# Patient Record
Sex: Female | Born: 1960 | Race: Black or African American | Hispanic: No | Marital: Single | Smoking: Former smoker
Health system: Southern US, Community
[De-identification: ages and names within clinical notes are randomized; demographics above are authoritative.]

## PROBLEM LIST (undated history)

## (undated) DIAGNOSIS — M549 Dorsalgia, unspecified: Secondary | ICD-10-CM

## (undated) DIAGNOSIS — I10 Essential (primary) hypertension: Secondary | ICD-10-CM

## (undated) DIAGNOSIS — E7800 Pure hypercholesterolemia, unspecified: Secondary | ICD-10-CM

## (undated) DIAGNOSIS — E119 Type 2 diabetes mellitus without complications: Secondary | ICD-10-CM

## (undated) HISTORY — PX: ABDOMINAL HYSTERECTOMY: SHX81

---

## 2011-05-11 DIAGNOSIS — M719 Bursopathy, unspecified: Secondary | ICD-10-CM

## 2011-05-11 HISTORY — DX: Bursopathy, unspecified: M71.9

## 2012-06-10 DIAGNOSIS — M47817 Spondylosis without myelopathy or radiculopathy, lumbosacral region: Secondary | ICD-10-CM

## 2012-06-10 DIAGNOSIS — F329 Major depressive disorder, single episode, unspecified: Secondary | ICD-10-CM

## 2012-06-10 DIAGNOSIS — G43909 Migraine, unspecified, not intractable, without status migrainosus: Secondary | ICD-10-CM

## 2012-06-10 DIAGNOSIS — M5137 Other intervertebral disc degeneration, lumbosacral region: Secondary | ICD-10-CM

## 2012-06-10 DIAGNOSIS — M159 Polyosteoarthritis, unspecified: Secondary | ICD-10-CM

## 2012-06-10 DIAGNOSIS — H905 Unspecified sensorineural hearing loss: Secondary | ICD-10-CM

## 2012-06-10 DIAGNOSIS — G5600 Carpal tunnel syndrome, unspecified upper limb: Secondary | ICD-10-CM

## 2012-06-10 DIAGNOSIS — F32A Depression, unspecified: Secondary | ICD-10-CM | POA: Insufficient documentation

## 2012-06-10 DIAGNOSIS — F341 Dysthymic disorder: Secondary | ICD-10-CM

## 2012-06-10 DIAGNOSIS — G4700 Insomnia, unspecified: Secondary | ICD-10-CM

## 2012-06-10 DIAGNOSIS — F411 Generalized anxiety disorder: Secondary | ICD-10-CM | POA: Insufficient documentation

## 2012-06-10 DIAGNOSIS — H8190 Unspecified disorder of vestibular function, unspecified ear: Secondary | ICD-10-CM

## 2012-06-10 DIAGNOSIS — M51379 Other intervertebral disc degeneration, lumbosacral region without mention of lumbar back pain or lower extremity pain: Secondary | ICD-10-CM

## 2012-06-10 DIAGNOSIS — M7650 Patellar tendinitis, unspecified knee: Secondary | ICD-10-CM | POA: Insufficient documentation

## 2012-06-10 DIAGNOSIS — M6530 Trigger finger, unspecified finger: Secondary | ICD-10-CM

## 2012-06-10 DIAGNOSIS — D126 Benign neoplasm of colon, unspecified: Secondary | ICD-10-CM

## 2012-06-10 DIAGNOSIS — M722 Plantar fascial fibromatosis: Secondary | ICD-10-CM

## 2012-06-10 DIAGNOSIS — Z79899 Other long term (current) drug therapy: Secondary | ICD-10-CM

## 2012-06-10 DIAGNOSIS — M79609 Pain in unspecified limb: Secondary | ICD-10-CM

## 2012-06-10 HISTORY — DX: Generalized anxiety disorder: F41.1

## 2012-06-10 HISTORY — DX: Major depressive disorder, single episode, unspecified: F32.9

## 2012-06-10 HISTORY — DX: Patellar tendinitis, unspecified knee: M76.50

## 2012-06-10 HISTORY — DX: Insomnia, unspecified: G47.00

## 2012-06-10 HISTORY — DX: Polyosteoarthritis, unspecified: M15.9

## 2012-06-10 HISTORY — DX: Benign neoplasm of colon, unspecified: D12.6

## 2012-06-10 HISTORY — DX: Spondylosis without myelopathy or radiculopathy, lumbosacral region: M47.817

## 2012-06-10 HISTORY — DX: Depression, unspecified: F32.A

## 2012-06-10 HISTORY — DX: Trigger finger, unspecified finger: M65.30

## 2012-06-10 HISTORY — DX: Dysthymic disorder: F34.1

## 2012-06-10 HISTORY — DX: Carpal tunnel syndrome, unspecified upper limb: G56.00

## 2012-06-10 HISTORY — DX: Plantar fascial fibromatosis: M72.2

## 2012-06-10 HISTORY — DX: Other long term (current) drug therapy: Z79.899

## 2012-06-10 HISTORY — DX: Migraine, unspecified, not intractable, without status migrainosus: G43.909

## 2012-06-10 HISTORY — DX: Unspecified disorder of vestibular function, unspecified ear: H81.90

## 2012-06-10 HISTORY — DX: Other intervertebral disc degeneration, lumbosacral region: M51.37

## 2012-06-10 HISTORY — DX: Other intervertebral disc degeneration, lumbosacral region without mention of lumbar back pain or lower extremity pain: M51.379

## 2012-06-10 HISTORY — DX: Unspecified sensorineural hearing loss: H90.5

## 2012-06-10 HISTORY — DX: Pain in unspecified limb: M79.609

## 2012-11-25 ENCOUNTER — Emergency Department (HOSPITAL_BASED_OUTPATIENT_CLINIC_OR_DEPARTMENT_OTHER): Payer: Medicare Other

## 2012-11-25 ENCOUNTER — Encounter (HOSPITAL_BASED_OUTPATIENT_CLINIC_OR_DEPARTMENT_OTHER): Payer: Self-pay | Admitting: Emergency Medicine

## 2012-11-25 ENCOUNTER — Emergency Department (HOSPITAL_BASED_OUTPATIENT_CLINIC_OR_DEPARTMENT_OTHER)
Admission: EM | Admit: 2012-11-25 | Discharge: 2012-11-25 | Disposition: A | Discharge status: Home / Self Care | Payer: Medicare Other | Attending: Emergency Medicine | Admitting: Emergency Medicine

## 2012-11-25 DIAGNOSIS — R0789 Other chest pain: Secondary | ICD-10-CM | POA: Insufficient documentation

## 2012-11-25 DIAGNOSIS — Z862 Personal history of diseases of the blood and blood-forming organs and certain disorders involving the immune mechanism: Secondary | ICD-10-CM | POA: Insufficient documentation

## 2012-11-25 DIAGNOSIS — F17200 Nicotine dependence, unspecified, uncomplicated: Secondary | ICD-10-CM | POA: Insufficient documentation

## 2012-11-25 DIAGNOSIS — Z8639 Personal history of other endocrine, nutritional and metabolic disease: Secondary | ICD-10-CM | POA: Insufficient documentation

## 2012-11-25 DIAGNOSIS — E119 Type 2 diabetes mellitus without complications: Secondary | ICD-10-CM | POA: Insufficient documentation

## 2012-11-25 DIAGNOSIS — R42 Dizziness and giddiness: Secondary | ICD-10-CM | POA: Insufficient documentation

## 2012-11-25 DIAGNOSIS — R110 Nausea: Secondary | ICD-10-CM | POA: Insufficient documentation

## 2012-11-25 HISTORY — DX: Type 2 diabetes mellitus without complications: E11.9

## 2012-11-25 HISTORY — DX: Pure hypercholesterolemia, unspecified: E78.00

## 2012-11-25 HISTORY — DX: Dorsalgia, unspecified: M54.9

## 2012-11-25 LAB — CBC WITH DIFFERENTIAL/PLATELET
Basophils Absolute: 0 10*3/uL (ref 0.0–0.1)
Eosinophils Absolute: 0.1 10*3/uL (ref 0.0–0.7)
Eosinophils Relative: 1 % (ref 0–5)
Lymphocytes Relative: 37 % (ref 12–46)
MCH: 30.5 pg (ref 26.0–34.0)
MCHC: 33.9 g/dL (ref 30.0–36.0)
MCV: 90 fL (ref 78.0–100.0)
Neutrophils Relative %: 55 % (ref 43–77)
Platelets: 201 10*3/uL (ref 150–400)
RBC: 4.3 MIL/uL (ref 3.87–5.11)
RDW: 14.6 % (ref 11.5–15.5)

## 2012-11-25 LAB — COMPREHENSIVE METABOLIC PANEL
ALT: 23 U/L (ref 0–35)
Albumin: 4.1 g/dL (ref 3.5–5.2)
BUN: 8 mg/dL (ref 6–23)
Creatinine, Ser: 0.9 mg/dL (ref 0.50–1.10)
GFR calc Af Amer: 84 mL/min — ABNORMAL LOW (ref >90)
Glucose, Bld: 89 mg/dL (ref 70–99)
Total Bilirubin: 0.3 mg/dL (ref 0.3–1.2)
Total Protein: 7.3 g/dL (ref 6.0–8.3)

## 2012-11-25 LAB — D-DIMER, QUANTITATIVE (NOT AT ARMC): D-Dimer, Quant: <0.27 ug/mL-FEU (ref 0.00–0.48)

## 2012-11-25 LAB — TROPONIN I: Troponin I: <0.3 ng/mL (ref <0.30)

## 2012-11-25 MED ORDER — NAPROXEN 500 MG PO TABS: 500.0000 mg | ORAL_TABLET | Freq: Two times a day (BID) | ORAL | Status: DC

## 2012-11-25 MED ORDER — SODIUM CHLORIDE 0.9 % IV BOLUS (SEPSIS)
1000.0000 mL | Freq: Once | INTRAVENOUS | Status: AC
  Administered 2012-11-25: 1000 mL via INTRAVENOUS

## 2012-11-25 NOTE — ED Provider Notes (Signed)
CSN: [id number]     Arrival date & time 11/25/12  1829 History  This chart was scribed for Sabrina B. Bernette Mayers, MD by Clydene Laming, ED Scribe. This patient was seen in room MH01/MH01 and the patient's care was started at [facility].   Chief Complaint  Patient presents with  . Chest Pain   HPI HPI Comments: Sabrina Swanson is a 52 y.o. female who presents to the Emergency Department complaining of intermittent right sided chest pain with associated nausea and light-headedness onset yesterday. Pt reports symptoms beginning while running errands. Pt went home and symptoms would go away but then return. Pt is not sure of any triggers. She states she has a hx of diabetes mellitus and has lost a few lbs recently. Pt ate just an hour before arrival and it did not improve or worsen the pain. She rates the pain right now as 5/10 and states it feels like someone is "sitting" on her chest. Pt travelled to [place] last weekend to attend a funeral. Pt does not have a hx of heart problems. She denies hypertension and sob. She has not had any gallbladder issues.   Past Medical History  Diagnosis Date  . High cholesterol   . Diabetes mellitus without complication    History reviewed. No pertinent past surgical history. No family history on file. History  Substance Use Topics  . Smoking status: Current Every Day Smoker -- 1.00 packs/day    Types: Cigarettes  . Smokeless tobacco: Not on file  . Alcohol Use: Yes     Comment: occasional   OB History   Grav Para Term Preterm Abortions TAB SAB Ect Mult Living                 Review of Systems  Cardiovascular: Positive for chest pain.  Gastrointestinal: Positive for nausea.  Neurological: Positive for light-headedness.  All other systems reviewed and are negative.    Allergies  Review of patient's allergies indicates no known allergies.  Home Medications   Current Outpatient Rx  Name  Route  Sig  Dispense  Refill  . UNKNOWN TO  PATIENT                There were no vitals taken for this visit. Physical Exam  Nursing note and vitals reviewed. Constitutional: She is oriented to person, place, and time. She appears well-developed and well-nourished.  HENT:  Head: Normocephalic and atraumatic.  Eyes: EOM are normal. Pupils are equal, round, and reactive to light.  Neck: Normal range of motion. Neck supple.  Cardiovascular: Normal rate, normal heart sounds and intact distal pulses.   Pulmonary/Chest: Effort normal and breath sounds normal.  Tenderness to right chest wall  Abdominal: Bowel sounds are normal. She exhibits no distension. There is no tenderness.  Musculoskeletal: Normal range of motion. She exhibits no edema and no tenderness.  Neurological: She is alert and oriented to person, place, and time. She has normal strength. No cranial nerve deficit or sensory deficit.  Skin: Skin is warm and dry. No rash noted.  Psychiatric: She has a normal mood and affect.    ED Course  Procedures (including critical care time) DIAGNOSTIC STUDIES: No vitals taken.  COORDINATION OF CARE: 6:48 PM- Discussed treatment plan with pt at bedside. Pt verbalized understanding and agreement with plan.   Labs Review Labs Reviewed  COMPREHENSIVE METABOLIC PANEL - Abnormal; Notable for the following:    Potassium 3.3 (*)    GFR calc non Af [name]  72 (*)    GFR calc Af Amer 84 (*)    All other components within normal limits  CBC WITH DIFFERENTIAL  TROPONIN I  D-DIMER, QUANTITATIVE   Imaging Review Dg Chest 2 View  11/25/2012   CLINICAL DATA:  Chest pain, shortness of breath.  EXAM: CHEST  2 VIEW  COMPARISON:  09/01/2012  FINDINGS: The heart size and mediastinal contours are within normal limits. Both lungs are clear. The visualized skeletal structures are unremarkable.  IMPRESSION: No active cardiopulmonary disease.   Electronically Signed   By: Charlett Nose M.D.   On: 11/25/2012 19:00    EKG Interpretation     Date/Time:  Saturday November 25 2012 18:39:15 EST Ventricular Rate:  68 PR Interval:  182 QRS Duration: 92 QT Interval:  406 QTC Calculation: 431 R Axis:   77 Text Interpretation:  Normal sinus rhythm Normal ECG No old tracing to compare Confirmed by [facility]  MD, Sabrina [phone]) on 11/25/2012 6:50:25 PM            MDM   1. Atypical chest pain     Pt with atypical, reproducible right sided chest pain, normal labs and EKG here. No concern for ACS or PE. Advised NSAIDs, rest and PCP followup.   I personally performed the services described in this documentation, which was scribed in my presence. The recorded information has been reviewed and is accurate.      Sabrina B. Bernette Mayers, MD 11/25/12 2201

## 2012-11-25 NOTE — ED Notes (Signed)
Patient has been experiencing intermittent episodes of right sided chest pain with nausea. Reports feeling light headed with same, non-radiating, no shortness of breath. No change with inspiration

## 2012-11-25 NOTE — ED Notes (Signed)
MD at bedside. 

## 2013-03-06 DIAGNOSIS — F17200 Nicotine dependence, unspecified, uncomplicated: Secondary | ICD-10-CM

## 2013-03-06 DIAGNOSIS — E7800 Pure hypercholesterolemia, unspecified: Secondary | ICD-10-CM

## 2013-03-06 HISTORY — DX: Nicotine dependence, unspecified, uncomplicated: F17.200

## 2013-03-06 HISTORY — DX: Pure hypercholesterolemia, unspecified: E78.00

## 2013-03-21 DIAGNOSIS — H9319 Tinnitus, unspecified ear: Secondary | ICD-10-CM | POA: Insufficient documentation

## 2013-03-21 HISTORY — DX: Tinnitus, unspecified ear: H93.19

## 2013-03-28 ENCOUNTER — Encounter (HOSPITAL_BASED_OUTPATIENT_CLINIC_OR_DEPARTMENT_OTHER): Payer: Self-pay | Admitting: Emergency Medicine

## 2013-03-28 ENCOUNTER — Emergency Department (HOSPITAL_BASED_OUTPATIENT_CLINIC_OR_DEPARTMENT_OTHER)
Admission: EM | Admit: 2013-03-28 | Discharge: 2013-03-28 | Disposition: A | Discharge status: Home / Self Care | Payer: Medicare Other | Attending: Emergency Medicine | Admitting: Emergency Medicine

## 2013-03-28 DIAGNOSIS — B9689 Other specified bacterial agents as the cause of diseases classified elsewhere: Secondary | ICD-10-CM | POA: Insufficient documentation

## 2013-03-28 DIAGNOSIS — Z8739 Personal history of other diseases of the musculoskeletal system and connective tissue: Secondary | ICD-10-CM | POA: Insufficient documentation

## 2013-03-28 DIAGNOSIS — Z79899 Other long term (current) drug therapy: Secondary | ICD-10-CM | POA: Insufficient documentation

## 2013-03-28 DIAGNOSIS — N760 Acute vaginitis: Secondary | ICD-10-CM | POA: Insufficient documentation

## 2013-03-28 DIAGNOSIS — Z792 Long term (current) use of antibiotics: Secondary | ICD-10-CM | POA: Insufficient documentation

## 2013-03-28 DIAGNOSIS — N390 Urinary tract infection, site not specified: Secondary | ICD-10-CM

## 2013-03-28 DIAGNOSIS — E7800 Pure hypercholesterolemia, unspecified: Secondary | ICD-10-CM | POA: Insufficient documentation

## 2013-03-28 DIAGNOSIS — Z791 Long term (current) use of non-steroidal anti-inflammatories (NSAID): Secondary | ICD-10-CM | POA: Insufficient documentation

## 2013-03-28 DIAGNOSIS — F17200 Nicotine dependence, unspecified, uncomplicated: Secondary | ICD-10-CM | POA: Insufficient documentation

## 2013-03-28 DIAGNOSIS — A499 Bacterial infection, unspecified: Secondary | ICD-10-CM | POA: Insufficient documentation

## 2013-03-28 DIAGNOSIS — E119 Type 2 diabetes mellitus without complications: Secondary | ICD-10-CM | POA: Insufficient documentation

## 2013-03-28 LAB — WET PREP, GENITAL
TRICH WET PREP: NONE SEEN
Yeast Wet Prep HPF POC: NONE SEEN

## 2013-03-28 LAB — URINE MICROSCOPIC-ADD ON

## 2013-03-28 LAB — URINALYSIS, ROUTINE W REFLEX MICROSCOPIC
Bilirubin Urine: NEGATIVE
Glucose, UA: NEGATIVE mg/dL
HGB URINE DIPSTICK: NEGATIVE
KETONES UR: NEGATIVE mg/dL
Nitrite: NEGATIVE
Protein, ur: NEGATIVE mg/dL
SPECIFIC GRAVITY, URINE: 1.019 (ref 1.005–1.030)
Urobilinogen, UA: 0.2 mg/dL (ref 0.0–1.0)
pH: 5 (ref 5.0–8.0)

## 2013-03-28 MED ORDER — AZITHROMYCIN 250 MG PO TABS
1000.0000 mg | ORAL_TABLET | Freq: Once | ORAL | Status: AC
  Administered 2013-03-28: 1000 mg via ORAL
  Filled 2013-03-28: qty 4

## 2013-03-28 MED ORDER — CEPHALEXIN 500 MG PO CAPS: 500.0000 mg | ORAL_CAPSULE | Freq: Four times a day (QID) | ORAL | Status: DC

## 2013-03-28 MED ORDER — METRONIDAZOLE 500 MG PO TABS: 500.0000 mg | ORAL_TABLET | Freq: Two times a day (BID) | ORAL | Status: DC

## 2013-03-28 MED ORDER — CEFTRIAXONE SODIUM 250 MG IJ SOLR
250.0000 mg | Freq: Once | INTRAMUSCULAR | Status: AC
  Administered 2013-03-28: 250 mg via INTRAMUSCULAR
  Filled 2013-03-28: qty 250

## 2013-03-28 NOTE — Discharge Instructions (Signed)
Bacterial Vaginosis °Bacterial vaginosis is a vaginal infection that occurs when the normal balance of bacteria in the vagina is disrupted. It results from an overgrowth of certain bacteria. This is the most common vaginal infection in women of childbearing age. Treatment is important to prevent complications, especially in pregnant women, as it can cause a premature delivery. °CAUSES  °Bacterial vaginosis is caused by an increase in harmful bacteria that are normally present in smaller amounts in the vagina. Several different kinds of bacteria can cause bacterial vaginosis. However, the reason that the condition develops is not fully understood. °RISK FACTORS °Certain activities or behaviors can put you at an increased risk of developing bacterial vaginosis, including: °· Having a new sex partner or multiple sex partners. °· Douching. °· Using an intrauterine device (IUD) for contraception. °Women do not get bacterial vaginosis from toilet seats, bedding, swimming pools, or contact with objects around them. °SIGNS AND SYMPTOMS  °Some women with bacterial vaginosis have no signs or symptoms. Common symptoms include: °· Grey vaginal discharge. °· A fishlike odor with discharge, especially after sexual intercourse. °· Itching or burning of the vagina and vulva. °· Burning or pain with urination. °DIAGNOSIS  °Your health care provider will take a medical history and examine the vagina for signs of bacterial vaginosis. A sample of vaginal fluid may be taken. Your health care provider will look at this sample under a microscope to check for bacteria and abnormal cells. A vaginal pH test may also be done.  °TREATMENT  °Bacterial vaginosis may be treated with antibiotic medicines. These may be given in the form of a pill or a vaginal cream. A second round of antibiotics may be prescribed if the condition comes back after treatment.  °HOME CARE INSTRUCTIONS  °· Only take over-the-counter or prescription medicines as  directed by your health care provider. °· If antibiotic medicine was prescribed, take it as directed. Make sure you finish it even if you start to feel better. °· Do not have sex until treatment is completed. °· Tell all sexual partners that you have a vaginal infection. They should see their health care provider and be treated if they have problems, such as a mild rash or itching. °· Practice safe sex by using condoms and only having one sex partner. °SEEK MEDICAL CARE IF:  °· Your symptoms are not improving after 3 days of treatment. °· You have increased discharge or pain. °· You have a fever. °MAKE SURE YOU:  °· Understand these instructions. °· Will watch your condition. °· Will get help right away if you are not doing well or get worse. °FOR MORE INFORMATION  °Centers for Disease Control and Prevention, Division of STD Prevention: www.cdc.gov/std °American Sexual Health Association (ASHA): www.ashastd.org  °Document Released: 12/21/2004 Document Revised: 10/11/2012 Document Reviewed: 08/02/2012 °ExitCare® Patient Information ©2014 [employer]. °Urinary Tract Infection °Urinary tract infections (UTIs) can develop anywhere along your urinary tract. Your urinary tract is your body's drainage system for removing wastes and extra water. Your urinary tract includes two kidneys, two ureters, a bladder, and a urethra. Your kidneys are a pair of bean-shaped organs. Each kidney is about the size of your fist. They are located below your ribs, one on each side of your spine. °CAUSES °Infections are caused by microbes, which are microscopic organisms, including fungi, viruses, and bacteria. These organisms are so small that they can only be seen through a microscope. Bacteria are the microbes that most commonly cause UTIs. °SYMPTOMS  °Symptoms of UTIs   may vary by age and gender of the patient and by the location of the infection. Symptoms in young women typically include a frequent and intense urge to urinate and a  painful, burning feeling in the bladder or urethra during urination. Older women and men are more likely to be tired, shaky, and weak and have muscle aches and abdominal pain. A fever may mean the infection is in your kidneys. Other symptoms of a kidney infection include pain in your back or sides below the ribs, nausea, and vomiting. °DIAGNOSIS °To diagnose a UTI, your caregiver will ask you about your symptoms. Your caregiver also will ask to provide a urine sample. The urine sample will be tested for bacteria and white blood cells. White blood cells are made by your body to help fight infection. °TREATMENT  °Typically, UTIs can be treated with medication. Because most UTIs are caused by a bacterial infection, they usually can be treated with the use of antibiotics. The choice of antibiotic and length of treatment depend on your symptoms and the type of bacteria causing your infection. °HOME CARE INSTRUCTIONS °· If you were prescribed antibiotics, take them exactly as your caregiver instructs you. Finish the medication even if you feel better after you have only taken some of the medication. °· Drink enough water and fluids to keep your urine clear or pale yellow. °· Avoid caffeine, tea, and carbonated beverages. They tend to irritate your bladder. °· Empty your bladder often. Avoid holding urine for long periods of time. °· Empty your bladder before and after sexual intercourse. °· After a bowel movement, women should cleanse from front to back. Use each tissue only once. °SEEK MEDICAL CARE IF:  °· You have back pain. °· You develop a fever. °· Your symptoms do not begin to resolve within 3 days. °SEEK IMMEDIATE MEDICAL CARE IF:  °· You have severe back pain or lower abdominal pain. °· You develop chills. °· You have nausea or vomiting. °· You have continued burning or discomfort with urination. °MAKE SURE YOU:  °· Understand these instructions. °· Will watch your condition. °· Will get help right away if you are  not doing well or get worse. °Document Released: 09/30/2004 Document Revised: 06/22/2011 Document Reviewed: 01/29/2011 °ExitCare® Patient Information ©2014 [employer]. ° °

## 2013-03-28 NOTE — ED Notes (Signed)
Provided pt with Drink and Snack

## 2013-03-28 NOTE — ED Notes (Signed)
Pt c/o suprapubic pressure and vaginal discharge x 3 days.

## 2013-03-28 NOTE — ED Provider Notes (Addendum)
CSN: [id number]     Arrival date & time 03/28/13  4098 History   First MD Initiated Contact with Patient 03/28/13 1014     Chief Complaint  Patient presents with  . Vaginal Discharge     (Consider location/radiation/quality/duration/timing/severity/associated sxs/prior Treatment) HPI Comments: The patient has been experiencing yellow-brown vaginal discharge for 3 days. She also reports that she has been experiencing urinary frequency with mild dysuria. She denies back pain, nausea, vomiting, fever.  Patient is a 53 y.o. female presenting with vaginal discharge.  Vaginal Discharge Associated symptoms: dysuria     Past Medical History  Diagnosis Date  . High cholesterol   . Diabetes mellitus without complication   . Back pain    History reviewed. No pertinent past surgical history. No family history on file. History  Substance Use Topics  . Smoking status: Current Every Day Smoker -- 1.00 packs/day    Types: Cigarettes  . Smokeless tobacco: Not on file  . Alcohol Use: Yes     Comment: occasional   OB History   Grav Para Term Preterm Abortions TAB SAB Ect Mult Living                 Review of Systems  Genitourinary: Positive for dysuria, frequency and vaginal discharge.  All other systems reviewed and are negative.      Allergies  Review of patient's allergies indicates no known allergies.  Home Medications   Current Outpatient Rx  Name  Route  Sig  Dispense  Refill  . OxyCODONE (OXYCONTIN) 10 mg T12A 12 hr tablet   Oral   Take 10 mg by mouth every 12 (twelve) hours.         . pravastatin (PRAVACHOL) 80 MG tablet   Oral   Take 80 mg by mouth daily.         . cephALEXin (KEFLEX) 500 MG capsule   Oral   Take 1 capsule (500 mg total) by mouth 4 (four) times daily.   28 capsule   0   . metroNIDAZOLE (FLAGYL) 500 MG tablet   Oral   Take 1 tablet (500 mg total) by mouth 2 (two) times daily. One po bid x 7 days   14 tablet   0   . naproxen (NAPROSYN)  500 MG tablet   Oral   Take 1 tablet (500 mg total) by mouth 2 (two) times daily.   30 tablet   0   . oxyCODONE-acetaminophen (PERCOCET/ROXICET) 5-325 MG per tablet   Oral   Take 1 tablet by mouth every 4 (four) hours as needed for severe pain.         Marland Kitchen UNKNOWN TO PATIENT                BP 139/91  Pulse 77  Temp(Src) 98.1 F (36.7 C) (Oral)  Resp 18  SpO2 100% Physical Exam  Constitutional: She is oriented to person, place, and time. She appears well-developed and well-nourished. No distress.  HENT:  Head: Normocephalic and atraumatic.  Right Ear: Hearing normal.  Left Ear: Hearing normal.  Nose: Nose normal.  Mouth/Throat: Oropharynx is clear and moist and mucous membranes are normal.  Eyes: Conjunctivae and EOM are normal. Pupils are equal, round, and reactive to light.  Neck: Normal range of motion. Neck supple.  Cardiovascular: Regular rhythm, S1 normal and S2 normal.  Exam reveals no gallop and no friction rub.   No murmur heard. Pulmonary/Chest: Effort normal and breath sounds normal. No respiratory distress.  She exhibits no tenderness.  Abdominal: Soft. Normal appearance and bowel sounds are normal. There is no hepatosplenomegaly. There is no tenderness. There is no rebound, no guarding, no tenderness at McBurney's point and negative Murphy's sign. No hernia.  Genitourinary: Cervix exhibits discharge. Left adnexum displays mass and tenderness.  Musculoskeletal: Normal range of motion.  Neurological: She is alert and oriented to person, place, and time. She has normal strength. No cranial nerve deficit or sensory deficit. Coordination normal. GCS eye subscore is 4. GCS verbal subscore is 5. GCS motor subscore is 6.  Skin: Skin is warm, dry and intact. No rash noted. No cyanosis.  Psychiatric: She has a normal mood and affect. Her speech is normal and behavior is normal. Thought content normal.    ED Course  Procedures (including critical care time) Labs  Review Labs Reviewed  WET PREP, GENITAL - Abnormal; Notable for the following:    Clue Cells Wet Prep HPF POC FEW (*)    WBC, Wet Prep HPF POC MANY (*)    All other components within normal limits  URINALYSIS, ROUTINE W REFLEX MICROSCOPIC - Abnormal; Notable for the following:    Leukocytes, UA MODERATE (*)    All other components within normal limits  URINE MICROSCOPIC-ADD ON - Abnormal; Notable for the following:    Bacteria, UA MANY (*)    All other components within normal limits  GC/CHLAMYDIA PROBE AMP   Imaging Review No results found.   EKG Interpretation None      MDM   Final diagnoses:  UTI (lower urinary tract infection)  Bacterial vaginosis    Patient presents with dysuria as well as pelvic discharge. Dialysis is consistent with infection. GC and Chlamydia are pending. Patient treated empirically with Rocephin and Zithromax. We'll also treat with Flagyl for clue cells and Keflex for the UTI.    Orpah Greek, MD 03/28/13 [place], MD 03/28/13 [phone]

## 2013-03-29 LAB — GC/CHLAMYDIA PROBE AMP
CT Probe RNA: NEGATIVE
GC Probe RNA: NEGATIVE

## 2013-05-10 ENCOUNTER — Encounter (HOSPITAL_BASED_OUTPATIENT_CLINIC_OR_DEPARTMENT_OTHER): Payer: Self-pay | Admitting: Emergency Medicine

## 2013-05-10 ENCOUNTER — Emergency Department (HOSPITAL_BASED_OUTPATIENT_CLINIC_OR_DEPARTMENT_OTHER)
Admission: EM | Admit: 2013-05-10 | Discharge: 2013-05-11 | Disposition: A | Discharge status: Home / Self Care | Payer: Medicare Other | Attending: Emergency Medicine | Admitting: Emergency Medicine

## 2013-05-10 ENCOUNTER — Emergency Department (HOSPITAL_COMMUNITY): Payer: Medicare Other

## 2013-05-10 DIAGNOSIS — H5500 Unspecified nystagmus: Secondary | ICD-10-CM | POA: Insufficient documentation

## 2013-05-10 DIAGNOSIS — F17200 Nicotine dependence, unspecified, uncomplicated: Secondary | ICD-10-CM | POA: Diagnosis not present

## 2013-05-10 DIAGNOSIS — R112 Nausea with vomiting, unspecified: Secondary | ICD-10-CM | POA: Diagnosis not present

## 2013-05-10 DIAGNOSIS — E119 Type 2 diabetes mellitus without complications: Secondary | ICD-10-CM | POA: Insufficient documentation

## 2013-05-10 DIAGNOSIS — M549 Dorsalgia, unspecified: Secondary | ICD-10-CM | POA: Diagnosis not present

## 2013-05-10 DIAGNOSIS — H9319 Tinnitus, unspecified ear: Secondary | ICD-10-CM | POA: Insufficient documentation

## 2013-05-10 DIAGNOSIS — R0789 Other chest pain: Secondary | ICD-10-CM | POA: Insufficient documentation

## 2013-05-10 DIAGNOSIS — Z79899 Other long term (current) drug therapy: Secondary | ICD-10-CM | POA: Insufficient documentation

## 2013-05-10 DIAGNOSIS — I6789 Other cerebrovascular disease: Secondary | ICD-10-CM | POA: Diagnosis not present

## 2013-05-10 DIAGNOSIS — E7800 Pure hypercholesterolemia, unspecified: Secondary | ICD-10-CM | POA: Diagnosis not present

## 2013-05-10 DIAGNOSIS — R42 Dizziness and giddiness: Secondary | ICD-10-CM | POA: Diagnosis not present

## 2013-05-10 LAB — CBC WITH DIFFERENTIAL/PLATELET
BASOS PCT: 0 % (ref 0–1)
Basophils Absolute: 0 10*3/uL (ref 0.0–0.1)
EOS ABS: 0.1 10*3/uL (ref 0.0–0.7)
Eosinophils Relative: 1 % (ref 0–5)
HEMATOCRIT: 39.4 % (ref 36.0–46.0)
HEMOGLOBIN: 13.4 g/dL (ref 12.0–15.0)
Lymphocytes Relative: 55 % — ABNORMAL HIGH (ref 12–46)
Lymphs Abs: 3.2 10*3/uL (ref 0.7–4.0)
MCH: 31.2 pg (ref 26.0–34.0)
MCHC: 34 g/dL (ref 30.0–36.0)
MCV: 91.8 fL (ref 78.0–100.0)
MONO ABS: 0.4 10*3/uL (ref 0.1–1.0)
MONOS PCT: 6 % (ref 3–12)
Neutro Abs: 2.2 10*3/uL (ref 1.7–7.7)
Neutrophils Relative %: 37 % — ABNORMAL LOW (ref 43–77)
Platelets: 190 10*3/uL (ref 150–400)
RBC: 4.29 MIL/uL (ref 3.87–5.11)
RDW: 14.6 % (ref 11.5–15.5)
WBC: 5.8 10*3/uL (ref 4.0–10.5)

## 2013-05-10 LAB — URINALYSIS, ROUTINE W REFLEX MICROSCOPIC
Bilirubin Urine: NEGATIVE
Glucose, UA: NEGATIVE mg/dL
Hgb urine dipstick: NEGATIVE
KETONES UR: NEGATIVE mg/dL
NITRITE: NEGATIVE
PH: 8 (ref 5.0–8.0)
Protein, ur: NEGATIVE mg/dL
Specific Gravity, Urine: 1.012 (ref 1.005–1.030)
Urobilinogen, UA: 0.2 mg/dL (ref 0.0–1.0)

## 2013-05-10 LAB — URINE MICROSCOPIC-ADD ON

## 2013-05-10 LAB — BASIC METABOLIC PANEL
BUN: 10 mg/dL (ref 6–23)
CO2: 28 mEq/L (ref 19–32)
CREATININE: 0.8 mg/dL (ref 0.50–1.10)
Calcium: 9.1 mg/dL (ref 8.4–10.5)
Chloride: 105 mEq/L (ref 96–112)
GFR, EST NON AFRICAN AMERICAN: 83 mL/min — AB (ref >90)
Glucose, Bld: 115 mg/dL — ABNORMAL HIGH (ref 70–99)
Potassium: 4 mEq/L (ref 3.7–5.3)
Sodium: 144 mEq/L (ref 137–147)

## 2013-05-10 MED ORDER — DIAZEPAM 5 MG/ML IJ SOLN
5.0000 mg | Freq: Once | INTRAMUSCULAR | Status: AC
  Administered 2013-05-10: 5 mg via INTRAVENOUS
  Filled 2013-05-10: qty 2

## 2013-05-10 NOTE — ED Notes (Signed)
Unable to void at present time. 

## 2013-05-10 NOTE — ED Notes (Signed)
Patient transported to MRI 

## 2013-05-10 NOTE — ED Notes (Signed)
MRI dept notified of Pt. Arrival to POD -C.

## 2013-05-10 NOTE — ED Provider Notes (Signed)
CSN: [id number]     Arrival date & time 05/10/13  1456 History   First MD Initiated Contact with Patient 05/10/13 1555     Chief Complaint  Patient presents with  . Dizziness     (Consider location/radiation/quality/duration/timing/severity/associated sxs/prior Treatment) Patient is a 53 y.o. female presenting with dizziness.  Dizziness Quality:  Room spinning, vertigo and imbalance Severity:  Moderate Onset quality:  Gradual Duration:  3 days Timing:  Constant Progression:  Unchanged Chronicity:  New Context comment:  Began initially while working in her garden two days ago.  she went to the ED    Relieved by: meclizine, but has not tried any today. Worsened by:  Standing up Associated symptoms: nausea, tinnitus (left ear, mild) and vomiting (two days ago)   Associated symptoms: no chest pain and no shortness of breath     Past Medical History  Diagnosis Date  . High cholesterol   . Diabetes mellitus without complication   . Back pain    Past Surgical History  Procedure Laterality Date  . Abdominal hysterectomy    . Cesarean section     No family history on file. History  Substance Use Topics  . Smoking status: Current Every Day Smoker -- 1.00 packs/day    Types: Cigarettes  . Smokeless tobacco: Not on file  . Alcohol Use: Yes     Comment: occasional   OB History   Grav Para Term Preterm Abortions TAB SAB Ect Mult Living                 Review of Systems  HENT: Positive for tinnitus (left ear, mild).   Respiratory: Negative for shortness of breath.   Cardiovascular: Negative for chest pain.  Gastrointestinal: Positive for nausea and vomiting (two days ago).  Neurological: Positive for dizziness.  All other systems reviewed and are negative.     Allergies  Review of patient's allergies indicates no known allergies.  Home Medications   Prior to Admission medications   Medication Sig Start Date End Date Taking? Authorizing Provider  OxyCODONE  (OXYCONTIN) 10 mg T12A 12 hr tablet Take 10 mg by mouth every 12 (twelve) hours.   Yes Historical Provider, MD  oxyCODONE-acetaminophen (PERCOCET/ROXICET) 5-325 MG per tablet Take 1 tablet by mouth every 4 (four) hours as needed for severe pain.   Yes Historical Provider, MD  cephALEXin (KEFLEX) 500 MG capsule Take 1 capsule (500 mg total) by mouth 4 (four) times daily. 03/28/13   Orpah Greek, MD  metroNIDAZOLE (FLAGYL) 500 MG tablet Take 1 tablet (500 mg total) by mouth 2 (two) times daily. One po bid x 7 days 03/28/13   Orpah Greek, MD  naproxen (NAPROSYN) 500 MG tablet Take 1 tablet (500 mg total) by mouth 2 (two) times daily. 11/25/12   Charles B. Karle Starch, MD  pravastatin (PRAVACHOL) 80 MG tablet Take 80 mg by mouth daily.    Historical Provider, MD  UNKNOWN TO PATIENT     Historical Provider, MD   BP 182/81  Pulse 70  Temp(Src) 98.7 F (37.1 C) (Oral)  Resp 18  Wt 160 lb (72.576 kg)  SpO2 98% Physical Exam  Nursing note and vitals reviewed. Constitutional: She is oriented to person, place, and time. She appears well-developed and well-nourished. No distress.  HENT:  Head: Normocephalic and atraumatic.  Mouth/Throat: Oropharynx is clear and moist.  Eyes: Conjunctivae are normal. Pupils are equal, round, and reactive to light. No scleral icterus.  Neck: Neck supple.  Cardiovascular:  Normal rate, regular rhythm, normal heart sounds and intact distal pulses.   No murmur heard. Pulmonary/Chest: Effort normal and breath sounds normal. No stridor. No respiratory distress. She has no rales.  Abdominal: Soft. Bowel sounds are normal. She exhibits no distension. There is no tenderness.  Musculoskeletal: Normal range of motion.  Neurological: She is alert and oriented to person, place, and time. She has normal strength. No cranial nerve deficit or sensory deficit. Gait (ataxic.) abnormal. Coordination (normal FTN, no truncal ataxia) normal. GCS eye subscore is 4. GCS verbal  subscore is 5. GCS motor subscore is 6.  Right sided nystagmus  Skin: Skin is warm and dry. No rash noted.  Psychiatric: She has a normal mood and affect. Her behavior is normal.    ED Course  Procedures (including critical care time) Labs Review Labs Reviewed  CBC WITH DIFFERENTIAL - Abnormal; Notable for the following:    Neutrophils Relative % 37 (*)    Lymphocytes Relative 55 (*)    All other components within normal limits  BASIC METABOLIC PANEL - Abnormal; Notable for the following:    Glucose, Bld 115 (*)    GFR calc non Af Amer 83 (*)    All other components within normal limits  URINALYSIS, ROUTINE W REFLEX MICROSCOPIC - Abnormal; Notable for the following:    Leukocytes, UA TRACE (*)    All other components within normal limits  URINE MICROSCOPIC-ADD ON    Imaging Review No results found.   EKG Interpretation None      MDM   Final diagnoses:  Vertigo    53 year old female presenting with room spinning sensation and feeling of being off balance. Symptoms started several days ago, and have persisted. Her neurologic examination notable for ataxia. Her ataxia and feeling of off balance did not improve after IV Valium. Due to duration of symptoms and persistence of symptoms despite medication, feels she needs MRI to rule out central vertigo. Transferred from here to [facility] for this test.    Arbie Cookey, MD 05/11/13 [phone]

## 2013-05-10 NOTE — ED Notes (Signed)
Dizziness onset started Tuesday after mowing grass. Pt seen at [facility] Tuesday and given medication for dizziness.pt states now having feelings of being off balance.

## 2013-05-11 MED ORDER — ONDANSETRON 8 MG PO TBDP: 8.0000 mg | ORAL_TABLET | Freq: Three times a day (TID) | ORAL | Status: DC | PRN

## 2013-05-11 MED ORDER — DIAZEPAM 5 MG PO TABS: 5.0000 mg | ORAL_TABLET | Freq: Three times a day (TID) | ORAL | Status: DC | PRN

## 2013-05-11 NOTE — ED Provider Notes (Signed)
12:51 AM Patient doing much better.  Home with Valium.  This is likely peripheral vertigo.  MRI without acute stroke or other significant abnormalities.  Patient was ambulatory in the ER.  Mr Angiogram Head Wo Contrast  05/11/2013   CLINICAL DATA:  Intermittent right chest pain with associated nausea and dizziness.  EXAM: MRI HEAD WITHOUT CONTRAST  MRA HEAD WITHOUT CONTRAST  TECHNIQUE: Multiplanar, multiecho pulse sequences of the brain and surrounding structures were obtained without intravenous contrast. Angiographic images of the head were obtained using MRA technique without contrast.  COMPARISON:  Prior CT performed earlier on the same day.  FINDINGS: MRI HEAD FINDINGS  The CSF containing spaces are within normal limits for patient age. Scattered subcentimeter T2/FLAIR hyperintensities seen within the deep white matter of both cerebral hemispheres, nonspecific, but likely related to mild chronic microvascular ischemic disease. No mass lesion, midline shift, or extra-axial fluid collection. Ventricles are normal in size without evidence of hydrocephalus.  No diffusion-weighted signal abnormality is identified to suggest acute intracranial infarct. Gray-white matter differentiation is maintained. Normal flow voids are seen within the intracranial vasculature. No intracranial hemorrhage identified.  The cervicomedullary junction is normal. Pituitary gland is within normal limits. Pituitary stalk is midline. The globes and optic nerves demonstrate a normal appearance with normal signal intensity.  The bone marrow signal intensity is normal. Calvarium is intact. Visualized upper cervical spine is within normal limits.  Scalp soft tissues are unremarkable.  Paranasal sinuses are clear.  No mastoid effusion.  MRA HEAD FINDINGS  The visualized portions of the distal cervical internal carotid arteries are widely patent with antegrade flow. The petrous, cavernous, and supra clinoid segments are symmetric in caliber  bilaterally. A1 segments, anterior communicating artery, and anterior cerebral arteries are widely patent. Middle cerebral arteries are widely patent with antegrade flow without high-grade flow-limiting stenosis or proximal branch occlusion. Distal MCA branches are symmetric bilaterally. No intracranial aneurysm within the anterior circulation.  The vertebral arteries are widely patent with antegrade flow. The posterior inferior cerebral arteries are normal. Vertebrobasilar junction and basilar artery are widely patent with antegrade flow without evidence of basilar tip stenosis or aneurysm. Posterior cerebral arteries are normal bilaterally. The superior cerebellar arteries and anterior inferior cerebellar arteries are widely patent bilaterally. No intracranial aneurysm within the posterior circulation.  IMPRESSION: MRI HEAD IMPRESSION:  1. No acute intracranial infarct or other abnormality identified. 2. Nonspecific T2/FLAIR hyperintensities involving the deep white matter of both cerebral hemispheres, nonspecific, but likely related to mild chronic microvascular ischemic disease.  MRA HEAD IMPRESSION:  Normal MRA of the brain.   Electronically Signed   By: Jeannine Boga M.D.   On: 05/11/2013 00:10   Mr Brain Wo Contrast  05/11/2013   CLINICAL DATA:  Intermittent right chest pain with associated nausea and dizziness.  EXAM: MRI HEAD WITHOUT CONTRAST  MRA HEAD WITHOUT CONTRAST  TECHNIQUE: Multiplanar, multiecho pulse sequences of the brain and surrounding structures were obtained without intravenous contrast. Angiographic images of the head were obtained using MRA technique without contrast.  COMPARISON:  Prior CT performed earlier on the same day.  FINDINGS: MRI HEAD FINDINGS  The CSF containing spaces are within normal limits for patient age. Scattered subcentimeter T2/FLAIR hyperintensities seen within the deep white matter of both cerebral hemispheres, nonspecific, but likely related to mild chronic  microvascular ischemic disease. No mass lesion, midline shift, or extra-axial fluid collection. Ventricles are normal in size without evidence of hydrocephalus.  No diffusion-weighted signal abnormality is identified to  suggest acute intracranial infarct. Gray-white matter differentiation is maintained. Normal flow voids are seen within the intracranial vasculature. No intracranial hemorrhage identified.  The cervicomedullary junction is normal. Pituitary gland is within normal limits. Pituitary stalk is midline. The globes and optic nerves demonstrate a normal appearance with normal signal intensity.  The bone marrow signal intensity is normal. Calvarium is intact. Visualized upper cervical spine is within normal limits.  Scalp soft tissues are unremarkable.  Paranasal sinuses are clear.  No mastoid effusion.  MRA HEAD FINDINGS  The visualized portions of the distal cervical internal carotid arteries are widely patent with antegrade flow. The petrous, cavernous, and supra clinoid segments are symmetric in caliber bilaterally. A1 segments, anterior communicating artery, and anterior cerebral arteries are widely patent. Middle cerebral arteries are widely patent with antegrade flow without high-grade flow-limiting stenosis or proximal branch occlusion. Distal MCA branches are symmetric bilaterally. No intracranial aneurysm within the anterior circulation.  The vertebral arteries are widely patent with antegrade flow. The posterior inferior cerebral arteries are normal. Vertebrobasilar junction and basilar artery are widely patent with antegrade flow without evidence of basilar tip stenosis or aneurysm. Posterior cerebral arteries are normal bilaterally. The superior cerebellar arteries and anterior inferior cerebellar arteries are widely patent bilaterally. No intracranial aneurysm within the posterior circulation.  IMPRESSION: MRI HEAD IMPRESSION:  1. No acute intracranial infarct or other abnormality identified. 2.  Nonspecific T2/FLAIR hyperintensities involving the deep white matter of both cerebral hemispheres, nonspecific, but likely related to mild chronic microvascular ischemic disease.  MRA HEAD IMPRESSION:  Normal MRA of the brain.   Electronically Signed   By: Jeannine Boga M.D.   On: 05/11/2013 00:10  I personally reviewed the imaging tests through PACS system I reviewed available ER/hospitalization records through the [place], MD 05/11/13 [phone]

## 2013-05-11 NOTE — Discharge Instructions (Signed)

## 2013-05-11 NOTE — ED Notes (Signed)
Patient ambulated with no dizziness

## 2013-05-16 DIAGNOSIS — L659 Nonscarring hair loss, unspecified: Secondary | ICD-10-CM

## 2013-05-16 DIAGNOSIS — J4489 Other specified chronic obstructive pulmonary disease: Secondary | ICD-10-CM

## 2013-05-16 DIAGNOSIS — J449 Chronic obstructive pulmonary disease, unspecified: Secondary | ICD-10-CM

## 2013-05-16 HISTORY — DX: Nonscarring hair loss, unspecified: L65.9

## 2013-05-16 HISTORY — DX: Other specified chronic obstructive pulmonary disease: J44.89

## 2013-05-16 HISTORY — DX: Chronic obstructive pulmonary disease, unspecified: J44.9

## 2013-06-14 DIAGNOSIS — R42 Dizziness and giddiness: Secondary | ICD-10-CM

## 2013-06-14 HISTORY — DX: Dizziness and giddiness: R42

## 2014-03-04 DIAGNOSIS — IMO0002 Reserved for concepts with insufficient information to code with codable children: Secondary | ICD-10-CM | POA: Insufficient documentation

## 2014-03-04 HISTORY — DX: Reserved for concepts with insufficient information to code with codable children: IMO0002

## 2014-05-25 DIAGNOSIS — D72820 Lymphocytosis (symptomatic): Secondary | ICD-10-CM

## 2014-05-25 HISTORY — DX: Lymphocytosis (symptomatic): D72.820

## 2014-10-21 DIAGNOSIS — E1140 Type 2 diabetes mellitus with diabetic neuropathy, unspecified: Secondary | ICD-10-CM

## 2014-10-21 HISTORY — DX: Type 2 diabetes mellitus with diabetic neuropathy, unspecified: E11.40

## 2014-10-23 DIAGNOSIS — R229 Localized swelling, mass and lump, unspecified: Secondary | ICD-10-CM | POA: Insufficient documentation

## 2014-10-23 DIAGNOSIS — M79674 Pain in right toe(s): Secondary | ICD-10-CM

## 2014-10-23 DIAGNOSIS — M79675 Pain in left toe(s): Secondary | ICD-10-CM | POA: Insufficient documentation

## 2014-10-23 DIAGNOSIS — R200 Anesthesia of skin: Secondary | ICD-10-CM | POA: Insufficient documentation

## 2014-10-23 DIAGNOSIS — M2041 Other hammer toe(s) (acquired), right foot: Secondary | ICD-10-CM | POA: Insufficient documentation

## 2014-10-23 HISTORY — DX: Pain in left toe(s): M79.675

## 2014-10-23 HISTORY — DX: Pain in right toe(s): M79.674

## 2014-10-23 HISTORY — DX: Other hammer toe(s) (acquired), right foot: M20.41

## 2014-10-23 HISTORY — DX: Anesthesia of skin: R20.0

## 2014-10-23 HISTORY — DX: Localized swelling, mass and lump, unspecified: R22.9

## 2015-03-05 DIAGNOSIS — Z9181 History of falling: Secondary | ICD-10-CM

## 2015-03-05 HISTORY — DX: History of falling: Z91.81

## 2015-06-30 DIAGNOSIS — H0011 Chalazion right upper eyelid: Secondary | ICD-10-CM

## 2015-06-30 DIAGNOSIS — H269 Unspecified cataract: Secondary | ICD-10-CM

## 2015-06-30 HISTORY — DX: Chalazion right upper eyelid: H00.11

## 2015-06-30 HISTORY — DX: Unspecified cataract: H26.9

## 2015-07-18 DIAGNOSIS — N761 Subacute and chronic vaginitis: Secondary | ICD-10-CM

## 2015-07-18 HISTORY — DX: Subacute and chronic vaginitis: N76.1

## 2018-11-27 DIAGNOSIS — M67911 Unspecified disorder of synovium and tendon, right shoulder: Secondary | ICD-10-CM

## 2018-11-27 DIAGNOSIS — M7541 Impingement syndrome of right shoulder: Secondary | ICD-10-CM

## 2018-11-27 DIAGNOSIS — M19011 Primary osteoarthritis, right shoulder: Secondary | ICD-10-CM | POA: Insufficient documentation

## 2018-11-27 DIAGNOSIS — M25811 Other specified joint disorders, right shoulder: Secondary | ICD-10-CM

## 2018-11-27 HISTORY — DX: Other specified joint disorders, right shoulder: M25.811

## 2018-11-27 HISTORY — DX: Primary osteoarthritis, right shoulder: M19.011

## 2018-11-27 HISTORY — DX: Unspecified disorder of synovium and tendon, right shoulder: M67.911

## 2018-11-27 HISTORY — DX: Impingement syndrome of right shoulder: M75.41

## 2019-08-14 DIAGNOSIS — I10 Essential (primary) hypertension: Secondary | ICD-10-CM

## 2019-08-14 HISTORY — DX: Essential (primary) hypertension: I10

## 2019-10-28 ENCOUNTER — Other Ambulatory Visit: Payer: Self-pay

## 2019-10-28 ENCOUNTER — Emergency Department (HOSPITAL_BASED_OUTPATIENT_CLINIC_OR_DEPARTMENT_OTHER): Payer: Medicare Other

## 2019-10-28 ENCOUNTER — Encounter (HOSPITAL_BASED_OUTPATIENT_CLINIC_OR_DEPARTMENT_OTHER): Payer: Self-pay | Admitting: Emergency Medicine

## 2019-10-28 ENCOUNTER — Emergency Department (HOSPITAL_BASED_OUTPATIENT_CLINIC_OR_DEPARTMENT_OTHER)
Admission: EM | Admit: 2019-10-28 | Discharge: 2019-10-28 | Disposition: A | Discharge status: Home / Self Care | Payer: Medicare Other | Attending: Emergency Medicine | Admitting: Emergency Medicine

## 2019-10-28 DIAGNOSIS — Z7984 Long term (current) use of oral hypoglycemic drugs: Secondary | ICD-10-CM | POA: Insufficient documentation

## 2019-10-28 DIAGNOSIS — F17210 Nicotine dependence, cigarettes, uncomplicated: Secondary | ICD-10-CM | POA: Insufficient documentation

## 2019-10-28 DIAGNOSIS — I10 Essential (primary) hypertension: Secondary | ICD-10-CM | POA: Diagnosis not present

## 2019-10-28 DIAGNOSIS — E1169 Type 2 diabetes mellitus with other specified complication: Secondary | ICD-10-CM | POA: Diagnosis not present

## 2019-10-28 DIAGNOSIS — E785 Hyperlipidemia, unspecified: Secondary | ICD-10-CM | POA: Insufficient documentation

## 2019-10-28 DIAGNOSIS — R0789 Other chest pain: Secondary | ICD-10-CM | POA: Diagnosis present

## 2019-10-28 DIAGNOSIS — R079 Chest pain, unspecified: Secondary | ICD-10-CM

## 2019-10-28 DIAGNOSIS — Z79899 Other long term (current) drug therapy: Secondary | ICD-10-CM | POA: Diagnosis not present

## 2019-10-28 HISTORY — DX: Essential (primary) hypertension: I10

## 2019-10-28 LAB — CBC
HCT: 42 % (ref 36.0–46.0)
Hemoglobin: 14.2 g/dL (ref 12.0–15.0)
MCH: 30.7 pg (ref 26.0–34.0)
MCHC: 33.8 g/dL (ref 30.0–36.0)
MCV: 90.7 fL (ref 80.0–100.0)
Platelets: 255 10*3/uL (ref 150–400)
RBC: 4.63 MIL/uL (ref 3.87–5.11)
RDW: 14.8 % (ref 11.5–15.5)
WBC: 5.8 10*3/uL (ref 4.0–10.5)
nRBC: 0 % (ref 0.0–0.2)

## 2019-10-28 LAB — BASIC METABOLIC PANEL
Anion gap: 10 (ref 5–15)
BUN: 13 mg/dL (ref 6–20)
CO2: 24 mmol/L (ref 22–32)
Calcium: 8.8 mg/dL — ABNORMAL LOW (ref 8.9–10.3)
Chloride: 106 mmol/L (ref 98–111)
Creatinine, Ser: 0.98 mg/dL (ref 0.44–1.00)
GFR, Estimated: >60 mL/min (ref >60)
Glucose, Bld: 172 mg/dL — ABNORMAL HIGH (ref 70–99)
Potassium: 3.9 mmol/L (ref 3.5–5.1)
Sodium: 140 mmol/L (ref 135–145)

## 2019-10-28 LAB — TROPONIN I (HIGH SENSITIVITY): Troponin I (High Sensitivity): 4 ng/L (ref <18)

## 2019-10-28 NOTE — ED Triage Notes (Signed)
Chest pain and headache x 1 month.

## 2019-10-28 NOTE — ED Provider Notes (Addendum)
[facility] EMERGENCY DEPARTMENT Provider Note   CSN: [id number] Arrival date & time: 10/28/19  1438     History Chief Complaint  Patient presents with  . Chest Pain  . Headache    Sabrina Swanson is a 59 y.o. female with PMH of HTN, HLD, and type II DM on Metformin who presents the ED with 1 month history of chest pain and headache symptoms.  She is followed by [facility] primary care at [facility].  On my examination, patient states that she has been having intermittent central "pressure" chest pain over the course of the past few weeks.  No obvious aggravating or alleviating factors.  No associated symptoms of nausea, shortness of breath, palpitations, diaphoresis, or radiation of pain.  She states the last approximately 10 minutes duration before resolving spontaneously.  No exertional component.  She denies any personal history of ACS.  In addition to her HTN, HLD, and diabetes, she also endorses a 40-pack-year smoking history and uses albuterol intermittently for bronchitis.  Additionally, patient states that she has been having on and off headaches.  She reports a history of migraine headaches, but states that they had gone away for several years before recently returning.  She states that they are worse towards the end of the day.  She again denies any associated symptoms.  No blurred vision, dizziness, numbness or weakness, photophobia, nausea, or other associated symptoms.  She states that she is already on chronic pain medication for her back and knee pain for which she is followed at a chronic pain center.  She denies any fevers or chills, night sweats, recent illness or infection, cough, congestion, body aches, melena, history of PUD, history of clots or clotting disorder, recent unilateral extremity swelling or edema, or other symptoms.  HPI  Past Medical History:  Diagnosis Date  . Back pain   . Diabetes mellitus without complication ([place])   . High cholesterol   .  Hypertension     There are no problems to display for this patient.   Past Surgical History:  Procedure Laterality Date  . ABDOMINAL HYSTERECTOMY    . CESAREAN SECTION       OB History   No obstetric history on file.     No family history on file.  Social History   Tobacco Use  . Smoking status: Current Every Day Smoker    Packs/day: 1.00    Types: Cigarettes  . Smokeless tobacco: Never Used  Substance Use Topics  . Alcohol use: Yes    Comment: occasional  . Drug use: No    Comment: did eat marijuana brownie last week    Home Medications Prior to Admission medications   Medication Sig Start Date End Date Taking? Authorizing Provider  diazepam (VALIUM) 5 MG tablet Take 1 tablet (5 mg total) by mouth every 8 (eight) hours as needed (dizziness). 05/11/13   Jola Schmidt, MD  ondansetron (ZOFRAN ODT) 8 MG disintegrating tablet Take 1 tablet (8 mg total) by mouth every 8 (eight) hours as needed for nausea or vomiting. 05/11/13   Jola Schmidt, MD  OxyCODONE (OXYCONTIN) 10 mg T12A 12 hr tablet Take 10 mg by mouth every 12 (twelve) hours.    [provider]  pravastatin (PRAVACHOL) 80 MG tablet Take 80 mg by mouth daily.    [provider]    Allergies    Patient has no known allergies.  Review of Systems   Review of Systems  All other systems reviewed and are  negative.   Physical Exam Updated Vital Signs BP 129/80 (BP Location: Right Arm)   Pulse 72   Temp 98.8 F (37.1 C) (Oral)   Resp 18   Ht 5\' 2"  (1.575 m)   Wt 77.1 kg   SpO2 97%   BMI 31.09 kg/m   Physical Exam Vitals and nursing note reviewed. Exam conducted with a chaperone present.  Constitutional:      General: She is not in acute distress.    Appearance: Normal appearance. She is not ill-appearing.  HENT:     Head: Normocephalic and atraumatic.  Eyes:     General: No scleral icterus.    Extraocular Movements: Extraocular movements intact.     Conjunctiva/sclera: Conjunctivae  normal.     Pupils: Pupils are equal, round, and reactive to light.  Cardiovascular:     Rate and Rhythm: Normal rate and regular rhythm.     Pulses: Normal pulses.     Heart sounds: Normal heart sounds.  Pulmonary:     Effort: Pulmonary effort is normal. No respiratory distress.     Breath sounds: Normal breath sounds. No wheezing or rales.     Comments: CTA bilaterally.  Reproducible left-sided sternal TTP.  Not reproducible with other upper extremity movement. Abdominal:     General: Abdomen is flat. There is no distension.     Palpations: Abdomen is soft.     Tenderness: There is no abdominal tenderness. There is no guarding.  Musculoskeletal:     Cervical back: Normal range of motion.  Skin:    General: Skin is dry.     Capillary Refill: Capillary refill takes less than 2 seconds.  Neurological:     General: No focal deficit present.     Mental Status: She is alert and oriented to person, place, and time.     GCS: GCS eye subscore is 4. GCS verbal subscore is 5. GCS motor subscore is 6.     Cranial Nerves: No cranial nerve deficit.     Sensory: No sensory deficit.     Motor: No weakness.     Coordination: Coordination normal.     Gait: Gait normal.     Comments: No focal deficits.  CN II through XII gross intact.  Negative cerebellar and Romberg exam.  PERRL and EOM intact.  Psychiatric:        Mood and Affect: Mood normal.        Behavior: Behavior normal.        Thought Content: Thought content normal.     ED Results / Procedures / Treatments   Labs (all labs ordered are listed, but only abnormal results are displayed) Labs Reviewed  BASIC METABOLIC PANEL - Abnormal; Notable for the following components:      Result Value   Glucose, Bld 172 (*)    Calcium 8.8 (*)    All other components within normal limits  CBC  TROPONIN I (HIGH SENSITIVITY)    EKG EKG Interpretation  Date/Time:  Sunday October 28 2019 14:44:09 EDT Ventricular Rate:  93 PR  Interval:  162 QRS Duration: 84 QT Interval:  342 QTC Calculation: 425 R Axis:   76 Text Interpretation: Normal sinus rhythm ST & T wave abnormality, consider inferolateral ischemia Abnormal ECG ischemic changes worse since prior 11/14 Confirmed by Aletta Edouard [phone]) on 10/28/2019 4:20:56 PM   Radiology DG Chest 2 View  Result Date: 10/28/2019 CLINICAL DATA:  Chest pain. EXAM: CHEST - 2 VIEW COMPARISON:  February 02, 2018.  FINDINGS: The heart size and mediastinal contours are within normal limits. Both lungs are clear. No pneumothorax or pleural effusion is noted. The visualized skeletal structures are unremarkable. IMPRESSION: No active cardiopulmonary disease. Electronically Signed   By: Marijo Conception M.D.   On: 10/28/2019 15:24    Procedures Procedures (including critical care time)  Medications Ordered in ED Medications - No data to display  ED Course  I have reviewed the triage vital signs and the nursing notes.  Pertinent labs & imaging results that were available during my care of the patient were reviewed by me and considered in my medical decision making (see chart for details).    MDM Rules/Calculators/A&P HEAR Score: 4                        Patient is presented to the ED with headache symptoms and chest pain.  She states that God will help her with her headaches and she is not particularly concerned about them.  She is not requesting analgesics here in the ED or further medications.  She states that she already takes 5 medications at home and does not want to take anymore.  She simply wanted to be assessed.  She is neurologically intact and her physical exam is entirely benign.  I encouraged her to follow-up with primary care provider regarding her headache symptoms and she may ultimately benefit from referral to neurology for further evaluation.  Do not feel as though imaging of the head is warranted at this time.    As for her intermittent chest pain, her work-up  today is reassuring.  Troponin within normal limits at 4, do not need to trend given chronicity of her symptoms.  There was no significant episode of chest pain [place] to come to the ED for evaluation instead her presentation is based on the accumulation of intermittent episodes.  She states that they do not feel similar to her history of GERD and there are no obvious aggravating factors.  No associated symptoms.  CBC without anemia or leukocytosis.  BMP largely unremarkable.  Chest x-ray is personally reviewed and demonstrates no active cardiopulmonary disease.  However, she does have significant cardiac risk factors.  She is a 40-pack-year smoker who is diabetic, hypertensive, and on a statin medication for hyperlipidemia.  Her EKG does show worsening ischemic changes, particularly in the inferior leads.  These are compared to most recent tracing which was 6 years ago.  However, given her significant cardiac risk factors, feel as though she would benefit from urgent cardiology evaluation.  Will place an ambulatory referral to cardiology in [place].  She assures me that she will follow up with them as soon as possible for ongoing evaluation and management.   Final Clinical Impression(s) / ED Diagnoses Final diagnoses:  Nonspecific chest pain    Rx / DC Orders ED Discharge Orders         Ordered    Ambulatory referral to Cardiology        10/28/19 1703           Corena Herter, PA-C 10/28/19 1705    Corena Herter, PA-C 10/28/19 1711    Fredia Sorrow, MD 11/02/19 2200

## 2019-10-28 NOTE — Discharge Instructions (Addendum)
Your exam and laboratory work-up today was reassuring.  Please follow-up with your primary care provider regarding today's encounter.  I would like for them to help you manage your headache symptoms.  You may ultimately benefit from referral to neurology for ongoing evaluation and management.  I have placed an ambulatory referral to cardiology.  That means that they will call you soon to schedule an appointment for evaluation.  You have multiple risk factors and I believe that you would strongly benefit from a more in depth cardiac work-up, particularly given your recent symptoms of intermittent chest discomfort.  Return to the ED or seek immediate medical attention should you experience any new or worsening symptoms.

## 2019-11-12 ENCOUNTER — Other Ambulatory Visit: Payer: Self-pay

## 2019-11-12 DIAGNOSIS — E119 Type 2 diabetes mellitus without complications: Secondary | ICD-10-CM | POA: Insufficient documentation

## 2019-11-12 DIAGNOSIS — M549 Dorsalgia, unspecified: Secondary | ICD-10-CM | POA: Insufficient documentation

## 2019-11-13 ENCOUNTER — Encounter: Payer: Self-pay | Admitting: Cardiology

## 2019-11-13 ENCOUNTER — Other Ambulatory Visit: Payer: Self-pay

## 2019-11-13 ENCOUNTER — Ambulatory Visit (INDEPENDENT_AMBULATORY_CARE_PROVIDER_SITE_OTHER): Payer: Medicare Other | Admitting: Cardiology

## 2019-11-13 VITALS — BP 134/88 | HR 80 | Ht 63.0 in | Wt 171.1 lb

## 2019-11-13 DIAGNOSIS — F17210 Nicotine dependence, cigarettes, uncomplicated: Secondary | ICD-10-CM | POA: Insufficient documentation

## 2019-11-13 DIAGNOSIS — I208 Other forms of angina pectoris: Secondary | ICD-10-CM

## 2019-11-13 DIAGNOSIS — E119 Type 2 diabetes mellitus without complications: Secondary | ICD-10-CM | POA: Diagnosis not present

## 2019-11-13 DIAGNOSIS — E7800 Pure hypercholesterolemia, unspecified: Secondary | ICD-10-CM | POA: Diagnosis not present

## 2019-11-13 DIAGNOSIS — I10 Essential (primary) hypertension: Secondary | ICD-10-CM | POA: Diagnosis not present

## 2019-11-13 DIAGNOSIS — R011 Cardiac murmur, unspecified: Secondary | ICD-10-CM

## 2019-11-13 DIAGNOSIS — I209 Angina pectoris, unspecified: Secondary | ICD-10-CM

## 2019-11-13 HISTORY — DX: Nicotine dependence, cigarettes, uncomplicated: F17.210

## 2019-11-13 HISTORY — DX: Cardiac murmur, unspecified: R01.1

## 2019-11-13 HISTORY — DX: Angina pectoris, unspecified: I20.9

## 2019-11-13 MED ORDER — NITROGLYCERIN 0.4 MG SL SUBL: 0.4000 mg | SUBLINGUAL_TABLET | SUBLINGUAL | 6 refills | Status: DC | PRN

## 2019-11-13 NOTE — Patient Instructions (Signed)
Medication Instructions:  Your physician has recommended you make the following change in your medication:   Take Nitroglycerin as needed for chest pain.  *If you need a refill on your cardiac medications before your next appointment, please call your pharmacy*   Lab Work: None ordered If you have labs (blood work) drawn today and your tests are completely normal, you will receive your results only by: Marland Kitchen MyChart Message (if you have MyChart) OR . A paper copy in the mail If you have any lab test that is abnormal or we need to change your treatment, we will call you to review the results.   Testing/Procedures: Your physician has requested that you have an echocardiogram. Echocardiography is a painless test that uses sound waves to create images of your heart. It provides your doctor with information about the size and shape of your heart and how well your heart's chambers and valves are working. This procedure takes approximately one hour. There are no restrictions for this procedure.  Your physician has requested that you have a lexiscan myoview. For further information please visit [email or web address]. Please follow instruction sheet, as given.  The test will take approximately 3 to 4 hours to complete; you may bring reading material.  If someone comes with you to your appointment, they will need to remain in the main lobby due to limited space in the testing area. **If you are pregnant or breastfeeding, please notify the nuclear lab prior to your appointment**  How to prepare for your Myocardial Perfusion Test: . Do not eat or drink 3 hours prior to your test, except you may have water. . Do not consume products containing caffeine (regular or decaffeinated) 12 hours prior to your test. (ex: coffee, chocolate, sodas, tea). . Do bring a list of your current medications with you.  If not listed below, you may take your medications as normal. . Do wear comfortable clothes (no dresses or  overalls) and walking shoes, tennis shoes preferred (No heels or open toe shoes are allowed). . Do NOT wear cologne, perfume, aftershave, or lotions (deodorant is allowed). . If these instructions are not followed, your test will have to be rescheduled.    Follow-Up: At [facility], you and your health needs are our priority.  As part of our continuing mission to provide you with exceptional heart care, we have created designated Provider Care Teams.  These Care Teams include your primary Cardiologist (physician) and Advanced Practice Providers (APPs -  Physician Assistants and Nurse Practitioners) who all work together to provide you with the care you need, when you need it.  We recommend signing up for the patient portal called "MyChart".  Sign up information is provided on this After Visit Summary.  MyChart is used to connect with patients for Virtual Visits (Telemedicine).  Patients are able to view lab/test results, encounter notes, upcoming appointments, etc.  Non-urgent messages can be sent to your provider as well.   To learn more about what you can do with MyChart, go to [email or web address].    Your next appointment:   3 month(s)  The format for your next appointment:   In Person  Provider:   Jyl Heinz, MD   Other Instructions  Cardiac Nuclear Scan  A cardiac nuclear scan is a test that is done to check the flow of blood to your heart. It is done when you are resting and when you are exercising. The test looks for problems such as:  Not enough blood reaching a  portion of the heart.  The heart muscle not working as it should. You may need this test if:  You have heart disease.  You have had lab results that are not normal.  You have had heart surgery or a balloon procedure to open up blocked arteries (angioplasty).  You have chest pain.  You have shortness of breath. In this test, a special dye (tracer) is put into your bloodstream. The tracer will travel  to your heart. A camera will then take pictures of your heart to see how the tracer moves through your heart. This test is usually done at a hospital and takes 2-4 hours. Tell a doctor about:  Any allergies you have.  All medicines you are taking, including vitamins, herbs, eye drops, creams, and over-the-counter medicines.  Any problems you or family members have had with anesthetic medicines.  Any blood disorders you have.  Any surgeries you have had.  Any medical conditions you have.  Whether you are pregnant or may be pregnant. What are the risks? Generally, this is a safe test. However, problems may occur, such as:  Serious chest pain and heart attack. This is only a risk if the stress portion of the test is done.  Rapid heartbeat.  A feeling of warmth in your chest. This feeling usually does not last long.  Allergic reaction to the tracer. What happens before the test?  Ask your doctor about changing or stopping your normal medicines. This is important.  Follow instructions from your doctor about what you cannot eat or drink.  Remove your jewelry on the day of the test. What happens during the test? 1. An IV tube will be inserted into one of your veins. 2. Your doctor will give you a small amount of tracer through the IV tube. 3. You will wait for 20-40 minutes while the tracer moves through your bloodstream. 4. Your heart will be monitored with an electrocardiogram (ECG). 5. You will lie down on an exam table. 6. Pictures of your heart will be taken for about 15-20 minutes. 7. You may also have a stress test. For this test, one of these things may be done: ? You will be asked to exercise on a treadmill or a stationary bike. ? You will be given medicines that will make your heart work harder. This is done if you are unable to exercise. 8. When blood flow to your heart has peaked, a tracer will again be given through the IV tube. 9. After 20-40 minutes, you will get  back on the exam table. More pictures will be taken of your heart. 10. Depending on the tracer that is used, more pictures may need to be taken 3-4 hours later. 11. Your IV tube will be removed when the test is over. The test may vary among doctors and hospitals. What happens after the test? 1. Ask your doctor: ? Whether you can return to your normal schedule, including diet, activities, and medicines. ? Whether you should drink more fluids. This will help to remove the tracer from your body. Drink enough fluid to keep your pee (urine) pale yellow. 2. Ask your doctor, or the department that is doing the test: ? When will my results be ready? ? How will I get my results? Summary  A cardiac nuclear scan is a test that is done to check the flow of blood to your heart.  Tell your doctor whether you are pregnant or may be pregnant.  Before the test, ask  your doctor about changing or stopping your normal medicines. This is important.  Ask your doctor whether you can return to your normal activities. You may be asked to drink more fluids. This information is not intended to replace advice given to you by your health care provider. Make sure you discuss any questions you have with your health care provider. Document Revised: 04/12/2018 Document Reviewed: 06/06/2017 Elsevier Patient Education  [place].  Echocardiogram An echocardiogram is a procedure that uses painless sound waves (ultrasound) to produce an image of the heart. Images from an echocardiogram can provide important information about:  Signs of coronary artery disease (CAD).  Aneurysm detection. An aneurysm is a weak or damaged part of an artery wall that bulges out from the normal force of blood pumping through the body.  Heart size and shape. Changes in the size or shape of the heart can be associated with certain conditions, including heart failure, aneurysm, and CAD.  Heart muscle function.  Heart valve  function.  Signs of a past heart attack.  Fluid buildup around the heart.  Thickening of the heart muscle.  A tumor or infectious growth around the heart valves. Tell a health care provider about:  Any allergies you have.  All medicines you are taking, including vitamins, herbs, eye drops, creams, and over-the-counter medicines.  Any blood disorders you have.  Any surgeries you have had.  Any medical conditions you have.  Whether you are pregnant or may be pregnant. What are the risks? Generally, this is a safe procedure. However, problems may occur, including:  Allergic reaction to dye (contrast) that may be used during the procedure. What happens before the procedure? No specific preparation is needed. You may eat and drink normally. What happens during the procedure?    An IV tube may be inserted into one of your veins.  You may receive contrast through this tube. A contrast is an injection that improves the quality of the pictures from your heart.  A gel will be applied to your chest.  A wand-like tool (transducer) will be moved over your chest. The gel will help to transmit the sound waves from the transducer.  The sound waves will harmlessly bounce off of your heart to allow the heart images to be captured in real-time motion. The images will be recorded on a computer. The procedure may vary among health care providers and hospitals. What happens after the procedure?  You may return to your normal, everyday life, including diet, activities, and medicines, unless your health care provider tells you not to do that. Summary  An echocardiogram is a procedure that uses painless sound waves (ultrasound) to produce an image of the heart.  Images from an echocardiogram can provide important information about the size and shape of your heart, heart muscle function, heart valve function, and fluid buildup around your heart.  You do not need to do anything to prepare  before this procedure. You may eat and drink normally.  After the echocardiogram is completed, you may return to your normal, everyday life, unless your health care provider tells you not to do that. This information is not intended to replace advice given to you by your health care provider. Make sure you discuss any questions you have with your health care provider. Document Revised: 04/13/2018 Document Reviewed: 01/24/2016 Elsevier Patient Education  [place].  Nitroglycerin sublingual tablets What is this medicine? NITROGLYCERIN (nye troe GLI ser in) is a type of vasodilator. It relaxes  blood vessels, increasing the blood and oxygen supply to your heart. This medicine is used to relieve chest pain caused by angina. It is also used to prevent chest pain before activities like climbing stairs, going outdoors in cold weather, or sexual activity. This medicine may be used for other purposes; ask your health care provider or pharmacist if you have questions. COMMON BRAND NAME(S): Nitroquick, Nitrostat, Nitrotab What should I tell my health care provider before I take this medicine? They need to know if you have any of these conditions:  anemia  head injury, recent stroke, or bleeding in the brain  liver disease  previous heart attack  an unusual or allergic reaction to nitroglycerin, other medicines, foods, dyes, or preservatives  pregnant or trying to get pregnant  breast-feeding How should I use this medicine? Take this medicine by mouth as needed. At the first sign of an angina attack (chest pain or tightness) place one tablet under your tongue. You can also take this medicine 5 to 10 minutes before an event likely to produce chest pain. Follow the directions on the prescription label. Let the tablet dissolve under the tongue. Do not swallow whole. Replace the dose if you accidentally swallow it. It will help if your mouth is not dry. Saliva around the tablet will help it to  dissolve more quickly. Do not eat or drink, smoke or chew tobacco while a tablet is dissolving. If you are not better within 5 minutes after taking ONE dose of nitroglycerin, call 9-1-1 immediately to seek emergency medical care. Do not take more than 3 nitroglycerin tablets over 15 minutes. If you take this medicine often to relieve symptoms of angina, your doctor or health care professional may provide you with different instructions to manage your symptoms. If symptoms do not go away after following these instructions, it is important to call 9-1-1 immediately. Do not take more than 3 nitroglycerin tablets over 15 minutes. Talk to your pediatrician regarding the use of this medicine in children. Special care may be needed. Overdosage: If you think you have taken too much of this medicine contact a poison control center or emergency room at once. NOTE: This medicine is only for you. Do not share this medicine with others. What if I miss a dose? This does not apply. This medicine is only used as needed. What may interact with this medicine? Do not take this medicine with any of the following medications:  certain migraine medicines like ergotamine and dihydroergotamine (DHE)  medicines used to treat erectile dysfunction like sildenafil, tadalafil, and vardenafil  riociguat This medicine may also interact with the following medications:  alteplase  aspirin  heparin  medicines for high blood pressure  medicines for mental depression  other medicines used to treat angina  phenothiazines like chlorpromazine, mesoridazine, prochlorperazine, thioridazine This list may not describe all possible interactions. Give your health care provider a list of all the medicines, herbs, non-prescription drugs, or dietary supplements you use. Also tell them if you smoke, drink alcohol, or use illegal drugs. Some items may interact with your medicine. What should I watch for while using this  medicine? Tell your doctor or health care professional if you feel your medicine is no longer working. Keep this medicine with you at all times. Sit or lie down when you take your medicine to prevent falling if you feel dizzy or faint after using it. Try to remain calm. This will help you to feel better faster. If you feel dizzy, take  several deep breaths and lie down with your feet propped up, or bend forward with your head resting between your knees. You may get drowsy or dizzy. Do not drive, use machinery, or do anything that needs mental alertness until you know how this drug affects you. Do not stand or sit up quickly, especially if you are an older patient. This reduces the risk of dizzy or fainting spells. Alcohol can make you more drowsy and dizzy. Avoid alcoholic drinks. Do not treat yourself for coughs, colds, or pain while you are taking this medicine without asking your doctor or health care professional for advice. Some ingredients may increase your blood pressure. What side effects may I notice from receiving this medicine? Side effects that you should report to your doctor or health care professional as soon as possible:  blurred vision  dry mouth  skin rash  sweating  the feeling of extreme pressure in the head  unusually weak or tired Side effects that usually do not require medical attention (report to your doctor or health care professional if they continue or are bothersome):  flushing of the face or neck  headache  irregular heartbeat, palpitations  nausea, vomiting This list may not describe all possible side effects. Call your doctor for medical advice about side effects. You may report side effects to FDA at 1-800-FDA-1088. Where should I keep my medicine? Keep out of the reach of children. Store at room temperature between 20 and 25 degrees C (68 and 77 degrees F). Store in [facility] [employer]. Protect from light and moisture. Keep tightly closed. Throw away any  unused medicine after the expiration date. NOTE: This sheet is a summary. It may not cover all possible information. If you have questions about this medicine, talk to your doctor, pharmacist, or health care provider.  2020 Elsevier/Gold Standard (2012-10-19 17:57:36)

## 2019-11-13 NOTE — Progress Notes (Signed)
Cardiology Office Note:    Date:  11/13/2019   ID:  Sabrina Swanson, DOB June 16, 1960, MRN [id number]  PCP:  Fortino Sic, PA  Cardiologist:  Jenean Lindau, MD   Referring MD: Corena Herter, PA-C    ASSESSMENT:    1. Essential hypertension   2. Hypercholesterolemia   3. Diabetes mellitus without complication ([place])   4. Angina pectoris (HCC)   5. Cardiac murmur   6. Continuous dependence on cigarette smoking    PLAN:    In order of problems listed above:  1. Angina pectoris: Patient symptoms are concerning.  Following recommendations were made to her.  She was advised to take a coated baby aspirin on a daily basis and she is doing better at this time.  Sublingual nitroglycerin prescription was sent, its protocol and 911 protocol explained and the patient vocalized understanding questions were answered to the patient's satisfaction in view of the symptoms I will do a Lexiscan sestamibi to understand this further and get an objective evaluation. 2. Essential hypertension: Blood pressure stable and diet was emphasized 3. Cardiac murmur echocardiogram will be done to assess murmur heard on auscultation.  Mixed dyslipidemia: Diet was emphasized and patient is on statin therapy. 4. Diabetes mellitus: Diet was emphasized.  Patient is doing well with her diet.  These are followed by primary care physician. 5. Cigarette smoking: I spent 5 minutes with the patient discussing solely about smoking. Smoking cessation was counseled. I suggested to the patient also different medications and pharmacological interventions. Patient is keen to try stopping on its own at this time. He will get back to me if he needs any further assistance in this matter. 6. Patient will be seen in follow-up appointment in 6 months or earlier if the patient has any concerns  Medication Adjustments/Labs and Tests Ordered: Current medicines are reviewed at length with the patient today.  Concerns regarding  medicines are outlined above.  No orders of the defined types were placed in this encounter.  No orders of the defined types were placed in this encounter.    History of Present Illness:    Sabrina Swanson is a 59 y.o. female who is being seen today for the evaluation of chest pain at the request of Corena Herter, PA-C.  Patient is a pleasant 59 year old female.  She has past medical history of essential hypertension dyslipidemia and diabetes mellitus and unfortunately is a long-term heavy smoker.  She mentions to me she had an episode of chest tightness for which she went to the emergency room.  No radiation to the neck or to the arms.  She does not exercise on a regular basis but she is running around taking her care of her children with this she has no symptoms.  She is sexually active without any symptoms of chest pain or chest tightness.  At the time of my evaluation, the patient is alert awake oriented and in no distress.  Past Medical History:  Diagnosis Date  . Acquired trigger finger 06/10/2012  . Alopecia 05/16/2013  . Anxiety state 06/10/2012  . Arthritis of right acromioclavicular joint 11/27/2018  . Back pain   . Benign neoplasm of colon 06/10/2012  . Bursitis disorder 05/11/2011  . Carpal tunnel syndrome 06/10/2012   Formatting of this note might be different from the original. bilateral  . Chalazion right upper eyelid 06/30/2015  . COPD (chronic obstructive pulmonary disease) ([place]) 05/16/2013  . DDD (degenerative disc disease), lumbosacral 06/10/2012  . Depressive disorder  06/10/2012  . Diabetes mellitus without complication ([place])   . Dyspareunia 03/04/2014  . Dysthymia 06/10/2012  . Encounter for long-term (current) use of other medications 06/10/2012  . Essential hypertension 08/14/2019  . Generalized anxiety disorder 06/10/2012  . Hammer toe of right foot 10/23/2014   Formatting of this note might be different from the original. Second and third tarsals Formatting of this note might be  different from the original. Formatting of this note might be different from the original. Second and third tarsals  . High cholesterol   . Hypercholesterolemia 03/06/2013  . Hypertension   . Insomnia 06/10/2012  . Lumbosacral spondylosis 06/10/2012  . Lump of skin 10/23/2014   Formatting of this note might be different from the original. Right breast-manipulated by patient prior to clinical exam  . Migraine syndrome 06/10/2012  . Numbness of toes 10/23/2014  . Osteoarthrosis, generalized, involving multiple sites 06/10/2012  . Pain in soft tissues of limb 06/10/2012   Formatting of this note might be different from the original. 03/09/11 right arm.  . Pain in toes of both feet 10/23/2014  . Patellar tendinitis 06/10/2012  . Persistent lymphocytosis 05/25/2014  . Plantar fascial fibromatosis 06/10/2012  . Risk for falls 03/05/2015  . Sensorineural hearing loss 06/10/2012  . Shoulder impingement, right 11/27/2018  . Subacute vaginitis 07/18/2015  . Tendinopathy of rotator cuff, right 11/27/2018  . Tinnitus 03/21/2013  . Tobacco use disorder 03/06/2013  . Type 2 diabetes, controlled, with neuropathy ([place]) 10/21/2014  . Unspecified cataract 06/30/2015  . Vertigo 06/14/2013  . Vestibular dizziness 06/10/2012    Past Surgical History:  Procedure Laterality Date  . ABDOMINAL HYSTERECTOMY    . CESAREAN SECTION      Current Medications: Current Meds  Medication Sig  . albuterol (VENTOLIN HFA) 108 (90 Base) MCG/ACT inhaler Inhale 2 puffs into the lungs every 4 (four) hours as needed.  Marland Kitchen aspirin 81 MG EC tablet Take 81 mg by mouth daily.  . diazepam (VALIUM) 5 MG tablet Take 1 tablet (5 mg total) by mouth every 8 (eight) hours as needed (dizziness).  . ergocalciferol (VITAMIN D2) 1.25 MG (50000 UT) capsule Take 50,000 Units by mouth every 7 (seven) days.  . Ipratropium-Albuterol (COMBIVENT) 20-100 MCG/ACT AERS respimat Inhale 1 puff into the lungs.  Marland Kitchen losartan (COZAAR) 25 MG tablet Take 25 mg by mouth daily.  .  metFORMIN (GLUCOPHAGE-XR) 500 MG 24 hr tablet Take 500 mg by mouth daily.  . ondansetron (ZOFRAN ODT) 8 MG disintegrating tablet Take 1 tablet (8 mg total) by mouth every 8 (eight) hours as needed for nausea or vomiting.  . OxyCODONE (OXYCONTIN) 10 mg T12A 12 hr tablet Take 10 mg by mouth every 12 (twelve) hours.     Allergies:   Acetaminophen   Social History   Socioeconomic History  . Marital status: Single    Spouse name: Not on file  . Number of children: Not on file  . Years of education: Not on file  . Highest education level: Not on file  Occupational History  . Not on file  Tobacco Use  . Smoking status: Current Every Day Smoker    Packs/day: 1.00    Types: Cigarettes  . Smokeless tobacco: Never Used  Substance and Sexual Activity  . Alcohol use: Yes    Comment: occasional  . Drug use: No    Comment: did eat marijuana brownie last week  . Sexual activity: Not on file  Other Topics Concern  . Not on file  Social History Narrative  . Not on file   Social Determinants of Health   Financial Resource Strain:   . Difficulty of Paying Living Expenses: Not on file  Food Insecurity:   . Worried About Charity fundraiser in the Last Year: Not on file  . Ran Out of Food in the Last Year: Not on file  Transportation Needs:   . Lack of Transportation (Medical): Not on file  . Lack of Transportation (Non-Medical): Not on file  Physical Activity:   . Days of Exercise per Week: Not on file  . Minutes of Exercise per Session: Not on file  Stress:   . Feeling of Stress : Not on file  Social Connections:   . Frequency of Communication with Friends and Family: Not on file  . Frequency of Social Gatherings with Friends and Family: Not on file  . Attends Religious Services: Not on file  . Active Member of Clubs or Organizations: Not on file  . Attends Archivist Meetings: Not on file  . Marital Status: Not on file     Family History: The patient's family history  includes Diabetes in her maternal grandfather; Hypertension in her maternal grandmother; Stomach cancer in her mother.  ROS:   Please see the history of present illness.    All other systems reviewed and are negative.  EKGs/Labs/Other Studies Reviewed:    The following studies were reviewed today: EKG reveals sinus rhythm and nonspecific ST-T changes   Recent Labs: 10/28/2019: BUN 13; Creatinine, Ser 0.98; Hemoglobin 14.2; Platelets 255; Potassium 3.9; Sodium 140  Recent Lipid Panel No results found for: CHOL, TRIG, HDL, CHOLHDL, VLDL, LDLCALC, LDLDIRECT  Physical Exam:    VS:  BP 134/88   Pulse 80   Ht 5\' 3"  (1.6 m)   Wt 171 lb 1.3 oz (77.6 kg)   BMI 30.31 kg/m     Wt Readings from Last 3 Encounters:  11/13/19 171 lb 1.3 oz (77.6 kg)  10/28/19 170 lb (77.1 kg)  05/10/13 160 lb (72.6 kg)     GEN: Patient is in no acute distress HEENT: Normal NECK: No JVD; No carotid bruits LYMPHATICS: No lymphadenopathy CARDIAC: S1 S2 regular, 2/6 systolic murmur at the apex. RESPIRATORY:  Clear to auscultation without rales, wheezing or rhonchi  ABDOMEN: Soft, non-tender, non-distended MUSCULOSKELETAL:  No edema; No deformity  SKIN: Warm and dry NEUROLOGIC:  Alert and oriented x 3 PSYCHIATRIC:  Normal affect    Signed, Jenean Lindau, MD  11/13/2019 9:36 AM    [place]

## 2019-12-03 ENCOUNTER — Telehealth (HOSPITAL_COMMUNITY): Payer: Self-pay

## 2019-12-03 NOTE — Telephone Encounter (Signed)
Spoke with the patient, she stated that she would be here for her test. Asked to call back with any questions. S.Ramsie Ostrander EMTP ?

## 2019-12-06 ENCOUNTER — Ambulatory Visit (HOSPITAL_COMMUNITY): Payer: Medicare Other

## 2019-12-06 ENCOUNTER — Other Ambulatory Visit: Payer: Self-pay

## 2019-12-06 ENCOUNTER — Ambulatory Visit (HOSPITAL_COMMUNITY): Payer: Medicare Other | Attending: Cardiovascular Disease

## 2019-12-06 ENCOUNTER — Encounter (HOSPITAL_COMMUNITY): Payer: Self-pay | Admitting: *Deleted

## 2019-12-06 DIAGNOSIS — R011 Cardiac murmur, unspecified: Secondary | ICD-10-CM | POA: Insufficient documentation

## 2019-12-06 LAB — ECHOCARDIOGRAM COMPLETE
Area-P 1/2: 2.91 cm2
Height: 63 in
S' Lateral: 2.8 cm
Weight: 2736 oz

## 2019-12-12 ENCOUNTER — Encounter (HOSPITAL_COMMUNITY): Payer: Self-pay

## 2019-12-12 ENCOUNTER — Encounter (HOSPITAL_COMMUNITY): Payer: Medicare Other

## 2019-12-12 ENCOUNTER — Telehealth (HOSPITAL_COMMUNITY): Payer: Self-pay | Admitting: Cardiology

## 2019-12-12 NOTE — Telephone Encounter (Signed)
Patient called and cancelled Myoview due to she is not feeling well and will call us back at a later date to reschedule. Order will be removed from the [facility] and if patient calls back to reschedule we will reinstate the order. Thank you and have a great day!Marland Kitchen

## 2019-12-15 ENCOUNTER — Encounter (HOSPITAL_BASED_OUTPATIENT_CLINIC_OR_DEPARTMENT_OTHER): Payer: Self-pay | Admitting: Emergency Medicine

## 2019-12-15 ENCOUNTER — Emergency Department (HOSPITAL_BASED_OUTPATIENT_CLINIC_OR_DEPARTMENT_OTHER)
Admission: EM | Admit: 2019-12-15 | Discharge: 2019-12-15 | Disposition: A | Discharge status: Home / Self Care | Payer: Medicare Other | Attending: Emergency Medicine | Admitting: Emergency Medicine

## 2019-12-15 ENCOUNTER — Other Ambulatory Visit: Payer: Self-pay

## 2019-12-15 DIAGNOSIS — R1084 Generalized abdominal pain: Secondary | ICD-10-CM

## 2019-12-15 DIAGNOSIS — E1140 Type 2 diabetes mellitus with diabetic neuropathy, unspecified: Secondary | ICD-10-CM | POA: Diagnosis not present

## 2019-12-15 DIAGNOSIS — K429 Umbilical hernia without obstruction or gangrene: Secondary | ICD-10-CM | POA: Diagnosis not present

## 2019-12-15 DIAGNOSIS — J449 Chronic obstructive pulmonary disease, unspecified: Secondary | ICD-10-CM | POA: Insufficient documentation

## 2019-12-15 DIAGNOSIS — Z85038 Personal history of other malignant neoplasm of large intestine: Secondary | ICD-10-CM | POA: Diagnosis not present

## 2019-12-15 DIAGNOSIS — K5900 Constipation, unspecified: Secondary | ICD-10-CM

## 2019-12-15 DIAGNOSIS — Z7982 Long term (current) use of aspirin: Secondary | ICD-10-CM | POA: Diagnosis not present

## 2019-12-15 DIAGNOSIS — Z79899 Other long term (current) drug therapy: Secondary | ICD-10-CM | POA: Diagnosis not present

## 2019-12-15 DIAGNOSIS — F17210 Nicotine dependence, cigarettes, uncomplicated: Secondary | ICD-10-CM | POA: Diagnosis not present

## 2019-12-15 DIAGNOSIS — I10 Essential (primary) hypertension: Secondary | ICD-10-CM | POA: Diagnosis not present

## 2019-12-15 DIAGNOSIS — R1110 Vomiting, unspecified: Secondary | ICD-10-CM | POA: Insufficient documentation

## 2019-12-15 DIAGNOSIS — Z7984 Long term (current) use of oral hypoglycemic drugs: Secondary | ICD-10-CM | POA: Insufficient documentation

## 2019-12-15 MED ORDER — POLYETHYLENE GLYCOL 3350 17 G PO PACK: 17.0000 g | PACK | Freq: Every day | ORAL | 0 refills | Status: AC

## 2019-12-15 NOTE — Discharge Instructions (Addendum)
Mix 1 packet of MiraLAX and 20 ounces of water and drink.  May repeat 12 to 24 hours later and add an extra packet as necessary and titrate to effect.  Please return to the ED as discussed if you develop any worsening symptoms including worsening abdominal pain, uncontrollable nausea and vomiting, not passing gas, fever.

## 2019-12-15 NOTE — ED Provider Notes (Signed)
[facility] EMERGENCY DEPARTMENT Provider Note   CSN: [id number] Arrival date & time: 12/15/19  0033     History Chief Complaint  Patient presents with  . Abdominal Pain    Sabrina Swanson is a 59 y.o. female.  The history is provided by the patient.  Abdominal Pain Pain location:  Generalized Pain quality: aching, bloating and cramping   Pain radiates to:  Does not radiate Pain severity:  Mild Onset quality:  Gradual Duration:  1 day Timing:  Intermittent Progression:  Waxing and waning Chronicity:  New Context: laxative use (constipated, hard pellet like stool today)   Relieved by:  Nothing Worsened by:  Nothing Ineffective treatments: laxative(milk of mag) with minimal affect. Associated symptoms: constipation and vomiting (once)   Associated symptoms: no chest pain, no chills, no cough, no diarrhea, no dysuria, no fever, no hematuria, no nausea, no shortness of breath and no sore throat   Risk factors: multiple surgeries        Past Medical History:  Diagnosis Date  . Acquired trigger finger 06/10/2012  . Alopecia 05/16/2013  . Anxiety state 06/10/2012  . Arthritis of right acromioclavicular joint 11/27/2018  . Back pain   . Benign neoplasm of colon 06/10/2012  . Bursitis disorder 05/11/2011  . Carpal tunnel syndrome 06/10/2012   Formatting of this note might be different from the original. bilateral  . Chalazion right upper eyelid 06/30/2015  . COPD (chronic obstructive pulmonary disease) ([place]) 05/16/2013  . DDD (degenerative disc disease), lumbosacral 06/10/2012  . Depressive disorder 06/10/2012  . Diabetes mellitus without complication ([place])   . Dyspareunia 03/04/2014  . Dysthymia 06/10/2012  . Encounter for long-term (current) use of other medications 06/10/2012  . Essential hypertension 08/14/2019  . Generalized anxiety disorder 06/10/2012  . Hammer toe of right foot 10/23/2014   Formatting of this note might be different from the original. Second and third  tarsals Formatting of this note might be different from the original. Formatting of this note might be different from the original. Second and third tarsals  . High cholesterol   . Hypercholesterolemia 03/06/2013  . Hypertension   . Insomnia 06/10/2012  . Lumbosacral spondylosis 06/10/2012  . Lump of skin 10/23/2014   Formatting of this note might be different from the original. Right breast-manipulated by patient prior to clinical exam  . Migraine syndrome 06/10/2012  . Numbness of toes 10/23/2014  . Osteoarthrosis, generalized, involving multiple sites 06/10/2012  . Pain in soft tissues of limb 06/10/2012   Formatting of this note might be different from the original. 03/09/11 right arm.  . Pain in toes of both feet 10/23/2014  . Patellar tendinitis 06/10/2012  . Persistent lymphocytosis 05/25/2014  . Plantar fascial fibromatosis 06/10/2012  . Risk for falls 03/05/2015  . Sensorineural hearing loss 06/10/2012  . Shoulder impingement, right 11/27/2018  . Subacute vaginitis 07/18/2015  . Tendinopathy of rotator cuff, right 11/27/2018  . Tinnitus 03/21/2013  . Tobacco use disorder 03/06/2013  . Type 2 diabetes, controlled, with neuropathy ([place]) 10/21/2014  . Unspecified cataract 06/30/2015  . Vertigo 06/14/2013  . Vestibular dizziness 06/10/2012    Patient Active Problem List   Diagnosis Date Noted  . Angina pectoris ([place]) 11/13/2019  . Cardiac murmur 11/13/2019  . Continuous dependence on cigarette smoking 11/13/2019  . Back pain   . Diabetes mellitus without complication ([place])   . Essential hypertension 08/14/2019  . Arthritis of right acromioclavicular joint 11/27/2018  . Shoulder impingement, right 11/27/2018  . Tendinopathy of  rotator cuff, right 11/27/2018  . Subacute vaginitis 07/18/2015  . Chalazion right upper eyelid 06/30/2015  . Unspecified cataract 06/30/2015  . Risk for falls 03/05/2015  . Hammer toe of right foot 10/23/2014  . Lump of skin 10/23/2014  . Numbness of toes 10/23/2014  .  Pain in toes of both feet 10/23/2014  . Type 2 diabetes, controlled, with neuropathy ([place]) 10/21/2014  . Persistent lymphocytosis 05/25/2014  . Dyspareunia 03/04/2014  . Vertigo 06/14/2013  . Alopecia 05/16/2013  . COPD (chronic obstructive pulmonary disease) ([place]) 05/16/2013  . Tinnitus 03/21/2013  . Hypercholesterolemia 03/06/2013  . Tobacco use disorder 03/06/2013  . Acquired trigger finger 06/10/2012  . Carpal tunnel syndrome 06/10/2012  . Anxiety state 06/10/2012  . Benign neoplasm of colon 06/10/2012  . Depressive disorder 06/10/2012  . Dysthymia 06/10/2012  . Encounter for long-term (current) use of other medications 06/10/2012  . Generalized anxiety disorder 06/10/2012  . Insomnia 06/10/2012  . DDD (degenerative disc disease), lumbosacral 06/10/2012  . Lumbosacral spondylosis 06/10/2012  . Migraine syndrome 06/10/2012  . Osteoarthrosis, generalized, involving multiple sites 06/10/2012  . Pain in soft tissues of limb 06/10/2012  . Patellar tendinitis 06/10/2012  . Plantar fascial fibromatosis 06/10/2012  . Sensorineural hearing loss 06/10/2012  . Vestibular dizziness 06/10/2012  . Bursitis disorder 05/11/2011    Past Surgical History:  Procedure Laterality Date  . ABDOMINAL HYSTERECTOMY    . CESAREAN SECTION       OB History   No obstetric history on file.     Family History  Problem Relation Age of Onset  . Stomach cancer Mother   . Hypertension Maternal Grandmother   . Diabetes Maternal Grandfather     Social History   Tobacco Use  . Smoking status: Current Every Day Smoker    Packs/day: 1.00    Types: Cigarettes  . Smokeless tobacco: Never Used  Vaping Use  . Vaping Use: Never used  Substance Use Topics  . Alcohol use: Yes    Comment: occasional  . Drug use: No    Comment: did eat marijuana brownie last week    Home Medications Prior to Admission medications   Medication Sig Start Date End Date Taking? Authorizing Provider  albuterol  (VENTOLIN HFA) 108 (90 Base) MCG/ACT inhaler Inhale 2 puffs into the lungs every 4 (four) hours as needed. 07/19/16   [provider]  aspirin 81 MG EC tablet Take 81 mg by mouth daily.    [provider]  diazepam (VALIUM) 5 MG tablet Take 1 tablet (5 mg total) by mouth every 8 (eight) hours as needed (dizziness). 05/11/13   Jola Schmidt, MD  ergocalciferol (VITAMIN D2) 1.25 MG (50000 UT) capsule Take 50,000 Units by mouth every 7 (seven) days. 01/23/19   [provider]  Ipratropium-Albuterol (COMBIVENT) 20-100 MCG/ACT AERS respimat Inhale 1 puff into the lungs. 07/19/16   [provider]  losartan (COZAAR) 25 MG tablet Take 25 mg by mouth daily. 10/15/19   [provider]  metFORMIN (GLUCOPHAGE-XR) 500 MG 24 hr tablet Take 500 mg by mouth daily. 10/17/19   [provider]  NARCAN 4 MG/0.1ML LIQD nasal spray kit SMARTSIG:1 Spray(s) Both Nares 1 to 2 Times Daily PRN 11/22/19   [provider]  nitroGLYCERIN (NITROSTAT) 0.4 MG SL tablet Place 1 tablet (0.4 mg total) under the tongue every 5 (five) minutes as needed. 11/13/19 02/11/20  Revankar, Reita Cliche, MD  ondansetron (ZOFRAN ODT) 8 MG disintegrating tablet Take 1 tablet (8 mg total)  by mouth every 8 (eight) hours as needed for nausea or vomiting. 05/11/13   Jola Schmidt, MD  OxyCODONE (OXYCONTIN) 10 mg T12A 12 hr tablet Take 10 mg by mouth every 12 (twelve) hours.    [provider]  oxyCODONE-acetaminophen (PERCOCET) 10-325 MG tablet Take 1 tablet by mouth every 8 (eight) hours. 11/23/19   [provider]  polyethylene glycol (MIRALAX / GLYCOLAX) 17 g packet Take 17 g by mouth daily for 30 doses. 12/15/19 01/14/20  Zacheriah Stumpe, DO  pravastatin (PRAVACHOL) 80 MG tablet Take 80 mg by mouth daily.    [provider]    Allergies    Acetaminophen  Review of Systems   Review of Systems  Constitutional: Negative for chills and fever.  HENT: Negative for ear pain  and sore throat.   Eyes: Negative for pain and visual disturbance.  Respiratory: Negative for cough and shortness of breath.   Cardiovascular: Negative for chest pain and palpitations.  Gastrointestinal: Positive for abdominal pain, constipation and vomiting (once). Negative for anal bleeding, diarrhea and nausea.  Genitourinary: Negative for dysuria and hematuria.  Musculoskeletal: Negative for arthralgias and back pain.  Skin: Negative for color change and rash.  Neurological: Negative for seizures and syncope.  All other systems reviewed and are negative.   Physical Exam Updated Vital Signs  ED Triage Vitals  Enc Vitals Group     BP 12/15/19 0045 118/75     Pulse Rate 12/15/19 0045 93     Resp 12/15/19 0045 17     Temp 12/15/19 0045 98.7 F (37.1 C)     Temp Source 12/15/19 0045 Oral     SpO2 12/15/19 0045 97 %     Weight 12/15/19 0046 170 lb (77.1 kg)     Height 12/15/19 0046 '5\' 2"'  (1.575 m)     Head Circumference --      Peak Flow --      Pain Score 12/15/19 0046 7     Pain Loc --      Pain Edu? --      Excl. in [place]? --     Physical Exam Vitals and nursing note reviewed.  Constitutional:      General: She is not in acute distress.    Appearance: She is well-developed and well-nourished.  HENT:     Head: Normocephalic and atraumatic.  Eyes:     Conjunctiva/sclera: Conjunctivae normal.  Cardiovascular:     Rate and Rhythm: Normal rate and regular rhythm.     Heart sounds: No murmur heard.   Pulmonary:     Effort: Pulmonary effort is normal. No respiratory distress.     Breath sounds: Normal breath sounds.  Abdominal:     General: There is no distension.     Palpations: Abdomen is soft.     Tenderness: There is no abdominal tenderness. There is no right CVA tenderness, guarding or rebound. Negative signs include Murphy's sign and Rovsing's sign.     Hernia: A hernia is present. Hernia is present in the umbilical area (reducible (chronic)).  Musculoskeletal:         General: No edema.     Cervical back: Neck supple.  Skin:    General: Skin is warm and dry.  Neurological:     Mental Status: She is alert.  Psychiatric:        Mood and Affect: Mood and affect normal.     ED Results / Procedures / Treatments   Labs (all labs ordered  are listed, but only abnormal results are displayed) Labs Reviewed - No data to display  EKG None  Radiology No results found.  Procedures Procedures (including critical care time)  Medications Ordered in ED Medications - No data to display  ED Course  I have reviewed the triage vital signs and the nursing notes.  Pertinent labs & imaging results that were available during my care of the patient were reviewed by me and considered in my medical decision making (see chart for details).    MDM Rules/Calculators/A&P                          Abigael Mogle is here for abdominal pain, constipation.  Normal vitals.  No fever.  Had some hard pellet-like stool today.  Had taken a laxative but not much relief.  She actually threw up the laxative.  She is passing gas, not having any other active nausea vomiting.  No real abdominal pain on exam does have some discomfort and crampiness every once in a while.  Have a very low suspicion for bowel obstruction or diverticulitis.  Overall symptoms appear more consistent with constipation.  Will prescribe MiraLAX.  Shared decision was made to hold off on any laboratory work or imaging given that it appears that likely she is having some gas pains from constipation.  She overall appears well and is not having any active pain or active symptoms and understands return precautions.  Discharged in good condition.  This chart was dictated using voice recognition software.  Despite best efforts to proofread,  errors can occur which can change the documentation meaning.     Final Clinical Impression(s) / ED Diagnoses Final diagnoses:  Generalized abdominal pain  Constipation,  unspecified constipation type    Rx / DC Orders ED Discharge Orders         Ordered    polyethylene glycol (MIRALAX / GLYCOLAX) 17 g packet  Daily        12/15/19 0058           Lennice Sites, DO 12/15/19 0101

## 2019-12-15 NOTE — ED Triage Notes (Signed)
Patient arrived via POV c/o abdominal pain x 1 day. Patient states reduced appetite x 2 days prior. Patient states 1 episode of emesis yesterday after taking oral laxative. Patient is AO x 4, VS WDL, normal gait.

## 2020-02-22 DIAGNOSIS — I10 Essential (primary) hypertension: Secondary | ICD-10-CM | POA: Insufficient documentation

## 2020-02-22 DIAGNOSIS — E7800 Pure hypercholesterolemia, unspecified: Secondary | ICD-10-CM | POA: Insufficient documentation

## 2020-02-25 ENCOUNTER — Ambulatory Visit (INDEPENDENT_AMBULATORY_CARE_PROVIDER_SITE_OTHER): Payer: Medicare Other | Admitting: Cardiology

## 2020-02-25 ENCOUNTER — Encounter: Payer: Self-pay | Admitting: Cardiology

## 2020-02-25 ENCOUNTER — Other Ambulatory Visit: Payer: Self-pay

## 2020-02-25 VITALS — BP 138/92 | HR 80 | Ht 68.0 in | Wt 166.1 lb

## 2020-02-25 DIAGNOSIS — I209 Angina pectoris, unspecified: Secondary | ICD-10-CM

## 2020-02-25 DIAGNOSIS — E7800 Pure hypercholesterolemia, unspecified: Secondary | ICD-10-CM | POA: Diagnosis not present

## 2020-02-25 DIAGNOSIS — F17200 Nicotine dependence, unspecified, uncomplicated: Secondary | ICD-10-CM

## 2020-02-25 DIAGNOSIS — I10 Essential (primary) hypertension: Secondary | ICD-10-CM | POA: Diagnosis not present

## 2020-02-25 DIAGNOSIS — E119 Type 2 diabetes mellitus without complications: Secondary | ICD-10-CM | POA: Diagnosis not present

## 2020-02-25 NOTE — Progress Notes (Signed)
Cardiology Office Note:    Date:  02/25/2020   ID:  Sabrina Swanson, DOB 11/21/1960, MRN [id number]  PCP:  Fortino Sic, PA  Cardiologist:  Jenean Lindau, MD   Referring MD: Fortino Sic, *    ASSESSMENT:    1. Essential hypertension   2. Hypercholesterolemia   3. Diabetes mellitus without complication ([name])   4. Tobacco use disorder    PLAN:    In order of problems listed above:  1. Primary prevention stressed with the patient.  Importance of compliance with diet medication stressed and she vocalized understanding. 2. Essential hypertension: Blood pressure stable and diet was emphasized.  She mentions to me that she took her blood pressure medicine late this morning.  I told her to keep a track of her blood pressures and let us know. 3. Mixed dyslipidemia: On statin therapy and diet was emphasized and she promises to do better.  Lipids followed by primary care provider. 4. Diabetes mellitus: Diet emphasized and again this is followed by primary care.  She will be scheduled for stress test and will be seen in follow-up appointment in 3 months or earlier if she has any concerns.  She knows to go to the nearest emergency room for any concerning issues.   Medication Adjustments/Labs and Tests Ordered: Current medicines are reviewed at length with the patient today.  Concerns regarding medicines are outlined above.  No orders of the defined types were placed in this encounter.  No orders of the defined types were placed in this encounter.    No chief complaint on file.    History of Present Illness:    Sabrina Swanson is a 60 y.o. female.  Patient has past medical history of essential hypertension, diabetes mellitus and dyslipidemia.  She denies any problems at this time and takes care of activities of daily living.  No chest pain orthopnea or PND.  Echocardiogram was unremarkable.  She did not complete a stress test because she was getting a cold she tells  me.  She is willing to reschedule and get it done again.  At the time of my evaluation, the patient is alert awake oriented and in no distress.  Past Medical History:  Diagnosis Date  . Acquired trigger finger 06/10/2012  . Alopecia 05/16/2013  . Angina pectoris ([place]) 11/13/2019  . Anxiety state 06/10/2012  . Arthritis of right acromioclavicular joint 11/27/2018  . Back pain   . Benign neoplasm of colon 06/10/2012  . Bursitis disorder 05/11/2011  . Cardiac murmur 11/13/2019  . Carpal tunnel syndrome 06/10/2012   Formatting of this note might be different from the original. bilateral  . Chalazion right upper eyelid 06/30/2015  . Continuous dependence on cigarette smoking 11/13/2019  . COPD (chronic obstructive pulmonary disease) ([place]) 05/16/2013  . DDD (degenerative disc disease), lumbosacral 06/10/2012  . Depressive disorder 06/10/2012  . Diabetes mellitus without complication ([place])   . Dyspareunia 03/04/2014  . Dysthymia 06/10/2012  . Encounter for long-term (current) use of other medications 06/10/2012  . Essential hypertension 08/14/2019  . Generalized anxiety disorder 06/10/2012  . Hammer toe of right foot 10/23/2014   Formatting of this note might be different from the original. Second and third tarsals Formatting of this note might be different from the original. Formatting of this note might be different from the original. Second and third tarsals  . High cholesterol   . Hypercholesterolemia 03/06/2013  . Hypertension   . Insomnia 06/10/2012  . Lumbosacral spondylosis 06/10/2012  .  Lump of skin 10/23/2014   Formatting of this note might be different from the original. Right breast-manipulated by patient prior to clinical exam  . Migraine syndrome 06/10/2012  . Numbness of toes 10/23/2014  . Osteoarthrosis, generalized, involving multiple sites 06/10/2012  . Pain in soft tissues of limb 06/10/2012   Formatting of this note might be different from the original. 03/09/11 right arm.  . Pain in toes of both feet  10/23/2014  . Patellar tendinitis 06/10/2012  . Persistent lymphocytosis 05/25/2014  . Plantar fascial fibromatosis 06/10/2012  . Risk for falls 03/05/2015  . Sensorineural hearing loss 06/10/2012  . Shoulder impingement, right 11/27/2018  . Subacute vaginitis 07/18/2015  . Tendinopathy of rotator cuff, right 11/27/2018  . Tinnitus 03/21/2013  . Tobacco use disorder 03/06/2013  . Type 2 diabetes, controlled, with neuropathy ([place]) 10/21/2014  . Unspecified cataract 06/30/2015  . Vertigo 06/14/2013  . Vestibular dizziness 06/10/2012    Past Surgical History:  Procedure Laterality Date  . ABDOMINAL HYSTERECTOMY    . CESAREAN SECTION      Current Medications: Current Meds  Medication Sig  . albuterol (VENTOLIN HFA) 108 (90 Base) MCG/ACT inhaler Inhale 2 puffs into the lungs every 4 (four) hours as needed for shortness of breath.  Marland Kitchen aspirin 81 MG EC tablet Take 81 mg by mouth daily.  . diazepam (VALIUM) 5 MG tablet Take 1 tablet (5 mg total) by mouth every 8 (eight) hours as needed (dizziness).  . ergocalciferol (VITAMIN D2) 1.25 MG (50000 UT) capsule Take 50,000 Units by mouth every 7 (seven) days.  . Ipratropium-Albuterol (COMBIVENT) 20-100 MCG/ACT AERS respimat Inhale 1 puff into the lungs daily in the afternoon.  Marland Kitchen losartan (COZAAR) 25 MG tablet Take 25 mg by mouth daily.  . metFORMIN (GLUCOPHAGE-XR) 500 MG 24 hr tablet Take 500 mg by mouth daily.  Marland Kitchen NARCAN 4 MG/0.1ML LIQD nasal spray kit Place 4 mg into the nose once.  . nitroGLYCERIN (NITROSTAT) 0.4 MG SL tablet Place 0.4 mg under the tongue every 5 (five) minutes as needed for chest pain.  Marland Kitchen ondansetron (ZOFRAN ODT) 8 MG disintegrating tablet Take 1 tablet (8 mg total) by mouth every 8 (eight) hours as needed for nausea or vomiting.  . OxyCODONE (OXYCONTIN) 10 mg T12A 12 hr tablet Take 10 mg by mouth every 12 (twelve) hours.  . pravastatin (PRAVACHOL) 80 MG tablet Take 80 mg by mouth daily.     Allergies:   Acetaminophen   Social History    Socioeconomic History  . Marital status: Single    Spouse name: Not on file  . Number of children: Not on file  . Years of education: Not on file  . Highest education level: Not on file  Occupational History  . Not on file  Tobacco Use  . Smoking status: Current Every Day Smoker    Packs/day: 1.00    Types: Cigarettes  . Smokeless tobacco: Never Used  Vaping Use  . Vaping Use: Never used  Substance and Sexual Activity  . Alcohol use: Yes    Comment: occasional  . Drug use: No    Comment: did eat marijuana brownie last week  . Sexual activity: Not on file  Other Topics Concern  . Not on file  Social History Narrative  . Not on file   Social Determinants of Health   Financial Resource Strain: Not on file  Food Insecurity: Not on file  Transportation Needs: Not on file  Physical Activity: Not on file  Stress: Not on file  Social Connections: Not on file     Family History: The patient's family history includes Diabetes in her maternal grandfather; Hypertension in her maternal grandmother; Stomach cancer in her mother.  ROS:   Please see the history of present illness.    All other systems reviewed and are negative.  EKGs/Labs/Other Studies Reviewed:    The following studies were reviewed today: IMPRESSIONS    1. Left ventricular ejection fraction, by estimation, is 60 to 65%. The  left ventricle has normal function. The left ventricle has no regional  wall motion abnormalities. Left ventricular diastolic parameters were  normal.  2. Right ventricular systolic function is normal. The right ventricular  size is normal. Tricuspid regurgitation signal is inadequate for assessing  PA pressure.  3. The mitral valve is grossly normal. Trivial mitral valve  regurgitation. No evidence of mitral stenosis.  4. The aortic valve is tricuspid. Aortic valve regurgitation is not  visualized. No aortic stenosis is present.  5. The inferior vena cava is normal in size  with greater than 50%  respiratory variability, suggesting right atrial pressure of 3 mmHg.   Recent Labs: 10/28/2019: BUN 13; Creatinine, Ser 0.98; Hemoglobin 14.2; Platelets 255; Potassium 3.9; Sodium 140  Recent Lipid Panel No results found for: CHOL, TRIG, HDL, CHOLHDL, VLDL, LDLCALC, LDLDIRECT  Physical Exam:    VS:  BP (!) 138/92   Pulse 80   Ht '5\' 8"'  (1.727 m)   Wt 166 lb 1.3 oz (75.3 kg)   SpO2 95%   BMI 25.25 kg/m     Wt Readings from Last 3 Encounters:  02/25/20 166 lb 1.3 oz (75.3 kg)  12/15/19 170 lb (77.1 kg)  12/06/19 171 lb (77.6 kg)     GEN: Patient is in no acute distress HEENT: Normal NECK: No JVD; No carotid bruits LYMPHATICS: No lymphadenopathy CARDIAC: Hear sounds regular, 2/6 systolic murmur at the apex. RESPIRATORY:  Clear to auscultation without rales, wheezing or rhonchi  ABDOMEN: Soft, non-tender, non-distended MUSCULOSKELETAL:  No edema; No deformity  SKIN: Warm and dry NEUROLOGIC:  Alert and oriented x 3 PSYCHIATRIC:  Normal affect   Signed, Jenean Lindau, MD  02/25/2020 10:10 AM    [place]

## 2020-02-25 NOTE — Patient Instructions (Addendum)
Medication Instructions:  No medication changes. *If you need a refill on your cardiac medications before your next appointment, please call your pharmacy*   Lab Work: None ordered If you have labs (blood work) drawn today and your tests are completely normal, you will receive your results only by: Marland Kitchen MyChart Message (if you have MyChart) OR . A paper copy in the mail If you have any lab test that is abnormal or we need to change your treatment, we will call you to review the results.   Testing/Procedures: Your physician has requested that you have a lexiscan myoview. For further information please visit [email or web address]. Please follow instruction sheet, as given.  The test will take approximately 3 to 4 hours to complete; you may bring reading material.  If someone comes with you to your appointment, they will need to remain in the main lobby due to limited space in the testing area.   How to prepare for your Myocardial Perfusion Test: . Do not eat or drink 3 hours prior to your test, except you may have water. . Do not consume products containing caffeine (regular or decaffeinated) 12 hours prior to your test. (ex: coffee, chocolate, sodas, tea). . Do bring a list of your current medications with you.  If not listed below, you may take your medications as normal. . Do wear comfortable clothes (no dresses or overalls) and walking shoes, tennis shoes preferred (No heels or open toe shoes are allowed). . Do NOT wear cologne, perfume, aftershave, or lotions (deodorant is allowed). . If these instructions are not followed, your test will have to be rescheduled.   Follow-Up: At [facility], you and your health needs are our priority.  As part of our continuing mission to provide you with exceptional heart care, we have created designated Provider Care Teams.  These Care Teams include your primary Cardiologist (physician) and Advanced Practice Providers (APPs -  Physician Assistants and  Nurse Practitioners) who all work together to provide you with the care you need, when you need it.  We recommend signing up for the patient portal called "MyChart".  Sign up information is provided on this After Visit Summary.  MyChart is used to connect with patients for Virtual Visits (Telemedicine).  Patients are able to view lab/test results, encounter notes, upcoming appointments, etc.  Non-urgent messages can be sent to your provider as well.   To learn more about what you can do with MyChart, go to [email or web address].    Your next appointment:   3 month(s)  The format for your next appointment:   In Person  Provider:   Jyl Heinz, MD   Other Instructions NA

## 2020-03-04 NOTE — Addendum Note (Signed)
Addended by: Jyl Heinz R on: 03/04/2020 02:45 PM   Modules accepted: Orders

## 2020-03-06 ENCOUNTER — Telehealth (HOSPITAL_COMMUNITY): Payer: Self-pay

## 2020-03-06 NOTE — Telephone Encounter (Signed)
Detailed instructions left on the patient's answering machine. Asked to call back with any questions. S.Rhyatt Muska EMTP 

## 2020-03-11 ENCOUNTER — Encounter (HOSPITAL_COMMUNITY): Payer: Medicare Other

## 2020-04-07 ENCOUNTER — Encounter (HOSPITAL_COMMUNITY): Payer: Self-pay | Admitting: Cardiology

## 2020-04-09 ENCOUNTER — Telehealth (HOSPITAL_COMMUNITY): Payer: Self-pay

## 2020-04-09 NOTE — Telephone Encounter (Signed)
Close encounter 

## 2020-04-11 ENCOUNTER — Ambulatory Visit (HOSPITAL_COMMUNITY)
Admission: RE | Admit: 2020-04-11 | Discharge: 2020-04-11 | Disposition: A | Discharge status: Home / Self Care | Payer: Medicare Other | Source: Ambulatory Visit | Attending: Cardiovascular Disease | Admitting: Cardiovascular Disease

## 2020-04-11 ENCOUNTER — Other Ambulatory Visit: Payer: Self-pay

## 2020-04-11 DIAGNOSIS — E119 Type 2 diabetes mellitus without complications: Secondary | ICD-10-CM | POA: Insufficient documentation

## 2020-04-11 DIAGNOSIS — I208 Other forms of angina pectoris: Secondary | ICD-10-CM | POA: Insufficient documentation

## 2020-04-11 DIAGNOSIS — I209 Angina pectoris, unspecified: Secondary | ICD-10-CM | POA: Diagnosis present

## 2020-04-11 LAB — MYOCARDIAL PERFUSION IMAGING
LV dias vol: 108 mL (ref 46–106)
LV sys vol: 56 mL
Peak HR: 105 {beats}/min
Rest HR: 68 {beats}/min
SDS: 3
SRS: 0
SSS: 3
TID: 1.06

## 2020-04-11 MED ORDER — TECHNETIUM TC 99M TETROFOSMIN IV KIT
30.1000 | PACK | Freq: Once | INTRAVENOUS | Status: AC | PRN
  Administered 2020-04-11: 30.1 via INTRAVENOUS
  Filled 2020-04-11: qty 31

## 2020-04-11 MED ORDER — REGADENOSON 0.4 MG/5ML IV SOLN
0.4000 mg | Freq: Once | INTRAVENOUS | Status: AC
  Administered 2020-04-11: 0.4 mg via INTRAVENOUS

## 2020-04-11 MED ORDER — TECHNETIUM TC 99M TETROFOSMIN IV KIT
10.4000 | PACK | Freq: Once | INTRAVENOUS | Status: AC | PRN
  Administered 2020-04-11: 10.4 via INTRAVENOUS
  Filled 2020-04-11: qty 11

## 2020-04-16 ENCOUNTER — Encounter: Payer: Self-pay | Admitting: Cardiology

## 2020-04-16 ENCOUNTER — Telehealth: Payer: Self-pay | Admitting: Cardiology

## 2020-04-16 ENCOUNTER — Ambulatory Visit (INDEPENDENT_AMBULATORY_CARE_PROVIDER_SITE_OTHER): Payer: Medicare Other | Admitting: Cardiology

## 2020-04-16 ENCOUNTER — Other Ambulatory Visit: Payer: Self-pay

## 2020-04-16 VITALS — BP 154/90 | HR 83 | Ht 64.0 in | Wt 165.0 lb

## 2020-04-16 DIAGNOSIS — Z720 Tobacco use: Secondary | ICD-10-CM

## 2020-04-16 DIAGNOSIS — I10 Essential (primary) hypertension: Secondary | ICD-10-CM | POA: Diagnosis not present

## 2020-04-16 DIAGNOSIS — R9439 Abnormal result of other cardiovascular function study: Secondary | ICD-10-CM

## 2020-04-16 DIAGNOSIS — E782 Mixed hyperlipidemia: Secondary | ICD-10-CM

## 2020-04-16 DIAGNOSIS — E0800 Diabetes mellitus due to underlying condition with hyperosmolarity without nonketotic hyperglycemic-hyperosmolar coma (NKHHC): Secondary | ICD-10-CM

## 2020-04-16 HISTORY — DX: Abnormal result of other cardiovascular function study: R94.39

## 2020-04-16 HISTORY — DX: Mixed hyperlipidemia: E78.2

## 2020-04-16 HISTORY — DX: Tobacco use: Z72.0

## 2020-04-16 HISTORY — DX: Diabetes mellitus due to underlying condition with hyperosmolarity without nonketotic hyperglycemic-hyperosmolar coma (NKHHC): E08.00

## 2020-04-16 MED ORDER — LOSARTAN POTASSIUM 25 MG PO TABS: 25.0000 mg | ORAL_TABLET | Freq: Two times a day (BID) | ORAL | 3 refills | Status: DC

## 2020-04-16 NOTE — Progress Notes (Signed)
Cardiology Office Note:    Date:  04/16/2020   ID:  Sabrina Swanson, DOB 04/20/1960, MRN [id number]  PCP:  Lim, Sheri, DO  Cardiologist:  No primary care provider on file.  Electrophysiologist:  None   Referring MD: Wright, Morgan Dionne, *   I am doing fine  History of Present Illness:    Sabrina Swanson is a 60 y.o. female with a hx of hypertension, hyperlipidemia, diabetes mellitus, smoker who follows with Dr. Revankar and was last seen by him in February 2022.  At that time a nuclear stress test was ordered.  The patient did get her nuclear stress test which showed evidence of ischemia.  She is here today to discuss her testing results with me given the fact that Dr. Revankar is on vacation.  She offers no complaints at this time.  Past Medical History:  Diagnosis Date  . Acquired trigger finger 06/10/2012  . Alopecia 05/16/2013  . Angina pectoris (HCC) 11/13/2019  . Anxiety state 06/10/2012  . Arthritis of right acromioclavicular joint 11/27/2018  . Back pain   . Benign neoplasm of colon 06/10/2012  . Bursitis disorder 05/11/2011  . Cardiac murmur 11/13/2019  . Carpal tunnel syndrome 06/10/2012   Formatting of this note might be different from the original. bilateral  . Chalazion right upper eyelid 06/30/2015  . Continuous dependence on cigarette smoking 11/13/2019  . COPD (chronic obstructive pulmonary disease) (HCC) 05/16/2013  . DDD (degenerative disc disease), lumbosacral 06/10/2012  . Depressive disorder 06/10/2012  . Diabetes mellitus without complication (HCC)   . Dyspareunia 03/04/2014  . Dysthymia 06/10/2012  . Encounter for long-term (current) use of other medications 06/10/2012  . Essential hypertension 08/14/2019  . Generalized anxiety disorder 06/10/2012  . Hammer toe of right foot 10/23/2014   Formatting of this note might be different from the original. Second and third tarsals Formatting of this note might be different from the original. Formatting of this note might be different  from the original. Second and third tarsals  . High cholesterol   . Hypercholesterolemia 03/06/2013  . Hypertension   . Insomnia 06/10/2012  . Lumbosacral spondylosis 06/10/2012  . Lump of skin 10/23/2014   Formatting of this note might be different from the original. Right breast-manipulated by patient prior to clinical exam  . Migraine syndrome 06/10/2012  . Numbness of toes 10/23/2014  . Osteoarthrosis, generalized, involving multiple sites 06/10/2012  . Pain in soft tissues of limb 06/10/2012   Formatting of this note might be different from the original. 03/09/11 right arm.  . Pain in toes of both feet 10/23/2014  . Patellar tendinitis 06/10/2012  . Persistent lymphocytosis 05/25/2014  . Plantar fascial fibromatosis 06/10/2012  . Risk for falls 03/05/2015  . Sensorineural hearing loss 06/10/2012  . Shoulder impingement, right 11/27/2018  . Subacute vaginitis 07/18/2015  . Tendinopathy of rotator cuff, right 11/27/2018  . Tinnitus 03/21/2013  . Tobacco use disorder 03/06/2013  . 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 Unspecified cataract 06/30/2015  . Vertigo 06/14/2013  . Vestibular dizziness 06/10/2012    Past Surgical History:  Procedure Laterality Date  . ABDOMINAL HYSTERECTOMY    . CESAREAN SECTION      Current Medications: Current Meds  Medication Sig  . albuterol (VENTOLIN HFA) 108 (90 Base) MCG/ACT inhaler Inhale 2 puffs into the lungs every 4 (four) hours as needed for shortness of breath.  . aspirin 81 MG EC tablet Take 81 mg by mouth daily.  . diazepam (VALIUM)   5 MG tablet Take 1 tablet (5 mg total) by mouth every 8 (eight) hours as needed (dizziness).  . ergocalciferol (VITAMIN D2) 1.25 MG (50000 UT) capsule Take 50,000 Units by mouth every 7 (seven) days.  . Ipratropium-Albuterol (COMBIVENT) 20-100 MCG/ACT AERS respimat Inhale 1 puff into the lungs daily in the afternoon.  . losartan (COZAAR) 25 MG tablet Take 1 tablet (25 mg total) by mouth in the morning and  at bedtime.  . metFORMIN (GLUCOPHAGE-XR) 500 MG 24 hr tablet Take 500 mg by mouth daily.  . NARCAN 4 MG/0.1ML LIQD nasal spray kit Place 4 mg into the nose once.  . nitroGLYCERIN (NITROSTAT) 0.4 MG SL tablet Place 0.4 mg under the tongue every 5 (five) minutes as needed for chest pain.  . ondansetron (ZOFRAN ODT) 8 MG disintegrating tablet Take 1 tablet (8 mg total) by mouth every 8 (eight) hours as needed for nausea or vomiting.  . OxyCODONE (OXYCONTIN) 10 mg T12A 12 hr tablet Take 10 mg by mouth every 12 (twelve) hours.  . pravastatin (PRAVACHOL) 80 MG tablet Take 80 mg by mouth daily.  . [DISCONTINUED] losartan (COZAAR) 25 MG tablet Take 25 mg by mouth daily.     Allergies:   Acetaminophen   Social History   Socioeconomic History  . Marital status: Single    Spouse name: Not on file  . Number of children: Not on file  . Years of education: Not on file  . Highest education level: Not on file  Occupational History  . Not on file  Tobacco Use  . Smoking status: Current Every Day Smoker    Packs/day: 1.00    Types: Cigarettes  . Smokeless tobacco: Never Used  Vaping Use  . Vaping Use: Never used  Substance and Sexual Activity  . Alcohol use: Yes    Comment: occasional  . Drug use: No    Comment: did eat marijuana brownie last week  . Sexual activity: Not on file  Other Topics Concern  . Not on file  Social History Narrative  . Not on file   Social Determinants of Health   Financial Resource Strain: Not on file  Food Insecurity: Not on file  Transportation Needs: Not on file  Physical Activity: Not on file  Stress: Not on file  Social Connections: Not on file     Family History: The patient's family history includes Diabetes in her maternal grandfather; Hypertension in her maternal grandmother; Stomach cancer in her mother.  ROS:   Review of Systems  Constitution: Negative for decreased appetite, fever and weight gain.  HENT: Negative for congestion, ear  discharge, hoarse voice and sore throat.   Eyes: Negative for discharge, redness, vision loss in right eye and visual halos.  Cardiovascular: Negative for chest pain, dyspnea on exertion, leg swelling, orthopnea and palpitations.  Respiratory: Negative for cough, hemoptysis, shortness of breath and snoring.   Endocrine: Negative for heat intolerance and polyphagia.  Hematologic/Lymphatic: Negative for bleeding problem. Does not bruise/bleed easily.  Skin: Negative for flushing, nail changes, rash and suspicious lesions.  Musculoskeletal: Negative for arthritis, joint pain, muscle cramps, myalgias, neck pain and stiffness.  Gastrointestinal: Negative for abdominal pain, bowel incontinence, diarrhea and excessive appetite.  Genitourinary: Negative for decreased libido, genital sores and incomplete emptying.  Neurological: Negative for brief paralysis, focal weakness, headaches and loss of balance.  Psychiatric/Behavioral: Negative for altered mental status, depression and suicidal ideas.  Allergic/Immunologic: Negative for HIV exposure and persistent infections.    EKGs/Labs/Other Studies   Reviewed:    The following studies were reviewed today:   EKG:  The ekg ordered today demonstrates    Pharmacologic nuclear stress test April 11, 2020  The left ventricular ejection fraction is mildly decreased (45-54%).  Nuclear stress EF: 48%.  Downsloping ST segment depression ST segment depression was noted during stress in the II, III, aVF and V6 leads, and returning to baseline after 5-9 minutes of recovery.  Defect 1: There is a medium defect of moderate severity present in the basal inferoseptal, mid inferoseptal and mid inferior location.  Defect 2: There is a large defect of moderate severity present in the basal anterior, mid anterior and apical anterior location.  Findings consistent with ischemia.  This is an intermediate risk study.   ECG changes with lexiscan infusion (downsloping  ST depressions). Resting images have diffuse diminished uptake in the anterior wall not seen on stress imaging, most consistent with artifact given normal wall motion in the area. There is however a defect in the basal to mid inferior/inferoseptal wall at stress, not seen at rest, consistent with ischemia.    Recent Labs: 10/28/2019: BUN 13; Creatinine, Ser 0.98; Hemoglobin 14.2; Platelets 255; Potassium 3.9; Sodium 140  Recent Lipid Panel No results found for: CHOL, TRIG, HDL, CHOLHDL, VLDL, LDLCALC, LDLDIRECT  Physical Exam:    VS:  BP (!) 154/90   Pulse 83   Ht 5' 4" (1.626 m)   Wt 165 lb (74.8 kg)   SpO2 100%   BMI 28.32 kg/m     Wt Readings from Last 3 Encounters:  04/16/20 165 lb (74.8 kg)  04/11/20 166 lb (75.3 kg)  02/25/20 166 lb 1.3 oz (75.3 kg)     GEN: Well nourished, well developed in no acute distress HEENT: Normal NECK: No JVD; No carotid bruits LYMPHATICS: No lymphadenopathy CARDIAC: S1S2 noted,RRR, no murmurs, rubs, gallops RESPIRATORY:  Clear to auscultation without rales, wheezing or rhonchi  ABDOMEN: Soft, non-tender, non-distended, +bowel sounds, no guarding. EXTREMITIES: No edema, No cyanosis, no clubbing MUSCULOSKELETAL:  No deformity  SKIN: Warm and dry NEUROLOGIC:  Alert and oriented x 3, non-focal PSYCHIATRIC:  Normal affect, good insight  ASSESSMENT:    1. Abnormal stress test   2. Tobacco abuse   3. Hypertension, unspecified type   4. Diabetes mellitus due to underlying condition with hyperosmolarity without coma, without long-term current use of insulin (HCC)   5. Mixed hyperlipidemia    PLAN:     I discussed with the patient that her stress test was abnormal showing evidence of ischemia.  I did explain to her that the next best step is that heart catheterization likely due to significant blockages.  The patient understands that risks include but are not limited to stroke (1 in 1000), death (1 in 1000), kidney failure [usually temporary]  (1 in 500), bleeding (1 in 200), allergic reaction [possibly serious] (1 in 200), and agrees to proceed.  She is hypertensive in the office today, I also did take her blood pressure manually.  Her target is less than 130/80 mmHg.  At this time she is agreeable to increase her losartan to 25 mg twice a day.  She will continue on her pravastatin.  This is being managed by his primary care doctor.  No adjustments for antidiabetic medications were made today.  Smoking cessation advised  The patient is in agreement with the above plan. The patient left the office in stable condition.  The patient will follow up in 4 weeks with Dr.   Revankar   Medication Adjustments/Labs and Tests Ordered: Current medicines are reviewed at length with the patient today.  Concerns regarding medicines are outlined above.  Orders Placed This Encounter  Procedures  . Basic metabolic panel  . CBC with Differential/Platelet  . Magnesium   Meds ordered this encounter  Medications  . losartan (COZAAR) 25 MG tablet    Sig: Take 1 tablet (25 mg total) by mouth in the morning and at bedtime.    Dispense:  90 tablet    Refill:  3    Patient Instructions  Medication Instructions:  Your physician has recommended you make the following change in your medication: START: Losartan 25 mg twice daily *If you need a refill on your cardiac medications before your next appointment, please call your pharmacy*   Lab Work: Your physician recommends that you return for lab work in:  TODAY: BMET, Mag, CBC If you have labs (blood work) drawn today and your tests are completely normal, you will receive your results only by: . MyChart Message (if you have MyChart) OR . A paper copy in the mail If you have any lab test that is abnormal or we need to change your treatment, we will call you to review the results.   Testing/Procedures:    [facility] [place] [address], [address] [place] [address] Dept: [phone] Loc: [phone]  Signa Meineke  04/16/2020  You are scheduled for a Cardiac Catheterization on Tuesday, April 19 with Dr. Thomas Kelly.  1. Please arrive at the [facility] (Main Entrance A) at [facility]: [address] [place], [place] [address] at 5:30 AM (This time is two hours before your procedure to ensure your preparation). Free valet parking service is available.   Special note: Every effort is made to have your procedure done on time. Please understand that emergencies sometimes delay scheduled procedures.  2. Diet: Do not eat solid foods after midnight.  The patient may have clear liquids until 5am upon the day of the procedure.  3. Labs: You will need to have blood drawn on  4. Medication instructions in preparation for your procedure:   Contrast Allergy: No   Do not take Diabetes Med Glucophage (Metformin) on the day of the procedure and HOLD 48 HOURS AFTER THE PROCEDURE.  On the morning of your procedure, take your Aspirin and any morning medicines NOT listed above.  You may use sips of water.  5. Plan for one night stay--bring personal belongings. 6. Bring a current list of your medications and current insurance cards. 7. You MUST have a responsible person to drive you home. 8. Someone MUST be with you the first 24 hours after you arrive home or your discharge will be delayed. 9. Please wear clothes that are easy to get on and off and wear slip-on shoes.  Thank you for allowing us to care for you!   -- [place] [facility]    Follow-Up: At [facility], you and your health needs are our priority.  As part of our continuing mission to provide you with exceptional heart care, we have created designated Provider Care Teams.  These Care Teams include your primary Cardiologist (physician) and Advanced Practice Providers (APPs -  Physician Assistants  and Nurse Practitioners) who all work together to provide you with the care you need, when you need it.  We recommend signing up for the patient portal called "MyChart".    Sign up information is provided on this After Visit Summary.  MyChart is used to connect with patients for Virtual Visits (Telemedicine).  Patients are able to view lab/test results, encounter notes, upcoming appointments, etc.  Non-urgent messages can be sent to your provider as well.   To learn more about what you can do with MyChart, go to [email or web address].    Your next appointment:   4 week(s)  The format for your next appointment:   In Person  Provider:   Rajan Revankar, MD   Other Instructions      Adopting a Healthy Lifestyle.  Know what a healthy weight is for you (roughly BMI <25) and aim to maintain this   Aim for 7+ servings of fruits and vegetables daily   65-80+ fluid ounces of water or unsweet tea for healthy kidneys   Limit to max 1 drink of alcohol per day; avoid smoking/tobacco   Limit animal fats in diet for cholesterol and heart health - choose grass fed whenever available   Avoid highly processed foods, and foods high in saturated/trans fats   Aim for low stress - take time to unwind and care for your mental health   Aim for 150 min of moderate intensity exercise weekly for heart health, and weights twice weekly for bone health   Aim for 7-9 hours of sleep daily   When it comes to diets, agreement about the perfect plan isnt easy to find, even among the experts. Experts at the [employer] developed an idea known as the Healthy Eating Plate. Just imagine a plate divided into logical, healthy portions.   The emphasis is on diet quality:   Load up on vegetables and fruits - one-half of your plate: Aim for color and variety, and remember that potatoes dont count.   Go for whole grains - one-quarter of your plate: Whole wheat, barley, wheat berries, quinoa,  oats, brown rice, and foods made with them. If you want pasta, go with whole wheat pasta.   Protein power - one-quarter of your plate: Fish, chicken, beans, and nuts are all healthy, versatile protein sources. Limit red meat.   The diet, however, does go beyond the plate, offering a few other suggestions.   Use healthy plant oils, such as olive, canola, soy, corn, sunflower and peanut. Check the labels, and avoid partially hydrogenated oil, which have unhealthy trans fats.   If youre thirsty, drink water. Coffee and tea are good in moderation, but skip sugary drinks and limit milk and dairy products to one or two daily servings.   The type of carbohydrate in the diet is more important than the amount. Some sources of carbohydrates, such as vegetables, fruits, whole grains, and beans-are healthier than others.   Finally, stay active  Signed, Yaden Seith, DO  04/16/2020 11:37 AM    [facility] 

## 2020-04-16 NOTE — Telephone Encounter (Signed)
Called pt back went over cath instructions for eating and drinking. Pt verbalized understanding and had no additional questions.

## 2020-04-16 NOTE — Patient Instructions (Signed)
Medication Instructions:  Your physician has recommended you make the following change in your medication: START: Losartan 25 mg twice daily *If you need a refill on your cardiac medications before your next appointment, please call your pharmacy*   Lab Work: Your physician recommends that you return for lab work in:  TODAY: BMET, Mag, CBC If you have labs (blood work) drawn today and your tests are completely normal, you will receive your results only by: Marland Kitchen MyChart Message (if you have MyChart) OR . A paper copy in the mail If you have any lab test that is abnormal or we need to change your treatment, we will call you to review the results.   Testing/Procedures:    [place], [place] [address] Dept: [phone] Loc: [phone]  Sabrina Swanson  04/16/2020  You are scheduled for a Cardiac Catheterization on Tuesday, April 19 with Dr. Shelva Majestic.  1. Please arrive at the [facility] (Main Entrance A) at [facility]: [address] [place], [place] [address] at 5:30 AM (This time is two hours before your procedure to ensure your preparation). Free valet parking service is available.   Special note: Every effort is made to have your procedure done on time. Please understand that emergencies sometimes delay scheduled procedures.  2. Diet: Do not eat solid foods after midnight.  The patient may have clear liquids until 5am upon the day of the procedure.  3. Labs: You will need to have blood drawn on  4. Medication instructions in preparation for your procedure:   Contrast Allergy: No   Do not take Diabetes Med Glucophage (Metformin) on the day of the procedure and HOLD 48 HOURS AFTER THE PROCEDURE.  On the morning of your procedure, take your Aspirin and any morning medicines NOT listed above.  You may use sips of water.  5. Plan for one night  stay--bring personal belongings. 6. Bring a current list of your medications and current insurance cards. 7. You MUST have a responsible person to drive you home. 8. Someone MUST be with you the first 24 hours after you arrive home or your discharge will be delayed. 9. Please wear clothes that are easy to get on and off and wear slip-on shoes.  Thank you for allowing [place] to care for you!   -- [place] [facility]    Follow-Up: At [facility], you and your health needs are our priority.  As part of our continuing mission to provide you with exceptional heart care, we have created designated Provider Care Teams.  These Care Teams include your primary Cardiologist (physician) and Advanced Practice Providers (APPs -  Physician Assistants and Nurse Practitioners) who all work together to provide you with the care you need, when you need it.  We recommend signing up for the patient portal called "MyChart".  Sign up information is provided on this After Visit Summary.  MyChart is used to connect with patients for Virtual Visits (Telemedicine).  Patients are able to view lab/test results, encounter notes, upcoming appointments, etc.  Non-urgent messages can be sent to your provider as well.   To learn more about what you can do with MyChart, go to [email or web address].    Your next appointment:   4 week(s)  The format for your next appointment:   In Person  Provider:   Jyl Heinz, MD   Other Instructions

## 2020-04-16 NOTE — H&P (View-Only) (Signed)
Cardiology Office Note:    Date:  04/16/2020   ID:  Sabrina Swanson, DOB [birth year], MRN [id number]  PCP:  Wynelle Fanny, DO  Cardiologist:  No primary care provider on file.  Electrophysiologist:  None   Referring MD: Fortino Sic, *   I am doing fine  History of Present Illness:    Sabrina Swanson is a 60 y.o. female with a hx of hypertension, hyperlipidemia, diabetes mellitus, smoker who follows with Dr. Geraldo Pitter and was last seen by him in February 2022.  At that time a nuclear stress test was ordered.  The patient did get her nuclear stress test which showed evidence of ischemia.  She is here today to discuss her testing results with me given the fact that Dr. Geraldo Pitter is on vacation.  She offers no complaints at this time.  Past Medical History:  Diagnosis Date  . Acquired trigger finger 06/10/2012  . Alopecia 05/16/2013  . Angina pectoris ([place]) 11/13/2019  . Anxiety state 06/10/2012  . Arthritis of right acromioclavicular joint 11/27/2018  . Back pain   . Benign neoplasm of colon 06/10/2012  . Bursitis disorder 05/11/2011  . Cardiac murmur 11/13/2019  . Carpal tunnel syndrome 06/10/2012   Formatting of this note might be different from the original. bilateral  . Chalazion right upper eyelid 06/30/2015  . Continuous dependence on cigarette smoking 11/13/2019  . COPD (chronic obstructive pulmonary disease) ([place]) 05/16/2013  . DDD (degenerative disc disease), lumbosacral 06/10/2012  . Depressive disorder 06/10/2012  . Diabetes mellitus without complication ([place])   . Dyspareunia 03/04/2014  . Dysthymia 06/10/2012  . Encounter for long-term (current) use of other medications 06/10/2012  . Essential hypertension 08/14/2019  . Generalized anxiety disorder 06/10/2012  . Hammer toe of right foot 10/23/2014   Formatting of this note might be different from the original. Second and third tarsals Formatting of this note might be different from the original. Formatting of this note might be different  from the original. Second and third tarsals  . High cholesterol   . Hypercholesterolemia 03/06/2013  . Hypertension   . Insomnia 06/10/2012  . Lumbosacral spondylosis 06/10/2012  . Lump of skin 10/23/2014   Formatting of this note might be different from the original. Right breast-manipulated by patient prior to clinical exam  . Migraine syndrome 06/10/2012  . Numbness of toes 10/23/2014  . Osteoarthrosis, generalized, involving multiple sites 06/10/2012  . Pain in soft tissues of limb 06/10/2012   Formatting of this note might be different from the original. 03/09/11 right arm.  . Pain in toes of both feet 10/23/2014  . Patellar tendinitis 06/10/2012  . Persistent lymphocytosis 05/25/2014  . Plantar fascial fibromatosis 06/10/2012  . Risk for falls 03/05/2015  . Sensorineural hearing loss 06/10/2012  . Shoulder impingement, right 11/27/2018  . Subacute vaginitis 07/18/2015  . Tendinopathy of rotator cuff, right 11/27/2018  . Tinnitus 03/21/2013  . Tobacco use disorder 03/06/2013  . Type 2 diabetes, controlled, with neuropathy ([place]) 10/21/2014  . Unspecified cataract 06/30/2015  . Vertigo 06/14/2013  . Vestibular dizziness 06/10/2012    Past Surgical History:  Procedure Laterality Date  . ABDOMINAL HYSTERECTOMY    . CESAREAN SECTION      Current Medications: Current Meds  Medication Sig  . albuterol (VENTOLIN HFA) 108 (90 Base) MCG/ACT inhaler Inhale 2 puffs into the lungs every 4 (four) hours as needed for shortness of breath.  Marland Kitchen aspirin 81 MG EC tablet Take 81 mg by mouth daily.  . diazepam (VALIUM)  5 MG tablet Take 1 tablet (5 mg total) by mouth every 8 (eight) hours as needed (dizziness).  . ergocalciferol (VITAMIN D2) 1.25 MG (50000 UT) capsule Take 50,000 Units by mouth every 7 (seven) days.  . Ipratropium-Albuterol (COMBIVENT) 20-100 MCG/ACT AERS respimat Inhale 1 puff into the lungs daily in the afternoon.  Marland Kitchen losartan (COZAAR) 25 MG tablet Take 1 tablet (25 mg total) by mouth in the morning and  at bedtime.  . metFORMIN (GLUCOPHAGE-XR) 500 MG 24 hr tablet Take 500 mg by mouth daily.  Marland Kitchen NARCAN 4 MG/0.1ML LIQD nasal spray kit Place 4 mg into the nose once.  . nitroGLYCERIN (NITROSTAT) 0.4 MG SL tablet Place 0.4 mg under the tongue every 5 (five) minutes as needed for chest pain.  Marland Kitchen ondansetron (ZOFRAN ODT) 8 MG disintegrating tablet Take 1 tablet (8 mg total) by mouth every 8 (eight) hours as needed for nausea or vomiting.  . OxyCODONE (OXYCONTIN) 10 mg T12A 12 hr tablet Take 10 mg by mouth every 12 (twelve) hours.  . pravastatin (PRAVACHOL) 80 MG tablet Take 80 mg by mouth daily.  . [DISCONTINUED] losartan (COZAAR) 25 MG tablet Take 25 mg by mouth daily.     Allergies:   Acetaminophen   Social History   Socioeconomic History  . Marital status: Single    Spouse name: Not on file  . Number of children: Not on file  . Years of education: Not on file  . Highest education level: Not on file  Occupational History  . Not on file  Tobacco Use  . Smoking status: Current Every Day Smoker    Packs/day: 1.00    Types: Cigarettes  . Smokeless tobacco: Never Used  Vaping Use  . Vaping Use: Never used  Substance and Sexual Activity  . Alcohol use: Yes    Comment: occasional  . Drug use: No    Comment: did eat marijuana brownie last week  . Sexual activity: Not on file  Other Topics Concern  . Not on file  Social History Narrative  . Not on file   Social Determinants of Health   Financial Resource Strain: Not on file  Food Insecurity: Not on file  Transportation Needs: Not on file  Physical Activity: Not on file  Stress: Not on file  Social Connections: Not on file     Family History: The patient's family history includes Diabetes in her maternal grandfather; Hypertension in her maternal grandmother; Stomach cancer in her mother.  ROS:   Review of Systems  Constitution: Negative for decreased appetite, fever and weight gain.  HENT: Negative for congestion, ear  discharge, hoarse voice and sore throat.   Eyes: Negative for discharge, redness, vision loss in right eye and visual halos.  Cardiovascular: Negative for chest pain, dyspnea on exertion, leg swelling, orthopnea and palpitations.  Respiratory: Negative for cough, hemoptysis, shortness of breath and snoring.   Endocrine: Negative for heat intolerance and polyphagia.  Hematologic/Lymphatic: Negative for bleeding problem. Does not bruise/bleed easily.  Skin: Negative for flushing, nail changes, rash and suspicious lesions.  Musculoskeletal: Negative for arthritis, joint pain, muscle cramps, myalgias, neck pain and stiffness.  Gastrointestinal: Negative for abdominal pain, bowel incontinence, diarrhea and excessive appetite.  Genitourinary: Negative for decreased libido, genital sores and incomplete emptying.  Neurological: Negative for brief paralysis, focal weakness, headaches and loss of balance.  Psychiatric/Behavioral: Negative for altered mental status, depression and suicidal ideas.  Allergic/Immunologic: Negative for HIV exposure and persistent infections.    EKGs/Labs/Other Studies  Reviewed:    The following studies were reviewed today:   EKG:  The ekg ordered today demonstrates    Pharmacologic nuclear stress test April 11, 2020  The left ventricular ejection fraction is mildly decreased (45-54%).  Nuclear stress EF: 48%.  Downsloping ST segment depression ST segment depression was noted during stress in the II, III, aVF and V6 leads, and returning to baseline after 5-9 minutes of recovery.  Defect 1: There is a medium defect of moderate severity present in the basal inferoseptal, mid inferoseptal and mid inferior location.  Defect 2: There is a large defect of moderate severity present in the basal anterior, mid anterior and apical anterior location.  Findings consistent with ischemia.  This is an intermediate risk study.   ECG changes with lexiscan infusion (downsloping  ST depressions). Resting images have diffuse diminished uptake in the anterior wall not seen on stress imaging, most consistent with artifact given normal wall motion in the area. There is however a defect in the basal to mid inferior/inferoseptal wall at stress, not seen at rest, consistent with ischemia.    Recent Labs: 10/28/2019: BUN 13; Creatinine, Ser 0.98; Hemoglobin 14.2; Platelets 255; Potassium 3.9; Sodium 140  Recent Lipid Panel No results found for: CHOL, TRIG, HDL, CHOLHDL, VLDL, LDLCALC, LDLDIRECT  Physical Exam:    VS:  BP (!) 154/90   Pulse 83   Ht _0  (1.626 m)   Wt 165 lb (74.8 kg)   SpO2 100%   BMI 28.32 kg/m     Wt Readings from Last 3 Encounters:  04/16/20 165 lb (74.8 kg)  04/11/20 166 lb (75.3 kg)  02/25/20 166 lb 1.3 oz (75.3 kg)     GEN: Well nourished, well developed in no acute distress HEENT: Normal NECK: No JVD; No carotid bruits LYMPHATICS: No lymphadenopathy CARDIAC: S1S2 noted,RRR, no murmurs, rubs, gallops RESPIRATORY:  Clear to auscultation without rales, wheezing or rhonchi  ABDOMEN: Soft, non-tender, non-distended, +bowel sounds, no guarding. EXTREMITIES: No edema, No cyanosis, no clubbing MUSCULOSKELETAL:  No deformity  SKIN: Warm and dry NEUROLOGIC:  Alert and oriented x 3, non-focal PSYCHIATRIC:  Normal affect, good insight  ASSESSMENT:    1. Abnormal stress test   2. Tobacco abuse   3. Hypertension, unspecified type   4. Diabetes mellitus due to underlying condition with hyperosmolarity without coma, without long-term current use of insulin ([place])   5. Mixed hyperlipidemia    PLAN:     I discussed with the patient that her stress test was abnormal showing evidence of ischemia.  I did explain to her that the next best step is that heart catheterization likely due to significant blockages.  The patient understands that risks include but are not limited to stroke (1 in 1000), death (1 in 45), kidney failure [usually temporary]  (1 in 500), bleeding (1 in 200), allergic reaction [possibly serious] (1 in 200), and agrees to proceed.  She is hypertensive in the office today, I also did take her blood pressure manually.  Her target is less than 130/80 mmHg.  At this time she is agreeable to increase her losartan to 25 mg twice a day.  She will continue on her pravastatin.  This is being managed by his primary care doctor.  No adjustments for antidiabetic medications were made today.  Smoking cessation advised  The patient is in agreement with the above plan. The patient left the office in stable condition.  The patient will follow up in 4 weeks with Dr.  Revankar   Medication Adjustments/Labs and Tests Ordered: Current medicines are reviewed at length with the patient today.  Concerns regarding medicines are outlined above.  Orders Placed This Encounter  Procedures  . Basic metabolic panel  . CBC with Differential/Platelet  . Magnesium   Meds ordered this encounter  Medications  . losartan (COZAAR) 25 MG tablet    Sig: Take 1 tablet (25 mg total) by mouth in the morning and at bedtime.    Dispense:  90 tablet    Refill:  3    Patient Instructions  Medication Instructions:  Your physician has recommended you make the following change in your medication: START: Losartan 25 mg twice daily *If you need a refill on your cardiac medications before your next appointment, please call your pharmacy*   Lab Work: Your physician recommends that you return for lab work in:  TODAY: BMET, Mag, CBC If you have labs (blood work) drawn today and your tests are completely normal, you will receive your results only by: Marland Kitchen MyChart Message (if you have MyChart) OR . A paper copy in the mail If you have any lab test that is abnormal or we need to change your treatment, we will call you to review the results.   Testing/Procedures:    [facility] [place], [place] [address] Dept: [phone] Loc: [phone]  Lenice Koper  04/16/2020  You are scheduled for a Cardiac Catheterization on Tuesday, April 19 with Dr. Shelva Majestic.  1. Please arrive at the [facility] (Main Entrance A) at [facility]: [address] [place], [place] [address] at 5:30 AM (This time is two hours before your procedure to ensure your preparation). Free valet parking service is available.   Special note: Every effort is made to have your procedure done on time. Please understand that emergencies sometimes delay scheduled procedures.  2. Diet: Do not eat solid foods after midnight.  The patient may have clear liquids until 5am upon the day of the procedure.  3. Labs: You will need to have blood drawn on  4. Medication instructions in preparation for your procedure:   Contrast Allergy: No   Do not take Diabetes Med Glucophage (Metformin) on the day of the procedure and HOLD 48 HOURS AFTER THE PROCEDURE.  On the morning of your procedure, take your Aspirin and any morning medicines NOT listed above.  You may use sips of water.  5. Plan for one night stay--bring personal belongings. 6. Bring a current list of your medications and current insurance cards. 7. You MUST have a responsible person to drive you home. 8. Someone MUST be with you the first 24 hours after you arrive home or your discharge will be delayed. 9. Please wear clothes that are easy to get on and off and wear slip-on shoes.  Thank you for allowing [place] to care for you!   -- [facility]    Follow-Up: At [facility], you and your health needs are our priority.  As part of our continuing mission to provide you with exceptional heart care, we have created designated Provider Care Teams.  These Care Teams include your primary Cardiologist (physician) and Advanced Practice Providers (APPs -  Physician Assistants  and Nurse Practitioners) who all work together to provide you with the care you need, when you need it.  We recommend signing up for the patient portal called "MyChart".  Sign up information is provided on this After Visit Summary.  MyChart is used to connect with patients for Virtual Visits (Telemedicine).  Patients are able to view lab/test results, encounter notes, upcoming appointments, etc.  Non-urgent messages can be sent to your provider as well.   To learn more about what you can do with MyChart, go to [email or web address].    Your next appointment:   4 week(s)  The format for your next appointment:   In Person  Provider:   Jyl Heinz, MD   Other Instructions      Adopting a Healthy Lifestyle.  Know what a healthy weight is for you (roughly BMI <25) and aim to maintain this   Aim for 7+ servings of fruits and vegetables daily   65-80+ fluid ounces of water or unsweet tea for healthy kidneys   Limit to max 1 drink of alcohol per day; avoid smoking/tobacco   Limit animal fats in diet for cholesterol and heart health - choose grass fed whenever available   Avoid highly processed foods, and foods high in saturated/trans fats   Aim for low stress - take time to unwind and care for your mental health   Aim for 150 min of moderate intensity exercise weekly for heart health, and weights twice weekly for bone health   Aim for 7-9 hours of sleep daily   When it comes to diets, agreement about the perfect plan isnt easy to find, even among the experts. Experts at the [employer] developed an idea known as the Healthy Eating Plate. Just imagine a plate divided into logical, healthy portions.   The emphasis is on diet quality:   Load up on vegetables and fruits - one-half of your plate: Aim for color and variety, and remember that potatoes dont count.   Go for whole grains - one-quarter of your plate: Whole wheat, barley, wheat berries, quinoa,  oats, brown rice, and foods made with them. If you want pasta, go with whole wheat pasta.   Protein power - one-quarter of your plate: Fish, chicken, beans, and nuts are all healthy, versatile protein sources. Limit red meat.   The diet, however, does go beyond the plate, offering a few other suggestions.   Use healthy plant oils, such as olive, canola, soy, corn, sunflower and peanut. Check the labels, and avoid partially hydrogenated oil, which have unhealthy trans fats.   If youre thirsty, drink water. Coffee and tea are good in moderation, but skip sugary drinks and limit milk and dairy products to one or two daily servings.   The type of carbohydrate in the diet is more important than the amount. Some sources of carbohydrates, such as vegetables, fruits, whole grains, and beans-are healthier than others.   Finally, stay active  Signed, Berniece Salines, DO  04/16/2020 11:37 AM    [place]

## 2020-04-16 NOTE — Telephone Encounter (Signed)
Patient calling for instructions for her procedure 04/22/2020.

## 2020-04-17 LAB — CBC WITH DIFFERENTIAL/PLATELET
Basophils Absolute: 0 10*3/uL (ref 0.0–0.2)
Basos: 0 %
EOS (ABSOLUTE): 0 10*3/uL (ref 0.0–0.4)
Eos: 0 %
Hematocrit: 43.1 % (ref 34.0–46.6)
Hemoglobin: 14.4 g/dL (ref 11.1–15.9)
Immature Grans (Abs): 0 10*3/uL (ref 0.0–0.1)
Immature Granulocytes: 0 %
Lymphocytes Absolute: 2.9 10*3/uL (ref 0.7–3.1)
Lymphs: 49 %
MCH: 29.8 pg (ref 26.6–33.0)
MCHC: 33.4 g/dL (ref 31.5–35.7)
MCV: 89 fL (ref 79–97)
Monocytes Absolute: 0.4 10*3/uL (ref 0.1–0.9)
Monocytes: 6 %
Neutrophils Absolute: 2.7 10*3/uL (ref 1.4–7.0)
Neutrophils: 45 %
Platelets: 235 10*3/uL (ref 150–450)
RBC: 4.84 x10E6/uL (ref 3.77–5.28)
RDW: 14.2 % (ref 11.7–15.4)
WBC: 6 10*3/uL (ref 3.4–10.8)

## 2020-04-17 LAB — BASIC METABOLIC PANEL
BUN/Creatinine Ratio: 11 — ABNORMAL LOW (ref 12–28)
BUN: 10 mg/dL (ref 8–27)
CO2: 19 mmol/L — ABNORMAL LOW (ref 20–29)
Calcium: 9.3 mg/dL (ref 8.7–10.3)
Chloride: 106 mmol/L (ref 96–106)
Creatinine, Ser: 0.87 mg/dL (ref 0.57–1.00)
Glucose: 117 mg/dL — ABNORMAL HIGH (ref 65–99)
Potassium: 4.4 mmol/L (ref 3.5–5.2)
Sodium: 142 mmol/L (ref 134–144)
eGFR: 76 mL/min/{1.73_m2} (ref >59)

## 2020-04-17 LAB — MAGNESIUM: Magnesium: 2 mg/dL (ref 1.6–2.3)

## 2020-04-18 ENCOUNTER — Other Ambulatory Visit (HOSPITAL_COMMUNITY)
Admission: RE | Admit: 2020-04-18 | Discharge: 2020-04-18 | Disposition: A | Discharge status: Home / Self Care | Payer: Medicare Other | Source: Ambulatory Visit | Attending: Cardiovascular Disease | Admitting: Cardiovascular Disease

## 2020-04-18 DIAGNOSIS — Z20822 Contact with and (suspected) exposure to covid-19: Secondary | ICD-10-CM | POA: Insufficient documentation

## 2020-04-18 DIAGNOSIS — Z01812 Encounter for preprocedural laboratory examination: Secondary | ICD-10-CM | POA: Diagnosis present

## 2020-04-18 LAB — SARS CORONAVIRUS 2 (TAT 6-24 HRS): SARS Coronavirus 2: NEGATIVE

## 2020-04-21 NOTE — Progress Notes (Signed)
Called patient to go over procedure instructions for tomorrow.  Arrival time is 0530, nothing to eat of drink after midnight.  Need responsible person to drive you home and stay overnight with you.

## 2020-04-22 ENCOUNTER — Inpatient Hospital Stay (HOSPITAL_COMMUNITY): Payer: Medicare Other

## 2020-04-22 ENCOUNTER — Other Ambulatory Visit: Payer: Self-pay

## 2020-04-22 ENCOUNTER — Inpatient Hospital Stay (HOSPITAL_COMMUNITY)
Admission: RE | Admit: 2020-04-22 | Discharge: 2020-05-01 | DRG: 233 | Disposition: A | Discharge status: Home / Self Care | Payer: Medicare Other | Attending: Cardiothoracic Surgery | Admitting: Cardiothoracic Surgery

## 2020-04-22 ENCOUNTER — Inpatient Hospital Stay (HOSPITAL_COMMUNITY)
Admission: RE | Disposition: A | Discharge status: Home / Self Care | Payer: Medicare Other | Source: Home / Self Care | Attending: Cardiothoracic Surgery

## 2020-04-22 ENCOUNTER — Encounter (HOSPITAL_COMMUNITY): Payer: Self-pay | Admitting: Cardiovascular Disease

## 2020-04-22 DIAGNOSIS — Z01810 Encounter for preprocedural cardiovascular examination: Secondary | ICD-10-CM | POA: Diagnosis not present

## 2020-04-22 DIAGNOSIS — Z7984 Long term (current) use of oral hypoglycemic drugs: Secondary | ICD-10-CM | POA: Diagnosis not present

## 2020-04-22 DIAGNOSIS — Z01811 Encounter for preprocedural respiratory examination: Secondary | ICD-10-CM

## 2020-04-22 DIAGNOSIS — J9691 Respiratory failure, unspecified with hypoxia: Secondary | ICD-10-CM | POA: Diagnosis not present

## 2020-04-22 DIAGNOSIS — Z79899 Other long term (current) drug therapy: Secondary | ICD-10-CM

## 2020-04-22 DIAGNOSIS — I25110 Atherosclerotic heart disease of native coronary artery with unstable angina pectoris: Principal | ICD-10-CM | POA: Diagnosis present

## 2020-04-22 DIAGNOSIS — Z4659 Encounter for fitting and adjustment of other gastrointestinal appliance and device: Secondary | ICD-10-CM

## 2020-04-22 DIAGNOSIS — D62 Acute posthemorrhagic anemia: Secondary | ICD-10-CM | POA: Diagnosis not present

## 2020-04-22 DIAGNOSIS — Z800 Family history of malignant neoplasm of digestive organs: Secondary | ICD-10-CM

## 2020-04-22 DIAGNOSIS — E782 Mixed hyperlipidemia: Secondary | ICD-10-CM | POA: Diagnosis present

## 2020-04-22 DIAGNOSIS — E1140 Type 2 diabetes mellitus with diabetic neuropathy, unspecified: Secondary | ICD-10-CM | POA: Diagnosis present

## 2020-04-22 DIAGNOSIS — J95821 Acute postprocedural respiratory failure: Secondary | ICD-10-CM | POA: Diagnosis not present

## 2020-04-22 DIAGNOSIS — I5033 Acute on chronic diastolic (congestive) heart failure: Secondary | ICD-10-CM | POA: Diagnosis not present

## 2020-04-22 DIAGNOSIS — Z90710 Acquired absence of both cervix and uterus: Secondary | ICD-10-CM | POA: Diagnosis not present

## 2020-04-22 DIAGNOSIS — G8929 Other chronic pain: Secondary | ICD-10-CM | POA: Diagnosis present

## 2020-04-22 DIAGNOSIS — Z8249 Family history of ischemic heart disease and other diseases of the circulatory system: Secondary | ICD-10-CM

## 2020-04-22 DIAGNOSIS — F17210 Nicotine dependence, cigarettes, uncomplicated: Secondary | ICD-10-CM | POA: Diagnosis present

## 2020-04-22 DIAGNOSIS — I110 Hypertensive heart disease with heart failure: Secondary | ICD-10-CM | POA: Diagnosis present

## 2020-04-22 DIAGNOSIS — J9811 Atelectasis: Secondary | ICD-10-CM | POA: Diagnosis not present

## 2020-04-22 DIAGNOSIS — Z951 Presence of aortocoronary bypass graft: Secondary | ICD-10-CM | POA: Diagnosis not present

## 2020-04-22 DIAGNOSIS — E119 Type 2 diabetes mellitus without complications: Secondary | ICD-10-CM | POA: Diagnosis not present

## 2020-04-22 DIAGNOSIS — Z9889 Other specified postprocedural states: Secondary | ICD-10-CM

## 2020-04-22 DIAGNOSIS — D696 Thrombocytopenia, unspecified: Secondary | ICD-10-CM | POA: Diagnosis present

## 2020-04-22 DIAGNOSIS — Z86010 Personal history of colonic polyps: Secondary | ICD-10-CM | POA: Diagnosis not present

## 2020-04-22 DIAGNOSIS — Z833 Family history of diabetes mellitus: Secondary | ICD-10-CM | POA: Diagnosis not present

## 2020-04-22 DIAGNOSIS — E876 Hypokalemia: Secondary | ICD-10-CM | POA: Diagnosis not present

## 2020-04-22 DIAGNOSIS — M549 Dorsalgia, unspecified: Secondary | ICD-10-CM | POA: Diagnosis present

## 2020-04-22 DIAGNOSIS — M159 Polyosteoarthritis, unspecified: Secondary | ICD-10-CM | POA: Diagnosis present

## 2020-04-22 DIAGNOSIS — Z4509 Encounter for adjustment and management of other cardiac device: Secondary | ICD-10-CM

## 2020-04-22 DIAGNOSIS — Z7982 Long term (current) use of aspirin: Secondary | ICD-10-CM | POA: Diagnosis not present

## 2020-04-22 DIAGNOSIS — J449 Chronic obstructive pulmonary disease, unspecified: Secondary | ICD-10-CM | POA: Diagnosis present

## 2020-04-22 DIAGNOSIS — I2510 Atherosclerotic heart disease of native coronary artery without angina pectoris: Secondary | ICD-10-CM

## 2020-04-22 HISTORY — PX: LEFT HEART CATH AND CORONARY ANGIOGRAPHY: CATH118249

## 2020-04-22 HISTORY — DX: Atherosclerotic heart disease of native coronary artery without angina pectoris: I25.10

## 2020-04-22 LAB — SURGICAL PCR SCREEN
MRSA, PCR: NEGATIVE
Staphylococcus aureus: NEGATIVE

## 2020-04-22 LAB — GLUCOSE, CAPILLARY
Glucose-Capillary: 136 mg/dL — ABNORMAL HIGH (ref 70–99)
Glucose-Capillary: 116 mg/dL — ABNORMAL HIGH (ref 70–99)

## 2020-04-22 LAB — ABO/RH: ABO/RH(D): AB POS

## 2020-04-22 LAB — ECHOCARDIOGRAM COMPLETE
Area-P 1/2: 3.77 cm2
Height: 64 in
S' Lateral: 3.5 cm
Weight: 2640 oz

## 2020-04-22 SURGERY — LEFT HEART CATH AND CORONARY ANGIOGRAPHY
Anesthesia: LOCAL

## 2020-04-22 MED ORDER — FENTANYL CITRATE (PF) 100 MCG/2ML IJ SOLN
INTRAMUSCULAR | Status: DC | PRN
  Administered 2020-04-22: 25 ug via INTRAVENOUS

## 2020-04-22 MED ORDER — SODIUM CHLORIDE 0.9% FLUSH: 3.0000 mL | INTRAVENOUS | Status: DC | PRN

## 2020-04-22 MED ORDER — NITROGLYCERIN IN D5W 200-5 MCG/ML-% IV SOLN
2.0000 ug/min | INTRAVENOUS | Status: AC
  Administered 2020-04-23: 10 ug/min via INTRAVENOUS
  Filled 2020-04-22: qty 250

## 2020-04-22 MED ORDER — SODIUM CHLORIDE 0.9 % IV SOLN: INTRAVENOUS | Status: AC

## 2020-04-22 MED ORDER — PHENYLEPHRINE HCL-NACL 20-0.9 MG/250ML-% IV SOLN
30.0000 ug/min | INTRAVENOUS | Status: AC
  Administered 2020-04-23: 25 ug/min via INTRAVENOUS
  Filled 2020-04-22: qty 250

## 2020-04-22 MED ORDER — TEMAZEPAM 15 MG PO CAPS: 15.0000 mg | ORAL_CAPSULE | Freq: Once | ORAL | Status: DC | PRN

## 2020-04-22 MED ORDER — EPINEPHRINE HCL 5 MG/250ML IV SOLN IN NS
0.0000 ug/min | INTRAVENOUS | Status: AC
  Administered 2020-04-23: 4 ug/min via INTRAVENOUS
  Filled 2020-04-22: qty 250

## 2020-04-22 MED ORDER — HEPARIN (PORCINE) IN NACL 1000-0.9 UT/500ML-% IV SOLN
INTRAVENOUS | Status: AC
  Filled 2020-04-22: qty 1500

## 2020-04-22 MED ORDER — ASPIRIN 81 MG PO CHEW: 81.0000 mg | CHEWABLE_TABLET | ORAL | Status: DC

## 2020-04-22 MED ORDER — VERAPAMIL HCL 2.5 MG/ML IV SOLN
INTRAVENOUS | Status: DC | PRN
  Administered 2020-04-22: 10 mL via INTRA_ARTERIAL

## 2020-04-22 MED ORDER — SODIUM CHLORIDE 0.9 % WEIGHT BASED INFUSION: 1.0000 mL/kg/h | INTRAVENOUS | Status: DC

## 2020-04-22 MED ORDER — CHLORHEXIDINE GLUCONATE CLOTH 2 % EX PADS
6.0000 | MEDICATED_PAD | Freq: Once | CUTANEOUS | Status: AC
  Administered 2020-04-22: 6 via TOPICAL

## 2020-04-22 MED ORDER — PLASMA-LYTE 148 IV SOLN
INTRAVENOUS | Status: DC
  Filled 2020-04-22: qty 2.5

## 2020-04-22 MED ORDER — DEXMEDETOMIDINE HCL IN NACL 400 MCG/100ML IV SOLN
0.1000 ug/kg/h | INTRAVENOUS | Status: AC
  Administered 2020-04-23: .5 ug/kg/h via INTRAVENOUS
  Filled 2020-04-22: qty 100

## 2020-04-22 MED ORDER — ATORVASTATIN CALCIUM 80 MG PO TABS
80.0000 mg | ORAL_TABLET | Freq: Every day | ORAL | Status: DC
  Administered 2020-04-22 – 2020-05-01 (×8): 80 mg via ORAL
  Filled 2020-04-22 (×3): qty 1
  Filled 2020-04-22: qty 2
  Filled 2020-04-22 (×4): qty 1

## 2020-04-22 MED ORDER — LIDOCAINE HCL (PF) 1 % IJ SOLN
INTRAMUSCULAR | Status: AC
  Filled 2020-04-22: qty 30

## 2020-04-22 MED ORDER — ONDANSETRON HCL 4 MG/2ML IJ SOLN: 4.0000 mg | Freq: Four times a day (QID) | INTRAMUSCULAR | Status: DC | PRN

## 2020-04-22 MED ORDER — SODIUM CHLORIDE 0.9 % WEIGHT BASED INFUSION
3.0000 mL/kg/h | INTRAVENOUS | Status: DC
  Administered 2020-04-22: 3 mL/kg/h via INTRAVENOUS

## 2020-04-22 MED ORDER — SODIUM CHLORIDE 0.9 % IV SOLN: 250.0000 mL | INTRAVENOUS | Status: DC | PRN

## 2020-04-22 MED ORDER — SODIUM CHLORIDE 0.9 % IV SOLN
750.0000 mg | INTRAVENOUS | Status: AC
  Administered 2020-04-23: 750 mg via INTRAVENOUS
  Filled 2020-04-22: qty 750

## 2020-04-22 MED ORDER — INSULIN REGULAR(HUMAN) IN NACL 100-0.9 UT/100ML-% IV SOLN
INTRAVENOUS | Status: AC
  Administered 2020-04-23: 2.4 [IU]/h via INTRAVENOUS
  Filled 2020-04-22: qty 100

## 2020-04-22 MED ORDER — FENTANYL CITRATE (PF) 100 MCG/2ML IJ SOLN
INTRAMUSCULAR | Status: AC
  Filled 2020-04-22: qty 2

## 2020-04-22 MED ORDER — SODIUM CHLORIDE 0.9% FLUSH: 3.0000 mL | Freq: Two times a day (BID) | INTRAVENOUS | Status: DC

## 2020-04-22 MED ORDER — SODIUM CHLORIDE 0.9 % IV SOLN
INTRAVENOUS | Status: DC
  Filled 2020-04-22: qty 30

## 2020-04-22 MED ORDER — MAGNESIUM SULFATE 50 % IJ SOLN
40.0000 meq | INTRAMUSCULAR | Status: DC
  Filled 2020-04-22: qty 9.85

## 2020-04-22 MED ORDER — MIDAZOLAM HCL 2 MG/2ML IJ SOLN
INTRAMUSCULAR | Status: DC | PRN
  Administered 2020-04-22: 2 mg via INTRAVENOUS

## 2020-04-22 MED ORDER — ISOSORBIDE MONONITRATE ER 30 MG PO TB24
30.0000 mg | ORAL_TABLET | Freq: Every day | ORAL | Status: DC
  Administered 2020-04-22: 30 mg via ORAL
  Filled 2020-04-22: qty 1

## 2020-04-22 MED ORDER — TRANEXAMIC ACID (OHS) BOLUS VIA INFUSION
15.0000 mg/kg | INTRAVENOUS | Status: AC
  Administered 2020-04-23: 1122 mg via INTRAVENOUS
  Filled 2020-04-22: qty 1122

## 2020-04-22 MED ORDER — POTASSIUM CHLORIDE 2 MEQ/ML IV SOLN
80.0000 meq | INTRAVENOUS | Status: DC
  Filled 2020-04-22: qty 40

## 2020-04-22 MED ORDER — METOPROLOL TARTRATE 25 MG PO TABS
25.0000 mg | ORAL_TABLET | Freq: Two times a day (BID) | ORAL | Status: DC
Start: 2020-04-22 — End: 2020-04-23
  Administered 2020-04-22 (×2): 25 mg via ORAL
  Filled 2020-04-22 (×2): qty 1

## 2020-04-22 MED ORDER — LIDOCAINE HCL (PF) 1 % IJ SOLN
INTRAMUSCULAR | Status: DC | PRN
  Administered 2020-04-22: 2 mL

## 2020-04-22 MED ORDER — CHLORHEXIDINE GLUCONATE CLOTH 2 % EX PADS
6.0000 | MEDICATED_PAD | Freq: Once | CUTANEOUS | Status: AC
  Administered 2020-04-23: 6 via TOPICAL

## 2020-04-22 MED ORDER — TRANEXAMIC ACID 1000 MG/10ML IV SOLN
1.5000 mg/kg/h | INTRAVENOUS | Status: AC
  Administered 2020-04-23: 1.5 mg/kg/h via INTRAVENOUS
  Filled 2020-04-22: qty 25

## 2020-04-22 MED ORDER — HEPARIN SODIUM (PORCINE) 1000 UNIT/ML IJ SOLN
INTRAMUSCULAR | Status: DC | PRN
  Administered 2020-04-22: 3700 [IU] via INTRAVENOUS

## 2020-04-22 MED ORDER — VANCOMYCIN HCL 1250 MG/250ML IV SOLN
1250.0000 mg | INTRAVENOUS | Status: AC
  Administered 2020-04-23: 1250 mg via INTRAVENOUS
  Filled 2020-04-22: qty 250

## 2020-04-22 MED ORDER — HYDRALAZINE HCL 20 MG/ML IJ SOLN: 10.0000 mg | INTRAMUSCULAR | Status: AC | PRN

## 2020-04-22 MED ORDER — HEPARIN SODIUM (PORCINE) 1000 UNIT/ML IJ SOLN
INTRAMUSCULAR | Status: AC
  Filled 2020-04-22: qty 1

## 2020-04-22 MED ORDER — NOREPINEPHRINE 4 MG/250ML-% IV SOLN
0.0000 ug/min | INTRAVENOUS | Status: DC
  Filled 2020-04-22: qty 250

## 2020-04-22 MED ORDER — DIAZEPAM 5 MG PO TABS: 5.0000 mg | ORAL_TABLET | ORAL | Status: DC | PRN

## 2020-04-22 MED ORDER — CHLORHEXIDINE GLUCONATE 0.12 % MT SOLN
15.0000 mL | Freq: Once | OROMUCOSAL | Status: AC
  Administered 2020-04-23: 15 mL via OROMUCOSAL
  Filled 2020-04-22: qty 15

## 2020-04-22 MED ORDER — HEPARIN (PORCINE) IN NACL 1000-0.9 UT/500ML-% IV SOLN
INTRAVENOUS | Status: DC | PRN
  Administered 2020-04-22 (×2): 500 mL

## 2020-04-22 MED ORDER — ACETAMINOPHEN 325 MG PO TABS: 650.0000 mg | ORAL_TABLET | ORAL | Status: DC | PRN

## 2020-04-22 MED ORDER — VERAPAMIL HCL 2.5 MG/ML IV SOLN
INTRAVENOUS | Status: AC
  Filled 2020-04-22: qty 2

## 2020-04-22 MED ORDER — METOPROLOL TARTRATE 12.5 MG HALF TABLET
12.5000 mg | ORAL_TABLET | Freq: Once | ORAL | Status: AC
  Administered 2020-04-23: 12.5 mg via ORAL
  Filled 2020-04-22: qty 1

## 2020-04-22 MED ORDER — ASPIRIN 81 MG PO CHEW
81.0000 mg | CHEWABLE_TABLET | Freq: Every day | ORAL | Status: DC
  Administered 2020-04-22: 81 mg via ORAL
  Filled 2020-04-22: qty 1

## 2020-04-22 MED ORDER — LABETALOL HCL 5 MG/ML IV SOLN: 10.0000 mg | INTRAVENOUS | Status: AC | PRN

## 2020-04-22 MED ORDER — TRANEXAMIC ACID (OHS) PUMP PRIME SOLUTION
2.0000 mg/kg | INTRAVENOUS | Status: DC
  Filled 2020-04-22: qty 1.5

## 2020-04-22 MED ORDER — SODIUM CHLORIDE 0.9% FLUSH
3.0000 mL | Freq: Two times a day (BID) | INTRAVENOUS | Status: DC
  Administered 2020-04-22: 3 mL via INTRAVENOUS

## 2020-04-22 MED ORDER — SODIUM CHLORIDE 0.9 % IV SOLN
1.5000 g | INTRAVENOUS | Status: AC
  Administered 2020-04-23: 1.5 g via INTRAVENOUS
  Filled 2020-04-22 (×2): qty 1.5

## 2020-04-22 MED ORDER — MILRINONE LACTATE IN DEXTROSE 20-5 MG/100ML-% IV SOLN
0.3000 ug/kg/min | INTRAVENOUS | Status: AC
  Administered 2020-04-23: .25 ug/kg/min via INTRAVENOUS
  Filled 2020-04-22: qty 100

## 2020-04-22 MED ORDER — BISACODYL 5 MG PO TBEC
5.0000 mg | DELAYED_RELEASE_TABLET | Freq: Once | ORAL | Status: AC
  Administered 2020-04-22: 5 mg via ORAL
  Filled 2020-04-22: qty 1

## 2020-04-22 MED ORDER — MIDAZOLAM HCL 2 MG/2ML IJ SOLN
INTRAMUSCULAR | Status: AC
  Filled 2020-04-22: qty 2

## 2020-04-22 MED ORDER — IOHEXOL 350 MG/ML SOLN
INTRAVENOUS | Status: DC | PRN
  Administered 2020-04-22: 60 mL via INTRA_ARTERIAL

## 2020-04-22 SURGICAL SUPPLY — 11 items
CATH INFINITI JR4 5F (CATHETERS) ×2 IMPLANT
CATH OPTITORQUE TIG 4.0 5F (CATHETERS) ×2 IMPLANT
DEVICE RAD COMP TR BAND LRG (VASCULAR PRODUCTS) ×2 IMPLANT
GLIDESHEATH SLEND SS 6F .021 (SHEATH) ×2 IMPLANT
GUIDEWIRE INQWIRE 1.5J.035X260 (WIRE) ×1 IMPLANT
INQWIRE 1.5J .035X260CM (WIRE) ×2
KIT HEART LEFT (KITS) ×2 IMPLANT
PACK CARDIAC CATHETERIZATION (CUSTOM PROCEDURE TRAY) ×2 IMPLANT
SHEATH PROBE COVER 6X72 (BAG) ×2 IMPLANT
TRANSDUCER W/STOPCOCK (MISCELLANEOUS) ×2 IMPLANT
TUBING CIL FLEX 10 FLL-RA (TUBING) ×2 IMPLANT

## 2020-04-22 NOTE — Research (Signed)
Identify Informed Consent   Subject Name: Sabrina Swanson  Subject met inclusion and exclusion criteria.  The informed consent form, study requirements and expectations were reviewed with the subject and questions and concerns were addressed prior to the signing of the consent form.  The subject verbalized understanding of the trial requirements.  The subject agreed to participate in the Identify trial and signed the informed consent at 0655 on 04/22/20.  The informed consent was obtained prior to performance of any protocol-specific procedures for the subject.  A copy of the signed informed consent was given to the subject and a copy was placed in the subject's medical record.   Shawndell Varas

## 2020-04-22 NOTE — Progress Notes (Signed)
  Echocardiogram 2D Echocardiogram with strain and 3D has been performed.  Darlina Sicilian M 04/22/2020, 11:27 AM

## 2020-04-22 NOTE — Interval H&P Note (Signed)
Cath Lab Visit (complete for each Cath Lab visit)  Clinical Evaluation Leading to the Procedure:   ACS: No.  Non-ACS:    Anginal Classification: CCS II  Anti-ischemic medical therapy: No Therapy  Non-Invasive Test Results: Intermediate-risk stress test findings: cardiac mortality 1-3%/year  Prior CABG: No previous CABG      History and Physical Interval Note:  04/22/2020 7:33 AM  Sabrina Swanson  has presented today for surgery, with the diagnosis of abnormal stress - hp.  The various methods of treatment have been discussed with the patient and family. After consideration of risks, benefits and other options for treatment, the patient has consented to  Procedure(s): LEFT HEART CATH AND CORONARY ANGIOGRAPHY (N/A) as a surgical intervention.  The patient's history has been reviewed, patient examined, no change in status, stable for surgery.  I have reviewed the patient's chart and labs.  Questions were answered to the patient's satisfaction.     Shelva Majestic

## 2020-04-22 NOTE — Consult Note (Addendum)
[address]       [place] [address]             [phone]        [facility] Medical Record #[id number] Date of Birth: [birth year]  Referring: Dr. Claiborne Billings, MD Primary Care: Wynelle Fanny, DO Primary Cardiologist: Dr. Geraldo Pitter  Chief Complaint: Abnormal stress test Reason for consultation: Coronary artery disease  History of Present Illness:     This is a 60 year old female with a lengthy past medical history, which includes the following cardiac risk factors essential hypertension, COPD, tobacco abuse, diabetes mellitus, hyperlipidemia who had chest pain for about 3 days earlier this month. She denies shortness of breath, nausea, diaphoresis, syncope. At family's urging, she presented to [place]. Ultimately, she was discharged and follow up was arranged with Dr. Geraldo Pitter. A nuclear stress test was arranged. She was seen by Dr. Harriet Masson on 04/13 to discuss the results of her nuclear stress test, as Dr. Geraldo Pitter was unavailable (on vacation). Test result revealed evidence of ischemia. A cardiac catheterization was then arranged and this was done by Dr. Claiborne Billings today. Results showed severe multivessel coronary artery disease (please see specific report below). Last echo was done in December 2021 so will order another. Dr. Orvan Seen was consulted for consideration of coronary artery disease. At the time of my exam, patient denied chest pain or shortness of breath. Her sister is at her bedside.   Current Activity/ Functional Status: Patient is independent with mobility/ambulation, transfers, ADL's, IADL's.   Zubrod Score: At the time of surgery this patient's most appropriate activity status/level should be described as: _0     0    Normal activity, no symptoms _1     1    Restricted in physical strenuous activity but ambulatory, able to do out light work _2     2    Ambulatory and capable of self care, unable to do work activities, up and about more than  50%  Of the time                            _3     3    Only limited self care, in bed greater than 50% of waking hours _4     4    Completely disabled, no self care, confined to bed or chair _5     5    Moribund  Past Medical History:  Diagnosis Date  . Acquired trigger finger 06/10/2012  . Alopecia 05/16/2013  . Angina pectoris ([place]) 11/13/2019  . Anxiety state 06/10/2012  . Arthritis of right acromioclavicular joint 11/27/2018  . Back pain   . Benign neoplasm of colon 06/10/2012  . Bursitis disorder 05/11/2011  . Cardiac murmur 11/13/2019  . Carpal tunnel syndrome 06/10/2012   Formatting of this note might be different from the original. bilateral  . Chalazion right upper eyelid 06/30/2015  . Continuous dependence on cigarette smoking 11/13/2019  . COPD (chronic obstructive pulmonary disease) ([place]) 05/16/2013  . DDD (degenerative disc disease), lumbosacral 06/10/2012  . Depressive disorder 06/10/2012  . Diabetes mellitus without complication ([place])   . Dyspareunia 03/04/2014  . Dysthymia 06/10/2012  . Encounter for long-term (current) use of other medications 06/10/2012  . Essential hypertension 08/14/2019  . Generalized anxiety disorder 06/10/2012  . Hammer toe of right foot 10/23/2014   Formatting of this note might be different from the original. Second and third  tarsals Formatting of this note might be different from the original. Formatting of this note might be different from the original. Second and third tarsals  . High cholesterol   . Hypercholesterolemia 03/06/2013  . Hypertension   . Insomnia 06/10/2012  . Lumbosacral spondylosis 06/10/2012  . Lump of skin 10/23/2014   Formatting of this note might be different from the original. Right breast-manipulated by patient prior to clinical exam  . Migraine syndrome 06/10/2012  . Numbness of toes 10/23/2014  . Osteoarthrosis, generalized, involving multiple sites 06/10/2012  . Pain in soft tissues of limb 06/10/2012   Formatting of this note might be different  from the original. 03/09/11 right arm.  . Pain in toes of both feet 10/23/2014  . Patellar tendinitis 06/10/2012  . Persistent lymphocytosis 05/25/2014  . Plantar fascial fibromatosis 06/10/2012  . Risk for falls 03/05/2015  . Sensorineural hearing loss 06/10/2012  . Shoulder impingement, right 11/27/2018  . Subacute vaginitis 07/18/2015  . Tendinopathy of rotator cuff, right 11/27/2018  . Tinnitus 03/21/2013  . Tobacco use disorder 03/06/2013  . Type 2 diabetes, controlled, with neuropathy ([place]) 10/21/2014  . Unspecified cataract 06/30/2015  . Vertigo 06/14/2013  . Vestibular dizziness 06/10/2012    Past Surgical History:  Procedure Laterality Date  . ABDOMINAL HYSTERECTOMY    . CESAREAN SECTION      Social History   Tobacco Use  Smoking Status Current Every Day Smoker  . Packs/day: 1.00  . Types: Cigarettes  Smokeless Tobacco Never Used    Social History   Substance and Sexual Activity  Alcohol Use Yes   Comment: occasional  Patient is on disability. She has chronic low back pain that radiates down into her right leg.  Patient has a fiance who was just diagnosed with prostate cancer and is scheduled for surgery at the beginning of May   Allergies  Allergen Reactions  . Acetaminophen. Of note, she is NOT allergic to acetaminophen but instructed not to take it as she takes scheduled Oxycontin Itching and Nausea Only    Current Facility-Administered Medications  Medication Dose Route Frequency Provider Last Rate Last Admin  . 0.9 %  sodium chloride infusion  250 mL Intravenous PRN Troy Sine, MD      . 0.9% sodium chloride infusion  1 mL/kg/hr Intravenous Continuous Troy Sine, MD 74.8 mL/hr at 04/22/20 0656 1 mL/kg/hr at 04/22/20 0656  . sodium chloride flush (NS) 0.9 % injection 3 mL  3 mL Intravenous Q12H Shelva Majestic A, MD      . sodium chloride flush (NS) 0.9 % injection 3 mL  3 mL Intravenous PRN Troy Sine, MD        Medications Prior to Admission  Medication  Sig Dispense Refill Last Dose  . albuterol (VENTOLIN HFA) 108 (90 Base) MCG/ACT inhaler Inhale 2 puffs into the lungs every 4 (four) hours as needed for shortness of breath.   Past Month at Unknown time  . aspirin 81 MG EC tablet Take 81 mg by mouth daily.   04/22/2020 at 0540  . ergocalciferol (VITAMIN D2) 1.25 MG (50000 UT) capsule Take 50,000 Units by mouth every 7 (seven) days.   Past Week at Unknown time  . Ipratropium-Albuterol (COMBIVENT) 20-100 MCG/ACT AERS respimat Inhale 1 puff into the lungs daily in the afternoon.   Past Month at Unknown time  . losartan (COZAAR) 25 MG tablet Take 1 tablet (25 mg total) by mouth in the morning and at bedtime. 90 tablet 3  04/21/2020 at Unknown time  . metFORMIN (GLUCOPHAGE-XR) 500 MG 24 hr tablet Take 500 mg by mouth daily.   04/21/2020 at Unknown time  . NARCAN 4 MG/0.1ML LIQD nasal spray kit Place 4 mg into the nose once.     . nitroGLYCERIN (NITROSTAT) 0.4 MG SL tablet Place 0.4 mg under the tongue every 5 (five) minutes as needed for chest pain.     . Omega-3 Fatty Acids (FISH OIL) 1000 MG CAPS Take 1,000 mg by mouth daily.   04/21/2020 at Unknown time  . OxyCODONE (OXYCONTIN) 10 mg T12A 12 hr tablet Take 10 mg by mouth every 12 (twelve) hours as needed (pain).   Past Week at Unknown time  . vitamin B-12 (CYANOCOBALAMIN) 1000 MCG tablet Take 1,000 mcg by mouth daily.   04/21/2020 at Unknown time    Family History  Problem Relation Age of Onset  . Stomach cancer Mother   . Hypertension Maternal Grandmother   . Diabetes Maternal Grandfather    Review of Systems:     Cardiac Review of Systems: Y or  [ N   ]= no  Chest Pain [ Prior to admission  ]  Resting SOB [ N  ] Exertional SOB  [ N ]  Orthopnea [  N]   Pedal Edema [ N  ]    Syncope  [ N ]  Presyncope [ N  ]  General Review of Systems: [Y] = yes [ N ]=no Constitional: anorexia Aqua.Slicker  ];  nausea Aqua.Slicker  ]; night sweats Aqua.Slicker  ]; fever [  N]; or chills [  N]                                                                Dental: Last Dentist visit: It has been awhile because of COVID  Eye : Amaurosis fugax[N  ]; Resp: cough [ N ];  wheezing[N  ];  hemoptysis[N  ];  GI: vomiting[N  ];  dysphagia[N  ]; melena[ N ];  hematochezia [ N ];  GU: hematuria[ N ];                Skin: rash, swelling[N ];, itching_0 ; Musculosketetal:  joint pain[ Y ];  back pain[ Y ];  Heme/Lymph:  bleeding[ N ];  anemia[ N ];  Neuro: Sharlene.Ates  ];   stroke[ N ];  vertigo[N  ];  seizures[N  ];   Psych: anxiety[ Y ];  Endocrine: diabetes[Y  ];  thyroid dysfunction[N  ];            She has had COVID vaccine and booster      Physical Exam: BP (!) 147/77   Pulse 70   Temp 97.8 F (36.6 C) (Oral)   Resp (!) 21   Ht _1  (1.626 m)   Wt 74.8 kg   SpO2 100%   BMI 28.32 kg/m    General appearance: alert, cooperative and no distress Head: Normocephalic, without obvious abnormality, atraumatic Neck: no carotid bruit, no JVD and supple, symmetrical, trachea midline Resp: clear to auscultation bilaterally Cardio: RRR, no murmur GI: Soft, non tender, bowel sounds present Extremities: No LE edema. Palpable DP/PT bilaterally Neurologic: Grossly normal  Diagnostic Studies & Laboratory data:  LEFT HEART CATH AND CORONARY ANGIOGRAPHY  by Dr. Claiborne Billings on 04/19/2022Colon Flattery LAD to Prox LAD lesion is 60% stenosed.  Mid LAD lesion is 50% stenosed.  Ramus Intermedius  Ramus-1 lesion is 50% stenosed.  Ramus-2 lesion is 70% stenosed.  Left Circumflex  Prox Cx to Mid Cx lesion is 95% stenosed.  Mid Cx to Dist Cx lesion is 50% stenosed.  Dist Cx lesion is 80% stenosed with 80% stenosed side branch in 3rd Mrg.  First Obtuse Marginal Branch  Vessel is small in size.  Right Coronary Artery  Vessel is small.  Collaterals  Mid RCA filled by collaterals from 1st RPL.    Prox RCA-1 lesion is 80% stenosed.  Prox RCA-2 lesion is 95% stenosed.  Prox RCA-3 lesion is 95% stenosed.  Mid RCA lesion is 90% stenosed.  Dist RCA lesion is 80%  stenosed.  Right Posterior Descending Artery  Collaterals  RPDA filled by collaterals from Dist LAD.    RPDA lesion is 95% stenosed.  First Right Posterolateral Branch  Vessel is small in size.     Coronary Diagrams   Diagnostic Dominance: Right    Intervention       Recent Radiology Findings:   No results found.   I have independently reviewed the above radiologic studies and discussed with the patient   Recent Lab Findings: Lab Results  Component Value Date   WBC 6.0 04/16/2020   HGB 14.4 04/16/2020   HCT 43.1 04/16/2020   PLT 235 04/16/2020   GLUCOSE 117 (H) 04/16/2020   ALT 23 11/25/2012   AST 24 11/25/2012   NA 142 04/16/2020   K 4.4 04/16/2020   CL 106 04/16/2020   CREATININE 0.87 04/16/2020   BUN 10 04/16/2020   CO2 19 (L) 04/16/2020   Assessment / Plan:   1. Coronary artery disease-would appear to benefit from coronary artery bypass grafting surgery. Will obtain echo to ensure no valvular abnormality and PFTs as history of COPD/tobacco abuse. Patient is right hand dominant (cath'd via right radial artery) so if considering radial artery conduit, would recommend left radial artery. Dr. Orvan Seen to evaluate and provide his recommendation 2. History of hypertension-on Losartan 25 mg daily prior to surgery 3. History of hyperlipidemia-on Pravastatin prior to admission but now on Atorvastatin 80 mg at hs 4. History of COPD-on scheduled Combivent and Albuterol PRN 5. History of diabetes mellitus-On Metformin prior to admission. [facility] July 2021 was 7. Will recheck. 6. History of tobacco abuse-encouraged cessation   I  spent 25 minutes counseling the patient face to face.   Lars Pinks PA-C 04/22/2020 9:04 AM   Pt seen and examined; agree with PA documentation. Agree with recommendation for CABG as best therapy for diffuse, multivessel and severe CAD with preserved EF. She understands and wishes to proceed with surgery tomorrow. Mihailo Sage Z. Orvan Seen,  [place]

## 2020-04-22 NOTE — Progress Notes (Signed)
CARDIAC REHAB PHASE I   Preop education completed with pt and family. Pt given IS, able to demonstrate ~1000. Reviewed importance of IS use, walks, and sternal precautions postop. Pt given in-the-tube sheet along with [facility] care guide and Cardiac Surgery booklet. Pt states good family support. Will continue to follow throughout hospital stay.  [phone] Rufina Falco, RN BSN 04/22/2020 1:48 PM

## 2020-04-22 NOTE — Anesthesia Preprocedure Evaluation (Addendum)
Anesthesia Evaluation  Patient identified by MRN, date of birth, ID band Patient awake    Reviewed: Allergy & Precautions, NPO status , Patient's Chart, lab work & pertinent test results  History of Anesthesia Complications Negative for: history of anesthetic complications  Airway Mallampati: II  TM Distance: >3 FB Neck ROM: Full    Dental  (+) Dental Advisory Given   Pulmonary COPD, Current Smoker and Patient abstained from smoking.,  04/18/2020 SARS coronavirus NEG   breath sounds clear to auscultation       Cardiovascular hypertension, Pt. on medications + angina + CAD (severe 3v ASCAD, normal LVF)   Rhythm:Regular Rate:Normal  04/22/2020 ECHO: EF 55-60%, mild inf/inferolat hypokinesis, borderline LVH, grade II DD, no significant valvular abnormalities   Neuro/Psych  Headaches, Anxiety Depression Chronic back pain: narcotics    GI/Hepatic negative GI ROS, Neg liver ROS,   Endo/Other  diabetes (glu 118), Oral Hypoglycemic Agents  Renal/GU negative Renal ROS     Musculoskeletal  (+) Arthritis ,   Abdominal   Peds  Hematology negative hematology ROS (+)   Anesthesia Other Findings   Reproductive/Obstetrics                            Anesthesia Physical Anesthesia Plan  ASA: III  Anesthesia Plan: General   Post-op Pain Management:    Induction: Intravenous  PONV Risk Score and Plan: 2 and Treatment may vary due to age or medical condition  Airway Management Planned: Oral ETT  Additional Equipment: Arterial line, PA Cath, Ultrasound Guidance Line Placement and TEE  Intra-op Plan:   Post-operative Plan: Post-operative intubation/ventilation  Informed Consent: I have reviewed the patients History and Physical, chart, labs and discussed the procedure including the risks, benefits and alternatives for the proposed anesthesia with the patient or authorized representative who has  indicated his/her understanding and acceptance.     Dental advisory given  Plan Discussed with: CRNA and Surgeon  Anesthesia Plan Comments:        Anesthesia Quick Evaluation

## 2020-04-22 NOTE — Progress Notes (Signed)
Pre-CABG testing has been completed. Preliminary results can be found in CV Proc through chart review.   04/22/20 1:33 PM Sabrina Swanson RVT

## 2020-04-23 ENCOUNTER — Inpatient Hospital Stay (HOSPITAL_COMMUNITY): Payer: Medicare Other

## 2020-04-23 ENCOUNTER — Encounter (HOSPITAL_COMMUNITY): Payer: Self-pay | Admitting: Cardiovascular Disease

## 2020-04-23 ENCOUNTER — Inpatient Hospital Stay (HOSPITAL_COMMUNITY): Payer: Medicare Other | Admitting: Certified Registered Nurse Anesthetist

## 2020-04-23 ENCOUNTER — Inpatient Hospital Stay (HOSPITAL_COMMUNITY)
Admission: RE | Disposition: A | Discharge status: Home / Self Care | Payer: Self-pay | Source: Home / Self Care | Attending: Cardiothoracic Surgery

## 2020-04-23 DIAGNOSIS — Z951 Presence of aortocoronary bypass graft: Secondary | ICD-10-CM

## 2020-04-23 HISTORY — PX: CORONARY ARTERY BYPASS GRAFT: SHX141

## 2020-04-23 HISTORY — PX: TEE WITHOUT CARDIOVERSION: SHX5443

## 2020-04-23 HISTORY — DX: Presence of aortocoronary bypass graft: Z95.1

## 2020-04-23 LAB — POCT I-STAT 7, (LYTES, BLD GAS, ICA,H+H)
Acid-Base Excess: 1 mmol/L (ref 0.0–2.0)
Acid-Base Excess: 1 mmol/L (ref 0.0–2.0)
Acid-Base Excess: 3 mmol/L — ABNORMAL HIGH (ref 0.0–2.0)
Acid-base deficit: 1 mmol/L (ref 0.0–2.0)
Acid-base deficit: 1 mmol/L (ref 0.0–2.0)
Acid-base deficit: 9 mmol/L — ABNORMAL HIGH (ref 0.0–2.0)
Acid-base deficit: 12 mmol/L — ABNORMAL HIGH (ref 0.0–2.0)
Acid-base deficit: 2 mmol/L (ref 0.0–2.0)
Acid-base deficit: 3 mmol/L — ABNORMAL HIGH (ref 0.0–2.0)
Acid-base deficit: 3 mmol/L — ABNORMAL HIGH (ref 0.0–2.0)
Acid-base deficit: 3 mmol/L — ABNORMAL HIGH (ref 0.0–2.0)
Acid-base deficit: 1 mmol/L (ref 0.0–2.0)
Bicarbonate: 25.9 mmol/L (ref 20.0–28.0)
Bicarbonate: 26.8 mmol/L (ref 20.0–28.0)
Bicarbonate: 27.1 mmol/L (ref 20.0–28.0)
Bicarbonate: 17.7 mmol/L — ABNORMAL LOW (ref 20.0–28.0)
Bicarbonate: 23.9 mmol/L (ref 20.0–28.0)
Bicarbonate: 27.2 mmol/L (ref 20.0–28.0)
Bicarbonate: 16.2 mmol/L — ABNORMAL LOW (ref 20.0–28.0)
Bicarbonate: 24.4 mmol/L (ref 20.0–28.0)
Bicarbonate: 25.1 mmol/L (ref 20.0–28.0)
Bicarbonate: 24.4 mmol/L (ref 20.0–28.0)
Bicarbonate: 24 mmol/L (ref 20.0–28.0)
Bicarbonate: 25.5 mmol/L (ref 20.0–28.0)
Calcium, Ion: 1.26 mmol/L (ref 1.15–1.40)
Calcium, Ion: 0.94 mmol/L — ABNORMAL LOW (ref 1.15–1.40)
Calcium, Ion: 1.03 mmol/L — ABNORMAL LOW (ref 1.15–1.40)
Calcium, Ion: 0.98 mmol/L — ABNORMAL LOW (ref 1.15–1.40)
Calcium, Ion: 0.99 mmol/L — ABNORMAL LOW (ref 1.15–1.40)
Calcium, Ion: 1.43 mmol/L — ABNORMAL HIGH (ref 1.15–1.40)
Calcium, Ion: 1.1 mmol/L — ABNORMAL LOW (ref 1.15–1.40)
Calcium, Ion: 1.07 mmol/L — ABNORMAL LOW (ref 1.15–1.40)
Calcium, Ion: 1.15 mmol/L (ref 1.15–1.40)
Calcium, Ion: 1.14 mmol/L — ABNORMAL LOW (ref 1.15–1.40)
Calcium, Ion: 1.14 mmol/L — ABNORMAL LOW (ref 1.15–1.40)
Calcium, Ion: 1.1 mmol/L — ABNORMAL LOW (ref 1.15–1.40)
HCT: 39 % (ref 36.0–46.0)
HCT: 27 % — ABNORMAL LOW (ref 36.0–46.0)
HCT: 29 % — ABNORMAL LOW (ref 36.0–46.0)
HCT: 26 % — ABNORMAL LOW (ref 36.0–46.0)
HCT: 26 % — ABNORMAL LOW (ref 36.0–46.0)
HCT: 26 % — ABNORMAL LOW (ref 36.0–46.0)
HCT: 18 % — ABNORMAL LOW (ref 36.0–46.0)
HCT: 24 % — ABNORMAL LOW (ref 36.0–46.0)
HCT: 35 % — ABNORMAL LOW (ref 36.0–46.0)
HCT: 34 % — ABNORMAL LOW (ref 36.0–46.0)
HCT: 33 % — ABNORMAL LOW (ref 36.0–46.0)
HCT: 32 % — ABNORMAL LOW (ref 36.0–46.0)
Hemoglobin: 13.3 g/dL (ref 12.0–15.0)
Hemoglobin: 9.2 g/dL — ABNORMAL LOW (ref 12.0–15.0)
Hemoglobin: 9.9 g/dL — ABNORMAL LOW (ref 12.0–15.0)
Hemoglobin: 8.8 g/dL — ABNORMAL LOW (ref 12.0–15.0)
Hemoglobin: 8.8 g/dL — ABNORMAL LOW (ref 12.0–15.0)
Hemoglobin: 8.8 g/dL — ABNORMAL LOW (ref 12.0–15.0)
Hemoglobin: 6.1 g/dL — CL (ref 12.0–15.0)
Hemoglobin: 8.2 g/dL — ABNORMAL LOW (ref 12.0–15.0)
Hemoglobin: 11.9 g/dL — ABNORMAL LOW (ref 12.0–15.0)
Hemoglobin: 11.6 g/dL — ABNORMAL LOW (ref 12.0–15.0)
Hemoglobin: 11.2 g/dL — ABNORMAL LOW (ref 12.0–15.0)
Hemoglobin: 10.9 g/dL — ABNORMAL LOW (ref 12.0–15.0)
O2 Saturation: 100 %
O2 Saturation: 100 %
O2 Saturation: 83 %
O2 Saturation: 100 %
O2 Saturation: 100 %
O2 Saturation: 100 %
O2 Saturation: 100 %
O2 Saturation: 100 %
O2 Saturation: 97 %
O2 Saturation: 97 %
O2 Saturation: 95 %
O2 Saturation: 95 %
Patient temperature: 37.4
Patient temperature: 36.8
Patient temperature: 36.5
Patient temperature: 36.4
Potassium: 4.6 mmol/L (ref 3.5–5.1)
Potassium: 4.8 mmol/L (ref 3.5–5.1)
Potassium: 5 mmol/L (ref 3.5–5.1)
Potassium: 4.8 mmol/L (ref 3.5–5.1)
Potassium: 6.7 mmol/L (ref 3.5–5.1)
Potassium: 7 mmol/L (ref 3.5–5.1)
Potassium: 4.8 mmol/L (ref 3.5–5.1)
Potassium: 4.4 mmol/L (ref 3.5–5.1)
Potassium: 4.7 mmol/L (ref 3.5–5.1)
Potassium: 4.7 mmol/L (ref 3.5–5.1)
Potassium: 4.7 mmol/L (ref 3.5–5.1)
Potassium: 4.6 mmol/L (ref 3.5–5.1)
Sodium: 141 mmol/L (ref 135–145)
Sodium: 142 mmol/L (ref 135–145)
Sodium: 143 mmol/L (ref 135–145)
Sodium: 136 mmol/L (ref 135–145)
Sodium: 137 mmol/L (ref 135–145)
Sodium: 138 mmol/L (ref 135–145)
Sodium: 133 mmol/L — ABNORMAL LOW (ref 135–145)
Sodium: 141 mmol/L (ref 135–145)
Sodium: 142 mmol/L (ref 135–145)
Sodium: 144 mmol/L (ref 135–145)
Sodium: 144 mmol/L (ref 135–145)
Sodium: 143 mmol/L (ref 135–145)
TCO2: 27 mmol/L (ref 22–32)
TCO2: 28 mmol/L (ref 22–32)
TCO2: 29 mmol/L (ref 22–32)
TCO2: 19 mmol/L — ABNORMAL LOW (ref 22–32)
TCO2: 25 mmol/L (ref 22–32)
TCO2: 28 mmol/L (ref 22–32)
TCO2: 18 mmol/L — ABNORMAL LOW (ref 22–32)
TCO2: 26 mmol/L (ref 22–32)
TCO2: 27 mmol/L (ref 22–32)
TCO2: 26 mmol/L (ref 22–32)
TCO2: 25 mmol/L (ref 22–32)
TCO2: 27 mmol/L (ref 22–32)
pCO2 arterial: 53.3 mmHg — ABNORMAL HIGH (ref 32.0–48.0)
pCO2 arterial: 45.1 mmHg (ref 32.0–48.0)
pCO2 arterial: 47.1 mmHg (ref 32.0–48.0)
pCO2 arterial: 38.7 mmHg (ref 32.0–48.0)
pCO2 arterial: 40.1 mmHg (ref 32.0–48.0)
pCO2 arterial: 40.9 mmHg (ref 32.0–48.0)
pCO2 arterial: 48.9 mmHg — ABNORMAL HIGH (ref 32.0–48.0)
pCO2 arterial: 47.1 mmHg (ref 32.0–48.0)
pCO2 arterial: 59.1 mmHg — ABNORMAL HIGH (ref 32.0–48.0)
pCO2 arterial: 51 mmHg — ABNORMAL HIGH (ref 32.0–48.0)
pCO2 arterial: 47 mmHg (ref 32.0–48.0)
pCO2 arterial: 47.8 mmHg (ref 32.0–48.0)
pH, Arterial: 7.294 — ABNORMAL LOW (ref 7.350–7.450)
pH, Arterial: 7.368 (ref 7.350–7.450)
pH, Arterial: 7.382 (ref 7.350–7.450)
pH, Arterial: 7.267 — ABNORMAL LOW (ref 7.350–7.450)
pH, Arterial: 7.384 (ref 7.350–7.450)
pH, Arterial: 7.43 (ref 7.350–7.450)
pH, Arterial: 7.128 — CL (ref 7.350–7.450)
pH, Arterial: 7.323 — ABNORMAL LOW (ref 7.350–7.450)
pH, Arterial: 7.238 — ABNORMAL LOW (ref 7.350–7.450)
pH, Arterial: 7.287 — ABNORMAL LOW (ref 7.350–7.450)
pH, Arterial: 7.313 — ABNORMAL LOW (ref 7.350–7.450)
pH, Arterial: 7.332 — ABNORMAL LOW (ref 7.350–7.450)
pO2, Arterial: 445 mmHg — ABNORMAL HIGH (ref 83.0–108.0)
pO2, Arterial: 386 mmHg — ABNORMAL HIGH (ref 83.0–108.0)
pO2, Arterial: 49 mmHg — ABNORMAL LOW (ref 83.0–108.0)
pO2, Arterial: 210 mmHg — ABNORMAL HIGH (ref 83.0–108.0)
pO2, Arterial: 391 mmHg — ABNORMAL HIGH (ref 83.0–108.0)
pO2, Arterial: 419 mmHg — ABNORMAL HIGH (ref 83.0–108.0)
pO2, Arterial: 354 mmHg — ABNORMAL HIGH (ref 83.0–108.0)
pO2, Arterial: 493 mmHg — ABNORMAL HIGH (ref 83.0–108.0)
pO2, Arterial: 106 mmHg (ref 83.0–108.0)
pO2, Arterial: 99 mmHg (ref 83.0–108.0)
pO2, Arterial: 82 mmHg — ABNORMAL LOW (ref 83.0–108.0)
pO2, Arterial: 81 mmHg — ABNORMAL LOW (ref 83.0–108.0)

## 2020-04-23 LAB — BASIC METABOLIC PANEL
Anion gap: 7 (ref 5–15)
BUN: 9 mg/dL (ref 6–20)
CO2: 24 mmol/L (ref 22–32)
Calcium: 8.8 mg/dL — ABNORMAL LOW (ref 8.9–10.3)
Chloride: 107 mmol/L (ref 98–111)
Creatinine, Ser: 0.97 mg/dL (ref 0.44–1.00)
GFR, Estimated: >60 mL/min (ref >60)
Glucose, Bld: 116 mg/dL — ABNORMAL HIGH (ref 70–99)
Potassium: 4.4 mmol/L (ref 3.5–5.1)
Sodium: 138 mmol/L (ref 135–145)

## 2020-04-23 LAB — CBC
HCT: 41.1 % (ref 36.0–46.0)
HCT: 38.2 % (ref 36.0–46.0)
HCT: 32.2 % — ABNORMAL LOW (ref 36.0–46.0)
Hemoglobin: 13.5 g/dL (ref 12.0–15.0)
Hemoglobin: 12.7 g/dL (ref 12.0–15.0)
Hemoglobin: 10.8 g/dL — ABNORMAL LOW (ref 12.0–15.0)
MCH: 30.3 pg (ref 26.0–34.0)
MCH: 30.1 pg (ref 26.0–34.0)
MCH: 30.8 pg (ref 26.0–34.0)
MCHC: 32.8 g/dL (ref 30.0–36.0)
MCHC: 33.2 g/dL (ref 30.0–36.0)
MCHC: 33.5 g/dL (ref 30.0–36.0)
MCV: 92.2 fL (ref 80.0–100.0)
MCV: 90.5 fL (ref 80.0–100.0)
MCV: 91.7 fL (ref 80.0–100.0)
Platelets: 214 10*3/uL (ref 150–400)
Platelets: 100 10*3/uL — ABNORMAL LOW (ref 150–400)
Platelets: 115 10*3/uL — ABNORMAL LOW (ref 150–400)
RBC: 4.46 MIL/uL (ref 3.87–5.11)
RBC: 4.22 MIL/uL (ref 3.87–5.11)
RBC: 3.51 MIL/uL — ABNORMAL LOW (ref 3.87–5.11)
RDW: 14.6 % (ref 11.5–15.5)
RDW: 15.3 % (ref 11.5–15.5)
RDW: 15.9 % — ABNORMAL HIGH (ref 11.5–15.5)
WBC: 6.8 10*3/uL (ref 4.0–10.5)
WBC: 17.4 10*3/uL — ABNORMAL HIGH (ref 4.0–10.5)
WBC: 12 10*3/uL — ABNORMAL HIGH (ref 4.0–10.5)
nRBC: 0 % (ref 0.0–0.2)
nRBC: 0 % (ref 0.0–0.2)
nRBC: 0 % (ref 0.0–0.2)

## 2020-04-23 LAB — POCT I-STAT, CHEM 8
BUN: 9 mg/dL (ref 6–20)
BUN: 8 mg/dL (ref 6–20)
BUN: 9 mg/dL (ref 6–20)
BUN: 9 mg/dL (ref 6–20)
BUN: 8 mg/dL (ref 6–20)
BUN: 9 mg/dL (ref 6–20)
BUN: 9 mg/dL (ref 6–20)
BUN: 10 mg/dL (ref 6–20)
BUN: 10 mg/dL (ref 6–20)
Calcium, Ion: 1.3 mmol/L (ref 1.15–1.40)
Calcium, Ion: 1.06 mmol/L — ABNORMAL LOW (ref 1.15–1.40)
Calcium, Ion: 1.25 mmol/L (ref 1.15–1.40)
Calcium, Ion: 0.99 mmol/L — ABNORMAL LOW (ref 1.15–1.40)
Calcium, Ion: 1.04 mmol/L — ABNORMAL LOW (ref 1.15–1.40)
Calcium, Ion: 1.1 mmol/L — ABNORMAL LOW (ref 1.15–1.40)
Calcium, Ion: 1.35 mmol/L (ref 1.15–1.40)
Calcium, Ion: 1.08 mmol/L — ABNORMAL LOW (ref 1.15–1.40)
Calcium, Ion: 0.95 mmol/L — ABNORMAL LOW (ref 1.15–1.40)
Chloride: 106 mmol/L (ref 98–111)
Chloride: 104 mmol/L (ref 98–111)
Chloride: 108 mmol/L (ref 98–111)
Chloride: 105 mmol/L (ref 98–111)
Chloride: 101 mmol/L (ref 98–111)
Chloride: 101 mmol/L (ref 98–111)
Chloride: 107 mmol/L (ref 98–111)
Chloride: 104 mmol/L (ref 98–111)
Chloride: 107 mmol/L (ref 98–111)
Creatinine, Ser: 0.8 mg/dL (ref 0.44–1.00)
Creatinine, Ser: 0.7 mg/dL (ref 0.44–1.00)
Creatinine, Ser: 0.8 mg/dL (ref 0.44–1.00)
Creatinine, Ser: 0.7 mg/dL (ref 0.44–1.00)
Creatinine, Ser: 0.7 mg/dL (ref 0.44–1.00)
Creatinine, Ser: 0.8 mg/dL (ref 0.44–1.00)
Creatinine, Ser: 0.8 mg/dL (ref 0.44–1.00)
Creatinine, Ser: 0.9 mg/dL (ref 0.44–1.00)
Creatinine, Ser: 0.8 mg/dL (ref 0.44–1.00)
Glucose, Bld: 123 mg/dL — ABNORMAL HIGH (ref 70–99)
Glucose, Bld: 104 mg/dL — ABNORMAL HIGH (ref 70–99)
Glucose, Bld: 116 mg/dL — ABNORMAL HIGH (ref 70–99)
Glucose, Bld: 147 mg/dL — ABNORMAL HIGH (ref 70–99)
Glucose, Bld: 300 mg/dL — ABNORMAL HIGH (ref 70–99)
Glucose, Bld: 355 mg/dL — ABNORMAL HIGH (ref 70–99)
Glucose, Bld: 440 mg/dL — ABNORMAL HIGH (ref 70–99)
Glucose, Bld: 238 mg/dL — ABNORMAL HIGH (ref 70–99)
Glucose, Bld: 178 mg/dL — ABNORMAL HIGH (ref 70–99)
HCT: 38 % (ref 36.0–46.0)
HCT: 28 % — ABNORMAL LOW (ref 36.0–46.0)
HCT: 36 % (ref 36.0–46.0)
HCT: 25 % — ABNORMAL LOW (ref 36.0–46.0)
HCT: 17 % — ABNORMAL LOW (ref 36.0–46.0)
HCT: 19 % — ABNORMAL LOW (ref 36.0–46.0)
HCT: 26 % — ABNORMAL LOW (ref 36.0–46.0)
HCT: 24 % — ABNORMAL LOW (ref 36.0–46.0)
HCT: 34 % — ABNORMAL LOW (ref 36.0–46.0)
Hemoglobin: 12.9 g/dL (ref 12.0–15.0)
Hemoglobin: 12.2 g/dL (ref 12.0–15.0)
Hemoglobin: 9.5 g/dL — ABNORMAL LOW (ref 12.0–15.0)
Hemoglobin: 8.5 g/dL — ABNORMAL LOW (ref 12.0–15.0)
Hemoglobin: 5.8 g/dL — CL (ref 12.0–15.0)
Hemoglobin: 6.5 g/dL — CL (ref 12.0–15.0)
Hemoglobin: 8.8 g/dL — ABNORMAL LOW (ref 12.0–15.0)
Hemoglobin: 8.2 g/dL — ABNORMAL LOW (ref 12.0–15.0)
Hemoglobin: 11.6 g/dL — ABNORMAL LOW (ref 12.0–15.0)
Potassium: 4.6 mmol/L (ref 3.5–5.1)
Potassium: 4.8 mmol/L (ref 3.5–5.1)
Potassium: 6.8 mmol/L (ref 3.5–5.1)
Potassium: 6.8 mmol/L (ref 3.5–5.1)
Potassium: 4.5 mmol/L (ref 3.5–5.1)
Potassium: 4.8 mmol/L (ref 3.5–5.1)
Potassium: 5 mmol/L (ref 3.5–5.1)
Potassium: 4.5 mmol/L (ref 3.5–5.1)
Potassium: 4.7 mmol/L (ref 3.5–5.1)
Sodium: 142 mmol/L (ref 135–145)
Sodium: 135 mmol/L (ref 135–145)
Sodium: 140 mmol/L (ref 135–145)
Sodium: 137 mmol/L (ref 135–145)
Sodium: 133 mmol/L — ABNORMAL LOW (ref 135–145)
Sodium: 137 mmol/L (ref 135–145)
Sodium: 138 mmol/L (ref 135–145)
Sodium: 141 mmol/L (ref 135–145)
Sodium: 144 mmol/L (ref 135–145)
TCO2: 26 mmol/L (ref 22–32)
TCO2: 23 mmol/L (ref 22–32)
TCO2: 26 mmol/L (ref 22–32)
TCO2: 25 mmol/L (ref 22–32)
TCO2: 17 mmol/L — ABNORMAL LOW (ref 22–32)
TCO2: 19 mmol/L — ABNORMAL LOW (ref 22–32)
TCO2: 24 mmol/L (ref 22–32)
TCO2: 26 mmol/L (ref 22–32)
TCO2: 24 mmol/L (ref 22–32)

## 2020-04-23 LAB — URINALYSIS, ROUTINE W REFLEX MICROSCOPIC
Bilirubin Urine: NEGATIVE
Glucose, UA: NEGATIVE mg/dL
Ketones, ur: NEGATIVE mg/dL
Leukocytes,Ua: NEGATIVE
Nitrite: NEGATIVE
Protein, ur: NEGATIVE mg/dL
Specific Gravity, Urine: 1.011 (ref 1.005–1.030)
pH: 5 (ref 5.0–8.0)

## 2020-04-23 LAB — HEMOGLOBIN AND HEMATOCRIT, BLOOD
HCT: 29.2 % — ABNORMAL LOW (ref 36.0–46.0)
HCT: 20.7 % — ABNORMAL LOW (ref 36.0–46.0)
Hemoglobin: 9.6 g/dL — ABNORMAL LOW (ref 12.0–15.0)
Hemoglobin: 6.8 g/dL — CL (ref 12.0–15.0)

## 2020-04-23 LAB — ECHO INTRAOPERATIVE TEE
Height: 64 in
Weight: 2646.4 oz

## 2020-04-23 LAB — BLOOD GAS, ARTERIAL
Acid-base deficit: 1.6 mmol/L (ref 0.0–2.0)
Bicarbonate: 23.1 mmol/L (ref 20.0–28.0)
FIO2: 21
O2 Saturation: 95.9 %
Patient temperature: 36.4
pCO2 arterial: 41.1 mmHg (ref 32.0–48.0)
pH, Arterial: 7.365 (ref 7.350–7.450)
pO2, Arterial: 81.3 mmHg — ABNORMAL LOW (ref 83.0–108.0)

## 2020-04-23 LAB — COMPREHENSIVE METABOLIC PANEL
ALT: 63 U/L — ABNORMAL HIGH (ref 0–44)
AST: 127 U/L — ABNORMAL HIGH (ref 15–41)
Albumin: 3.3 g/dL — ABNORMAL LOW (ref 3.5–5.0)
Alkaline Phosphatase: 43 U/L (ref 38–126)
Anion gap: 7 (ref 5–15)
BUN: 10 mg/dL (ref 6–20)
CO2: 24 mmol/L (ref 22–32)
Calcium: 7.6 mg/dL — ABNORMAL LOW (ref 8.9–10.3)
Chloride: 110 mmol/L (ref 98–111)
Creatinine, Ser: 1.03 mg/dL — ABNORMAL HIGH (ref 0.44–1.00)
GFR, Estimated: >60 mL/min (ref >60)
Glucose, Bld: 168 mg/dL — ABNORMAL HIGH (ref 70–99)
Potassium: 4.6 mmol/L (ref 3.5–5.1)
Sodium: 141 mmol/L (ref 135–145)
Total Bilirubin: 1.1 mg/dL (ref 0.3–1.2)
Total Protein: 5 g/dL — ABNORMAL LOW (ref 6.5–8.1)

## 2020-04-23 LAB — PLATELET COUNT
Platelets: 151 10*3/uL (ref 150–400)
Platelets: 81 10*3/uL — ABNORMAL LOW (ref 150–400)

## 2020-04-23 LAB — HEPATIC FUNCTION PANEL
ALT: 17 U/L (ref 0–44)
AST: 18 U/L (ref 15–41)
Albumin: 3.5 g/dL (ref 3.5–5.0)
Alkaline Phosphatase: 78 U/L (ref 38–126)
Bilirubin, Direct: <0.1 mg/dL (ref 0.0–0.2)
Total Bilirubin: 0.4 mg/dL (ref 0.3–1.2)
Total Protein: 6.3 g/dL — ABNORMAL LOW (ref 6.5–8.1)

## 2020-04-23 LAB — GLUCOSE, CAPILLARY
Glucose-Capillary: 118 mg/dL — ABNORMAL HIGH (ref 70–99)
Glucose-Capillary: 179 mg/dL — ABNORMAL HIGH (ref 70–99)
Glucose-Capillary: 175 mg/dL — ABNORMAL HIGH (ref 70–99)
Glucose-Capillary: 152 mg/dL — ABNORMAL HIGH (ref 70–99)
Glucose-Capillary: 147 mg/dL — ABNORMAL HIGH (ref 70–99)
Glucose-Capillary: 148 mg/dL — ABNORMAL HIGH (ref 70–99)
Glucose-Capillary: 139 mg/dL — ABNORMAL HIGH (ref 70–99)
Glucose-Capillary: 180 mg/dL — ABNORMAL HIGH (ref 70–99)
Glucose-Capillary: 167 mg/dL — ABNORMAL HIGH (ref 70–99)

## 2020-04-23 LAB — HEMOGLOBIN A1C
Hgb A1c MFr Bld: 6.9 % — ABNORMAL HIGH (ref 4.8–5.6)
Mean Plasma Glucose: 151.33 mg/dL

## 2020-04-23 LAB — PROTIME-INR
INR: 1 (ref 0.8–1.2)
INR: 1.6 — ABNORMAL HIGH (ref 0.8–1.2)
Prothrombin Time: 12.8 seconds (ref 11.4–15.2)
Prothrombin Time: 18.9 seconds — ABNORMAL HIGH (ref 11.4–15.2)

## 2020-04-23 LAB — APTT
aPTT: 27 seconds (ref 24–36)
aPTT: 31 seconds (ref 24–36)

## 2020-04-23 LAB — FIBRINOGEN: Fibrinogen: 91 mg/dL — CL (ref 210–475)

## 2020-04-23 LAB — PREPARE RBC (CROSSMATCH)

## 2020-04-23 LAB — MAGNESIUM: Magnesium: 2.9 mg/dL — ABNORMAL HIGH (ref 1.7–2.4)

## 2020-04-23 SURGERY — CORONARY ARTERY BYPASS GRAFTING (CABG)
Anesthesia: General | Site: Chest

## 2020-04-23 MED ORDER — ROCURONIUM BROMIDE 10 MG/ML (PF) SYRINGE
PREFILLED_SYRINGE | INTRAVENOUS | Status: AC
  Filled 2020-04-23: qty 20

## 2020-04-23 MED ORDER — CHLORHEXIDINE GLUCONATE 0.12% ORAL RINSE (MEDLINE KIT)
15.0000 mL | Freq: Two times a day (BID) | OROMUCOSAL | Status: DC
  Administered 2020-04-23 – 2020-04-28 (×10): 15 mL via OROMUCOSAL

## 2020-04-23 MED ORDER — FENTANYL CITRATE (PF) 250 MCG/5ML IJ SOLN
INTRAMUSCULAR | Status: AC
  Filled 2020-04-23: qty 25

## 2020-04-23 MED ORDER — DEXTROSE 50 % IV SOLN
INTRAVENOUS | Status: DC | PRN
  Administered 2020-04-23: 25 g via INTRAVENOUS

## 2020-04-23 MED ORDER — MIDAZOLAM HCL (PF) 10 MG/2ML IJ SOLN
INTRAMUSCULAR | Status: AC
  Filled 2020-04-23: qty 2

## 2020-04-23 MED ORDER — INSULIN REGULAR(HUMAN) IN NACL 100-0.9 UT/100ML-% IV SOLN
INTRAVENOUS | Status: DC
  Administered 2020-04-24: 1.7 [IU]/h via INTRAVENOUS
  Filled 2020-04-23: qty 100

## 2020-04-23 MED ORDER — SODIUM CHLORIDE 0.9% FLUSH: 3.0000 mL | INTRAVENOUS | Status: DC | PRN

## 2020-04-23 MED ORDER — 0.9 % SODIUM CHLORIDE (POUR BTL) OPTIME
TOPICAL | Status: DC | PRN
  Administered 2020-04-23: 5000 mL

## 2020-04-23 MED ORDER — TRAMADOL HCL 50 MG PO TABS: 50.0000 mg | ORAL_TABLET | ORAL | Status: DC | PRN

## 2020-04-23 MED ORDER — VANCOMYCIN HCL 1000 MG IV SOLR
INTRAVENOUS | Status: AC
  Filled 2020-04-23: qty 3000

## 2020-04-23 MED ORDER — SODIUM CHLORIDE (PF) 0.9 % IJ SOLN
OROMUCOSAL | Status: DC | PRN
  Administered 2020-04-23 (×2): 4 mL via TOPICAL

## 2020-04-23 MED ORDER — OXYCODONE HCL 5 MG PO TABS
5.0000 mg | ORAL_TABLET | ORAL | Status: DC | PRN
  Administered 2020-04-24 – 2020-04-25 (×2): 10 mg via ORAL
  Filled 2020-04-23 (×2): qty 2

## 2020-04-23 MED ORDER — PHENYLEPHRINE HCL-NACL 20-0.9 MG/250ML-% IV SOLN: 0.0000 ug/min | INTRAVENOUS | Status: DC

## 2020-04-23 MED ORDER — BISACODYL 5 MG PO TBEC
10.0000 mg | DELAYED_RELEASE_TABLET | Freq: Every day | ORAL | Status: DC
  Administered 2020-04-25 – 2020-05-01 (×4): 10 mg via ORAL
  Filled 2020-04-23 (×5): qty 2

## 2020-04-23 MED ORDER — LACTATED RINGERS IV SOLN: INTRAVENOUS | Status: DC | PRN

## 2020-04-23 MED ORDER — THROMBIN 5000 UNITS EX SOLR
INTRAVENOUS | Status: DC | PRN
  Administered 2020-04-23: 2 mL

## 2020-04-23 MED ORDER — PROPOFOL 10 MG/ML IV BOLUS
INTRAVENOUS | Status: DC | PRN
  Administered 2020-04-23: 20 mg via INTRAVENOUS
  Administered 2020-04-23: 30 mg via INTRAVENOUS
  Administered 2020-04-23: 20 mg via INTRAVENOUS

## 2020-04-23 MED ORDER — PLATELET RICH PLASMA OPTIME
Status: DC | PRN
  Administered 2020-04-23: 10 mL

## 2020-04-23 MED ORDER — POTASSIUM CHLORIDE 10 MEQ/50ML IV SOLN: 10.0000 meq | INTRAVENOUS | Status: AC

## 2020-04-23 MED ORDER — LACTATED RINGERS IV SOLN: 500.0000 mL | Freq: Once | INTRAVENOUS | Status: DC | PRN

## 2020-04-23 MED ORDER — MIDAZOLAM HCL 2 MG/2ML IJ SOLN
2.0000 mg | INTRAMUSCULAR | Status: DC | PRN
  Administered 2020-04-23 – 2020-04-24 (×7): 2 mg via INTRAVENOUS
  Filled 2020-04-23 (×7): qty 2

## 2020-04-23 MED ORDER — PROTAMINE SULFATE 10 MG/ML IV SOLN
INTRAVENOUS | Status: AC
  Filled 2020-04-23: qty 5

## 2020-04-23 MED ORDER — SODIUM CHLORIDE 0.9 % IV SOLN
250.0000 mL | INTRAVENOUS | Status: DC
  Administered 2020-04-24: 250 mL via INTRAVENOUS

## 2020-04-23 MED ORDER — SODIUM CHLORIDE 0.9 % IV SOLN: INTRAVENOUS | Status: DC | PRN

## 2020-04-23 MED ORDER — BUPIVACAINE HCL (PF) 0.5 % IJ SOLN
INTRAMUSCULAR | Status: AC
  Filled 2020-04-23: qty 30

## 2020-04-23 MED ORDER — EPINEPHRINE HCL 5 MG/250ML IV SOLN IN NS: 0.5000 ug/min | INTRAVENOUS | Status: DC

## 2020-04-23 MED ORDER — LACTATED RINGERS IV SOLN: INTRAVENOUS | Status: DC

## 2020-04-23 MED ORDER — SODIUM CHLORIDE 0.45 % IV SOLN: INTRAVENOUS | Status: DC | PRN

## 2020-04-23 MED ORDER — ORAL CARE MOUTH RINSE: 15.0000 mL | OROMUCOSAL | Status: DC

## 2020-04-23 MED ORDER — NITROGLYCERIN IN D5W 200-5 MCG/ML-% IV SOLN
0.0000 ug/min | INTRAVENOUS | Status: DC
  Administered 2020-04-24: 100 ug/min via INTRAVENOUS
  Administered 2020-04-24: 66.667 ug/min via INTRAVENOUS
  Filled 2020-04-23 (×2): qty 250

## 2020-04-23 MED ORDER — SODIUM CHLORIDE 0.9 % IV SOLN
1.5000 g | Freq: Two times a day (BID) | INTRAVENOUS | Status: AC
  Administered 2020-04-23 – 2020-04-25 (×4): 1.5 g via INTRAVENOUS
  Filled 2020-04-23 (×5): qty 1.5

## 2020-04-23 MED ORDER — SODIUM CHLORIDE 0.9% FLUSH
3.0000 mL | Freq: Two times a day (BID) | INTRAVENOUS | Status: DC
  Administered 2020-04-24 – 2020-04-30 (×13): 3 mL via INTRAVENOUS

## 2020-04-23 MED ORDER — INSULIN REGULAR BOLUS VIA INFUSION
10.0000 [IU] | Freq: Once | INTRAVENOUS | Status: AC
  Administered 2020-04-23: 10 [IU] via INTRAVENOUS
  Filled 2020-04-23: qty 10

## 2020-04-23 MED ORDER — PLATELET POOR PLASMA OPTIME
Status: DC | PRN
  Administered 2020-04-23: 10 mL

## 2020-04-23 MED ORDER — ALBUMIN HUMAN 5 % IV SOLN
250.0000 mL | INTRAVENOUS | Status: AC | PRN
  Administered 2020-04-23 – 2020-04-24 (×4): 12.5 g via INTRAVENOUS
  Filled 2020-04-23 (×3): qty 250

## 2020-04-23 MED ORDER — PANTOPRAZOLE SODIUM 40 MG PO TBEC
40.0000 mg | DELAYED_RELEASE_TABLET | Freq: Every day | ORAL | Status: DC
  Administered 2020-04-25 – 2020-05-01 (×7): 40 mg via ORAL
  Filled 2020-04-23 (×7): qty 1

## 2020-04-23 MED ORDER — ROCURONIUM BROMIDE 10 MG/ML (PF) SYRINGE
PREFILLED_SYRINGE | INTRAVENOUS | Status: AC
  Filled 2020-04-23: qty 10

## 2020-04-23 MED ORDER — PROTAMINE SULFATE 10 MG/ML IV SOLN
INTRAVENOUS | Status: AC
  Filled 2020-04-23: qty 25

## 2020-04-23 MED ORDER — PROTAMINE SULFATE 10 MG/ML IV SOLN
INTRAVENOUS | Status: DC | PRN
  Administered 2020-04-23: 270 mg via INTRAVENOUS
  Administered 2020-04-23: 300 mg via INTRAVENOUS

## 2020-04-23 MED ORDER — MILRINONE LACTATE IN DEXTROSE 20-5 MG/100ML-% IV SOLN
0.2500 ug/kg/min | INTRAVENOUS | Status: DC
  Administered 2020-04-24 (×2): 0.25 ug/kg/min via INTRAVENOUS
  Filled 2020-04-23 (×2): qty 100

## 2020-04-23 MED ORDER — CHLORHEXIDINE GLUCONATE 0.12 % MT SOLN
15.0000 mL | OROMUCOSAL | Status: AC
  Administered 2020-04-23: 15 mL via OROMUCOSAL

## 2020-04-23 MED ORDER — SODIUM CHLORIDE 0.9% IV SOLUTION: Freq: Once | INTRAVENOUS | Status: DC

## 2020-04-23 MED ORDER — ONDANSETRON HCL 4 MG/2ML IJ SOLN: 4.0000 mg | Freq: Four times a day (QID) | INTRAMUSCULAR | Status: DC | PRN

## 2020-04-23 MED ORDER — BISACODYL 10 MG RE SUPP: 10.0000 mg | Freq: Every day | RECTAL | Status: DC

## 2020-04-23 MED ORDER — LEVALBUTEROL HCL 0.63 MG/3ML IN NEBU
0.6300 mg | INHALATION_SOLUTION | Freq: Four times a day (QID) | RESPIRATORY_TRACT | Status: DC
  Administered 2020-04-23 – 2020-04-24 (×3): 0.63 mg via RESPIRATORY_TRACT
  Filled 2020-04-23 (×3): qty 3

## 2020-04-23 MED ORDER — ORAL CARE MOUTH RINSE
15.0000 mL | OROMUCOSAL | Status: DC
  Administered 2020-04-23 – 2020-04-24 (×7): 15 mL via OROMUCOSAL

## 2020-04-23 MED ORDER — PHENYLEPHRINE 40 MCG/ML (10ML) SYRINGE FOR IV PUSH (FOR BLOOD PRESSURE SUPPORT)
PREFILLED_SYRINGE | INTRAVENOUS | Status: AC
  Filled 2020-04-23: qty 10

## 2020-04-23 MED ORDER — PLASMA-LYTE A IV SOLN: INTRAVENOUS | Status: DC

## 2020-04-23 MED ORDER — HEMOSTATIC AGENTS (NO CHARGE) OPTIME
TOPICAL | Status: DC | PRN
  Administered 2020-04-23 (×2): 1 via TOPICAL

## 2020-04-23 MED ORDER — INSULIN REGULAR(HUMAN) IN NACL 100-0.9 UT/100ML-% IV SOLN: INTRAVENOUS | Status: DC | PRN

## 2020-04-23 MED ORDER — PLASMA-LYTE 148 IV SOLN
INTRAVENOUS | Status: DC | PRN
  Administered 2020-04-23: 250 mL via INTRAVASCULAR

## 2020-04-23 MED ORDER — METOPROLOL TARTRATE 12.5 MG HALF TABLET
12.5000 mg | ORAL_TABLET | Freq: Two times a day (BID) | ORAL | Status: DC
  Administered 2020-04-24: 12.5 mg via ORAL
  Filled 2020-04-23: qty 1

## 2020-04-23 MED ORDER — DEXTROSE 50 % IV SOLN: 0.0000 mL | INTRAVENOUS | Status: DC | PRN

## 2020-04-23 MED ORDER — VANCOMYCIN HCL IN DEXTROSE 1-5 GM/200ML-% IV SOLN
1000.0000 mg | Freq: Once | INTRAVENOUS | Status: AC
  Administered 2020-04-23: 1000 mg via INTRAVENOUS
  Filled 2020-04-23: qty 200

## 2020-04-23 MED ORDER — PHENYLEPHRINE 40 MCG/ML (10ML) SYRINGE FOR IV PUSH (FOR BLOOD PRESSURE SUPPORT)
PREFILLED_SYRINGE | INTRAVENOUS | Status: DC | PRN
  Administered 2020-04-23: 120 ug via INTRAVENOUS
  Administered 2020-04-23: 40 ug via INTRAVENOUS
  Administered 2020-04-23: 80 ug via INTRAVENOUS
  Administered 2020-04-23: 160 ug via INTRAVENOUS
  Administered 2020-04-23 (×5): 80 ug via INTRAVENOUS

## 2020-04-23 MED ORDER — STERILE WATER FOR INJECTION IJ SOLN
INTRAMUSCULAR | Status: AC
  Filled 2020-04-23: qty 10

## 2020-04-23 MED ORDER — FENTANYL CITRATE (PF) 250 MCG/5ML IJ SOLN
INTRAMUSCULAR | Status: DC | PRN
  Administered 2020-04-23: 50 ug via INTRAVENOUS
  Administered 2020-04-23: 950 ug via INTRAVENOUS
  Administered 2020-04-23: 250 ug via INTRAVENOUS

## 2020-04-23 MED ORDER — FAMOTIDINE IN NACL 20-0.9 MG/50ML-% IV SOLN
20.0000 mg | Freq: Two times a day (BID) | INTRAVENOUS | Status: AC
  Administered 2020-04-23 (×2): 20 mg via INTRAVENOUS
  Filled 2020-04-23 (×2): qty 50

## 2020-04-23 MED ORDER — EPINEPHRINE 1 MG/10ML IJ SOSY
PREFILLED_SYRINGE | INTRAMUSCULAR | Status: DC | PRN
  Administered 2020-04-23: .4 mg via INTRAVENOUS
  Administered 2020-04-23 (×2): .2 mg via INTRAVENOUS
  Administered 2020-04-23 (×2): .1 mg via INTRAVENOUS
  Administered 2020-04-23 (×5): .2 mg via INTRAVENOUS

## 2020-04-23 MED ORDER — PROPOFOL 10 MG/ML IV BOLUS
INTRAVENOUS | Status: AC
  Filled 2020-04-23: qty 20

## 2020-04-23 MED ORDER — DEXMEDETOMIDINE HCL IN NACL 400 MCG/100ML IV SOLN
0.0000 ug/kg/h | INTRAVENOUS | Status: DC
Start: 2020-04-23 — End: 2020-04-24
  Administered 2020-04-23: 0.6 ug/kg/h via INTRAVENOUS
  Administered 2020-04-24 (×2): 0.7 ug/kg/h via INTRAVENOUS
  Filled 2020-04-23 (×3): qty 100

## 2020-04-23 MED ORDER — HEPARIN SODIUM (PORCINE) 1000 UNIT/ML IJ SOLN
INTRAMUSCULAR | Status: AC
  Filled 2020-04-23: qty 1

## 2020-04-23 MED ORDER — CHLORHEXIDINE GLUCONATE 0.12% ORAL RINSE (MEDLINE KIT)
15.0000 mL | Freq: Two times a day (BID) | OROMUCOSAL | Status: DC
Start: 2020-04-23 — End: 2020-04-23

## 2020-04-23 MED ORDER — SODIUM CHLORIDE 0.9 % IV SOLN: INTRAVENOUS | Status: DC

## 2020-04-23 MED ORDER — ROCURONIUM BROMIDE 10 MG/ML (PF) SYRINGE
PREFILLED_SYRINGE | INTRAVENOUS | Status: DC | PRN
  Administered 2020-04-23: 30 mg via INTRAVENOUS
  Administered 2020-04-23: 20 mg via INTRAVENOUS
  Administered 2020-04-23 (×2): 30 mg via INTRAVENOUS
  Administered 2020-04-23: 50 mg via INTRAVENOUS
  Administered 2020-04-23: 70 mg via INTRAVENOUS

## 2020-04-23 MED ORDER — CHLORHEXIDINE GLUCONATE CLOTH 2 % EX PADS
6.0000 | MEDICATED_PAD | Freq: Every day | CUTANEOUS | Status: DC
  Administered 2020-04-24 – 2020-04-30 (×6): 6 via TOPICAL

## 2020-04-23 MED ORDER — LEVALBUTEROL TARTRATE 45 MCG/ACT IN AERO
2.0000 | INHALATION_SPRAY | Freq: Four times a day (QID) | RESPIRATORY_TRACT | Status: DC
  Filled 2020-04-23: qty 15

## 2020-04-23 MED ORDER — VANCOMYCIN HCL 1000 MG IV SOLR
INTRAVENOUS | Status: DC | PRN
  Administered 2020-04-23: 3 g

## 2020-04-23 MED ORDER — BUPIVACAINE LIPOSOME 1.3 % IJ SUSP
INTRAMUSCULAR | Status: DC | PRN
  Administered 2020-04-23: 50 mL

## 2020-04-23 MED ORDER — ALBUMIN HUMAN 5 % IV SOLN: INTRAVENOUS | Status: DC | PRN

## 2020-04-23 MED ORDER — METOPROLOL TARTRATE 25 MG/10 ML ORAL SUSPENSION: 12.5000 mg | Freq: Two times a day (BID) | ORAL | Status: DC

## 2020-04-23 MED ORDER — METOPROLOL TARTRATE 5 MG/5ML IV SOLN
2.5000 mg | INTRAVENOUS | Status: DC | PRN
  Administered 2020-04-24: 5 mg via INTRAVENOUS
  Administered 2020-04-24: 2.5 mg via INTRAVENOUS
  Administered 2020-04-26 – 2020-04-27 (×3): 5 mg via INTRAVENOUS
  Filled 2020-04-23 (×6): qty 5

## 2020-04-23 MED ORDER — MORPHINE SULFATE (PF) 2 MG/ML IV SOLN
1.0000 mg | INTRAVENOUS | Status: DC | PRN
  Administered 2020-04-23 – 2020-04-25 (×11): 2 mg via INTRAVENOUS
  Filled 2020-04-23 (×11): qty 1

## 2020-04-23 MED ORDER — ASPIRIN 81 MG PO CHEW: 324.0000 mg | CHEWABLE_TABLET | Freq: Every day | ORAL | Status: DC

## 2020-04-23 MED ORDER — HYDROCORTISONE NA SUCCINATE PF 100 MG IJ SOLR
INTRAMUSCULAR | Status: DC | PRN
  Administered 2020-04-23: 125 mg via INTRAVENOUS

## 2020-04-23 MED ORDER — CALCIUM CHLORIDE 10 % IV SOLN
INTRAVENOUS | Status: DC | PRN
  Administered 2020-04-23: 1 g via INTRAVENOUS

## 2020-04-23 MED ORDER — HEPARIN SODIUM (PORCINE) 1000 UNIT/ML IJ SOLN
INTRAMUSCULAR | Status: DC | PRN
  Administered 2020-04-23: 27000 [IU] via INTRAVENOUS
  Administered 2020-04-23: 30000 [IU] via INTRAVENOUS

## 2020-04-23 MED ORDER — BUPIVACAINE LIPOSOME 1.3 % IJ SUSP
INTRAMUSCULAR | Status: AC
  Filled 2020-04-23: qty 20

## 2020-04-23 MED ORDER — NITROGLYCERIN 0.2 MG/ML ON CALL CATH LAB
INTRAVENOUS | Status: DC | PRN
  Administered 2020-04-23 (×2): 20 ug via INTRAVENOUS

## 2020-04-23 MED ORDER — ASPIRIN EC 325 MG PO TBEC: 325.0000 mg | DELAYED_RELEASE_TABLET | Freq: Every day | ORAL | Status: DC

## 2020-04-23 MED ORDER — MIDAZOLAM HCL 5 MG/5ML IJ SOLN
INTRAMUSCULAR | Status: DC | PRN
  Administered 2020-04-23: 2 mg via INTRAVENOUS
  Administered 2020-04-23 (×2): 1 mg via INTRAVENOUS
  Administered 2020-04-23 (×3): 2 mg via INTRAVENOUS

## 2020-04-23 MED ORDER — DOCUSATE SODIUM 100 MG PO CAPS
200.0000 mg | ORAL_CAPSULE | Freq: Every day | ORAL | Status: DC
  Administered 2020-04-25 – 2020-05-01 (×4): 200 mg via ORAL
  Filled 2020-04-23 (×5): qty 2

## 2020-04-23 MED ORDER — MAGNESIUM SULFATE 4 GM/100ML IV SOLN
4.0000 g | Freq: Once | INTRAVENOUS | Status: AC
  Administered 2020-04-23: 4 g via INTRAVENOUS
  Filled 2020-04-23: qty 100

## 2020-04-23 MED FILL — Heparin Sod (Porcine)-NaCl IV Soln 1000 Unit/500ML-0.9%: INTRAVENOUS | Qty: 500 | Status: AC

## 2020-04-23 SURGICAL SUPPLY — 92 items
BAG DECANTER FOR FLEXI CONT (MISCELLANEOUS) ×3 IMPLANT
BALLN IABP SENSA PLUS 7.5F 40C (BALLOONS) ×3
BALLOON IABP SENS PLUS 7.5F40C (BALLOONS) ×2 IMPLANT
BINDER BREAST 3XL (GAUZE/BANDAGES/DRESSINGS) ×3 IMPLANT
BLADE STERNUM SYSTEM 6 (BLADE) ×3 IMPLANT
BNDG ELASTIC 4X5.8 VLCR STR LF (GAUZE/BANDAGES/DRESSINGS) ×3 IMPLANT
BNDG ELASTIC 6X5.8 VLCR STR LF (GAUZE/BANDAGES/DRESSINGS) ×3 IMPLANT
BNDG GAUZE ELAST 4 BULKY (GAUZE/BANDAGES/DRESSINGS) ×3 IMPLANT
CANISTER SUCT 3000ML PPV (MISCELLANEOUS) ×3 IMPLANT
CANNULA AORTIC ROOT 9FR (CANNULA) ×3 IMPLANT
CANNULA NON VENT 20FR 12 (CANNULA) ×3 IMPLANT
CATH CPB KIT HENDRICKSON (MISCELLANEOUS) ×3 IMPLANT
CATH ROBINSON RED A/P 18FR (CATHETERS) ×9 IMPLANT
CLIP RETRACTION 3.0MM CORONARY (MISCELLANEOUS) ×3 IMPLANT
CLIP VESOCCLUDE SM WIDE 24/CT (CLIP) ×9 IMPLANT
CONTAINER PROTECT SURGISLUSH (MISCELLANEOUS) ×6 IMPLANT
COVER PROBE W GEL 5X96 (DRAPES) ×3 IMPLANT
DERMABOND ADVANCED (GAUZE/BANDAGES/DRESSINGS) ×2
DERMABOND ADVANCED .7 DNX12 (GAUZE/BANDAGES/DRESSINGS) ×4 IMPLANT
DRAIN CHANNEL 28F RND 3/8 FF (WOUND CARE) ×9 IMPLANT
DRAPE CARDIOVASCULAR INCISE (DRAPES) ×1
DRAPE SLUSH/WARMER DISC (DRAPES) ×3 IMPLANT
DRAPE SRG 135X102X78XABS (DRAPES) ×2 IMPLANT
DRSG AQUACEL AG ADV 3.5X14 (GAUZE/BANDAGES/DRESSINGS) ×3 IMPLANT
ELECT CAUTERY BLADE 6.4 (BLADE) ×3 IMPLANT
ELECT REM PT RETURN 9FT ADLT (ELECTROSURGICAL) ×6
ELECTRODE REM PT RTRN 9FT ADLT (ELECTROSURGICAL) ×4 IMPLANT
FELT TEFLON 1X6 (MISCELLANEOUS) ×3 IMPLANT
GAUZE SPONGE 4X4 12PLY STRL (GAUZE/BANDAGES/DRESSINGS) ×6 IMPLANT
GAUZE SPONGE 4X4 12PLY STRL LF (GAUZE/BANDAGES/DRESSINGS) ×6 IMPLANT
GLOVE SURG MICRO LTX SZ6.5 (GLOVE) ×18 IMPLANT
GLOVE SURG MICRO LTX SZ8 (GLOVE) ×3 IMPLANT
GLOVE SURG NEOP MICRO LF SZ7.5 (GLOVE) ×3 IMPLANT
GLOVE SURG POLYISO LF SZ7.5 (GLOVE) ×3 IMPLANT
GOWN STRL REUS W/ TWL LRG LVL3 (GOWN DISPOSABLE) ×8 IMPLANT
GOWN STRL REUS W/TWL LRG LVL3 (GOWN DISPOSABLE) ×4
HEMOSTAT POWDER SURGIFOAM 1G (HEMOSTASIS) ×6 IMPLANT
INSERT FOGARTY XLG (MISCELLANEOUS) ×3 IMPLANT
INSERT SUTURE HOLDER (MISCELLANEOUS) ×3 IMPLANT
KIT APPLICATOR RATIO 11:1 (KITS) ×3 IMPLANT
KIT BASIN OR (CUSTOM PROCEDURE TRAY) ×3 IMPLANT
KIT SUCTION CATH 14FR (SUCTIONS) ×3 IMPLANT
KIT TURNOVER KIT B (KITS) ×3 IMPLANT
KIT VASOVIEW HEMOPRO 2 VH 4000 (KITS) ×3 IMPLANT
NEEDLE 18GX1X1/2 (RX/OR ONLY) (NEEDLE) ×3 IMPLANT
NS IRRIG 1000ML POUR BTL (IV SOLUTION) ×15 IMPLANT
PACK E OPEN HEART (SUTURE) ×3 IMPLANT
PACK OPEN HEART (CUSTOM PROCEDURE TRAY) ×3 IMPLANT
PACK PLATELET PROCEDURE 60 (MISCELLANEOUS) ×3 IMPLANT
PACK SPY-PHI (KITS) ×3 IMPLANT
PAD ARMBOARD 7.5X6 YLW CONV (MISCELLANEOUS) ×6 IMPLANT
PAD ELECT DEFIB RADIOL ZOLL (MISCELLANEOUS) ×3 IMPLANT
PENCIL BUTTON HOLSTER BLD 10FT (ELECTRODE) ×3 IMPLANT
POSITIONER HEAD DONUT 9IN (MISCELLANEOUS) ×3 IMPLANT
POWDER SURGICEL 3.0 GRAM (HEMOSTASIS) ×6 IMPLANT
PUNCH AORTIC ROTATE 4.5MM 8IN (MISCELLANEOUS) ×3 IMPLANT
SEALANT SURG COSEAL 8ML (VASCULAR PRODUCTS) ×3 IMPLANT
SET CARDIOPLEGIA MPS 5001102 (MISCELLANEOUS) ×3 IMPLANT
SUT BONE WAX W31G (SUTURE) ×3 IMPLANT
SUT MNCRL AB 3-0 PS2 18 (SUTURE) ×6 IMPLANT
SUT MNCRL AB 4-0 PS2 18 (SUTURE) ×3 IMPLANT
SUT PDS AB 1 CTX 36 (SUTURE) ×6 IMPLANT
SUT PROLENE 3 0 SH DA (SUTURE) ×3 IMPLANT
SUT PROLENE 4 0 RB 1 (SUTURE) ×2
SUT PROLENE 4 0 SH DA (SUTURE) ×3 IMPLANT
SUT PROLENE 4-0 RB1 .5 CRCL 36 (SUTURE) ×4 IMPLANT
SUT PROLENE 5 0 C 1 36 (SUTURE) ×6 IMPLANT
SUT PROLENE 6 0 C 1 30 (SUTURE) ×9 IMPLANT
SUT PROLENE 8 0 BV175 6 (SUTURE) ×3 IMPLANT
SUT SILK  1 MH (SUTURE) ×2
SUT SILK 1 MH (SUTURE) ×4 IMPLANT
SUT SILK 2 0 SH CR/8 (SUTURE) ×3 IMPLANT
SUT SILK 3 0 SH CR/8 (SUTURE) IMPLANT
SUT STEEL 6MS V (SUTURE) ×3 IMPLANT
SUT STEEL SZ 6 DBL 3X14 BALL (SUTURE) ×3 IMPLANT
SUT TEM PAC WIRE 2 0 SH (SUTURE) ×12 IMPLANT
SUT VIC AB 2-0 CT1 27 (SUTURE) ×1
SUT VIC AB 2-0 CT1 TAPERPNT 27 (SUTURE) ×2 IMPLANT
SUT VIC AB 2-0 CTX 27 (SUTURE) IMPLANT
SUT VIC AB 2-0 CTX 36 (SUTURE) ×3 IMPLANT
SUT VIC AB 3-0 X1 27 (SUTURE) IMPLANT
SYR 30ML LL (SYRINGE) ×3 IMPLANT
SYSTEM SAHARA CHEST DRAIN ATS (WOUND CARE) ×3 IMPLANT
TAPE CLOTH SURG 4X10 WHT LF (GAUZE/BANDAGES/DRESSINGS) ×3 IMPLANT
TAPE PAPER 2X10 WHT MICROPORE (GAUZE/BANDAGES/DRESSINGS) ×3 IMPLANT
TIP DUAL SPRAY TOPICAL (TIP) ×6 IMPLANT
TOWEL GREEN STERILE (TOWEL DISPOSABLE) ×3 IMPLANT
TOWEL GREEN STERILE FF (TOWEL DISPOSABLE) ×3 IMPLANT
TRAY FOLEY SLVR 14FR TEMP STAT (SET/KITS/TRAYS/PACK) ×3 IMPLANT
TUBING LAP HI FLOW INSUFFLATIO (TUBING) ×3 IMPLANT
UNDERPAD 30X36 HEAVY ABSORB (UNDERPADS AND DIAPERS) ×3 IMPLANT
WATER STERILE IRR 1000ML POUR (IV SOLUTION) ×6 IMPLANT

## 2020-04-23 NOTE — Anesthesia Procedure Notes (Signed)
Central Venous Catheter Insertion Performed by: Albertha Ghee, MD, anesthesiologist Start/End4/20/2022 7:55 AM, 04/23/2020 8:10 AM Patient location: Pre-op. Preanesthetic checklist: patient identified, IV checked, site marked, risks and benefits discussed, surgical consent, monitors and equipment checked, pre-op evaluation, timeout performed and anesthesia consent Position: Trendelenburg Lidocaine 1% used for infiltration and patient sedated Hand hygiene performed , maximum sterile barriers used  and Seldinger technique used Catheter size: 8.5 Fr Central line was placed.Sheath introducer Procedure performed using ultrasound guided technique. Ultrasound Notes:anatomy identified, needle tip was noted to be adjacent to the nerve/plexus identified, no ultrasound evidence of intravascular and/or intraneural injection and image(s) printed for medical record Attempts: 1 Following insertion, line sutured, dressing applied and Biopatch. Post procedure assessment: blood return through all ports, free fluid flow and no air  Patient tolerated the procedure well with no immediate complications.

## 2020-04-23 NOTE — Anesthesia Procedure Notes (Signed)
Central Venous Catheter Insertion Performed by: Albertha Ghee, MD, anesthesiologist Start/End4/20/2022 8:10 AM, 04/23/2020 8:13 AM Patient location: Pre-op. Preanesthetic checklist: patient identified, IV checked, site marked, risks and benefits discussed, surgical consent, monitors and equipment checked, pre-op evaluation, timeout performed and anesthesia consent Hand hygiene performed  and maximum sterile barriers used  PA cath was placed.Swan type:thermodilution Procedure performed without using ultrasound guided technique. Attempts: 1 Patient tolerated the procedure well with no immediate complications.

## 2020-04-23 NOTE — Brief Op Note (Addendum)
04/22/2020 - 04/23/2020  11:50 AM  PATIENT:  Sabrina Swanson  60 y.o. female  PRE-OPERATIVE DIAGNOSIS:  CORONARY ARTERY DISEASE  POST-OPERATIVE DIAGNOSIS:  CORONARY ARTERY DISEASE  PROCEDURES:   CORONARY ARTERY BYPASS GRAFTING (CABG), ON PUMP, TIMES THREE, USING LEFT INTERNAL MAMMARY ARTERY AND ENDOSCOPICALLY HARVESTED RIGHT GREATER SAPHENOUS VEIN   LIMA->LAD SVG->OM SVG->PDA  INDOCYANINE GREEN FLUORESCENCE IMAGING (ICG)   PLACEMENT OF IABP, LEFT FEMORAL ARTERY  TRANSESOPHAGEAL ECHOCARDIOGRAM (TEE)   SURGEON: [name], MD - Primary  PHYSICIAN ASSISTANT: Vanessa KickLelan Pons, RNFA   ANESTHESIA:   general  EBL:  Per perfusion and anesthesia records  BLOOD ADMINISTERED: PRBC's, Plts,   DRAINS: Left pleural and mediastinal tubes.   LOCAL MEDICATIONS USED:  Exparel  SPECIMEN:  No Specimen  DISPOSITION OF SPECIMEN:  N/A  COUNTS:  YES  DICTATION: .Dragon Dictation  PLAN OF CARE: Admit to inpatient   PATIENT DISPOSITION:  ICU - intubated and hemodynamically stable.   Delay start of Pharmacological VTE agent (>24hrs) due to surgical blood loss or risk of bleeding: yes

## 2020-04-23 NOTE — Plan of Care (Signed)
  Problem: Cardiovascular: Goal: Vascular access site(s) Level 0-1 will be maintained Outcome: Progressing   Problem: Clinical Measurements: Goal: Ability to maintain clinical measurements within normal limits will improve Outcome: Progressing Goal: Will remain free from infection Outcome: Progressing Goal: Diagnostic test results will improve Outcome: Progressing Goal: Respiratory complications will improve Outcome: Progressing Goal: Cardiovascular complication will be avoided Outcome: Progressing

## 2020-04-23 NOTE — Anesthesia Procedure Notes (Signed)
Arterial Line Insertion Start/End4/20/2022 8:02 AM, 04/23/2020 8:05 AM Performed by: Colin Benton, CRNA, CRNA  Patient location: Pre-op. Preanesthetic checklist: patient identified, IV checked, site marked, risks and benefits discussed, surgical consent, monitors and equipment checked, pre-op evaluation, timeout performed and anesthesia consent Lidocaine 1% used for infiltration and patient sedated Right, radial was placed Catheter size: 20 G Hand hygiene performed , maximum sterile barriers used  and Seldinger technique used Allen's test indicative of satisfactory collateral circulation Attempts: 1 Procedure performed without using ultrasound guided technique. Following insertion, dressing applied and Biopatch. Post procedure assessment: normal and unchanged  Patient tolerated the procedure well with no immediate complications.

## 2020-04-23 NOTE — Progress Notes (Addendum)
RT transported Nitric to the OR. Nitric circuit connected to patient by CRNA. O2 connected and battery cord inserted into the wall by RT. Nitric on standby until MD is ready for it to be turned on. CRNA to turn on when ready.

## 2020-04-23 NOTE — H&P (Signed)
History and Physical Interval Note:  04/23/2020 8:32 AM  Sabrina Swanson  has presented today for surgery, with the diagnosis of CAD.  The various methods of treatment have been discussed with the patient and family. After consideration of risks, benefits and other options for treatment, the patient has consented to  Procedure(s): CORONARY ARTERY BYPASS GRAFTING (CABG) (N/A) INDOCYANINE GREEN FLUORESCENCE IMAGING (ICG) (N/A) TRANSESOPHAGEAL ECHOCARDIOGRAM (TEE) (N/A) as a surgical intervention.  The patient's history has been reviewed, patient examined, no change in status, stable for surgery.  I have reviewed the patient's chart and labs.  Questions were answered to the patient's satisfaction.     Sabrina Swanson

## 2020-04-23 NOTE — Progress Notes (Addendum)
[facility] BRIEF SICU PROGRESS NOTE  Day of Surgery  S/P Procedure(s) (LRB): CORONARY ARTERY BYPASS GRAFTING (CABG), ON PUMP, TIMES THREE, USING LEFT INTERNAL MAMMARY ARTERY AND ENDOSCOPICALLY HARVESTED RIGHT GREATER SAPHENOUS VEIN (N/A) INDOCYANINE GREEN FLUORESCENCE IMAGING (ICG) (N/A) TRANSESOPHAGEAL ECHOCARDIOGRAM (TEE) (N/A)   Sedated on vent O2 sats 96% NSR w/ stable hemodynamics, IABP 1:1 w/ nitric oxide 20ppm, milrinone 0.3 and low dose Epi Chest tube output < 100 mL last hour UOP > 100 mL/hr Labs okay EKG not done yet  Plan: Continue current plan - keep sedated on vent for now  Rexene Alberts, MD 04/23/2020 6:32 PM

## 2020-04-23 NOTE — Transfer of Care (Signed)
Immediate Anesthesia Transfer of Care Note  Patient: Sabrina Swanson  Procedure(s) Performed: CORONARY ARTERY BYPASS GRAFTING (CABG), ON PUMP, TIMES THREE, USING LEFT INTERNAL MAMMARY ARTERY AND ENDOSCOPICALLY HARVESTED RIGHT GREATER SAPHENOUS VEIN (N/A Chest) INDOCYANINE GREEN FLUORESCENCE IMAGING (ICG) (N/A ) TRANSESOPHAGEAL ECHOCARDIOGRAM (TEE) (N/A )  Patient Location: ICU  Anesthesia Type:General  Level of Consciousness: sedated and Patient remains intubated per anesthesia plan  Airway & Oxygen Therapy: Patient remains intubated per anesthesia plan and Patient placed on Ventilator (see vital sign flow sheet for setting)  Post-op Assessment: Report given to RN and Post -op Vital signs reviewed and stable  Post vital signs: Reviewed and stable  Last Vitals:  Vitals Value Taken Time  BP    Temp    Pulse 90 04/23/20 1600  Resp 12 04/23/20 1600  SpO2 96 % 04/23/20 1600  Vitals shown include unvalidated device data.  Last Pain:  Vitals:   04/23/20 0553  TempSrc: Oral  PainSc:       Patients Stated Pain Goal: 0 (15/18/34 3735)  Complications: No complications documented.

## 2020-04-23 NOTE — Anesthesia Procedure Notes (Signed)
Procedure Name: Intubation Date/Time: 04/23/2020 9:07 AM Performed by: Colin Benton, CRNA Pre-anesthesia Checklist: Patient identified, Emergency Drugs available, Suction available and Patient being monitored Patient Re-evaluated:Patient Re-evaluated prior to induction Oxygen Delivery Method: Circle system utilized Preoxygenation: Pre-oxygenation with 100% oxygen Induction Type: IV induction Ventilation: Mask ventilation without difficulty Laryngoscope Size: Glidescope and 3 Grade View: Grade I Tube type: Oral Tube size: 7.0 mm Number of attempts: 2 Airway Equipment and Method: Rigid stylet and Video-laryngoscopy Placement Confirmation: ETT inserted through vocal cords under direct vision,  positive ETCO2 and breath sounds checked- equal and bilateral Secured at: 23 cm Tube secured with: Tape Dental Injury: Teeth and Oropharynx as per pre-operative assessment  Difficulty Due To: Difficulty was unanticipated and Difficult Airway- due to anterior larynx Comments: DL x 1 with Mil 2.  Grade 3 view.  Easy BMV.  DL x 2 with glidescope 3.  EBBS and VSS.

## 2020-04-23 NOTE — Op Note (Signed)
CARDIOTHORACIC SURGERY OPERATIVE NOTE  Date of Procedure: 04/23/2020  Preoperative Diagnosis: Severe 3-vessel Coronary Artery Disease  Postoperative Diagnosis: Same  Procedure:    Coronary Artery Bypass Grafting x 3   Left Internal Mammary Artery to Distal Left Anterior Descending Coronary Artery; Saphenous Vein Graft to Posterior Descending Coronary Artery; Saphenous Vein Graft to  Obtuse Marginal Branch of Left Circumflex Coronary Artery, Endoscopic Vein Harvest from right Thigh and Lower Leg Intra-aortic balloon pump insertion  Surgeon: B. Murvin Natal, MD  Assistant: Macarthur Critchley, PA-C  Anesthesia: get  Operative Findings:  Mildly reduced left ventricular systolic function  good quality left internal mammary artery conduit  good quality saphenous vein conduit  good quality target vessels for grafting    BRIEF CLINICAL NOTE AND INDICATIONS FOR SURGERY  60 yo lady with h/o HTN, DM, and tobacco abuse presented with progressive angina. Underwent myocardial stress which was positive for ischemia. This was followed by [facility] showing severe multivessel CAD. She is referred for CABG. She has undergone thorough work-up and is considered a good candidate for the procedure.    DETAILS OF THE OPERATIVE PROCEDURE  Preparation:  The patient is brought to the operating room on the above mentioned date and central monitoring was established by the anesthesia team including placement of Swan-Ganz catheter and radial arterial line. The patient is placed in the supine position on the operating table.  Intravenous antibiotics are administered. General endotracheal anesthesia is induced uneventfully. A Foley catheter is placed.  Baseline transesophageal echocardiogram was performed.  Findings were notable for mildly reduced LV function and intact valvular function  The patient's chest, abdomen, both groins, and both lower extremities are prepared and draped in a sterile manner. A time out  procedure is performed.   Surgical Approach and Conduit Harvest:  A median sternotomy incision was performed and the left internal mammary artery is dissected from the chest wall and prepared for bypass grafting. The left internal mammary artery is notably good quality conduit. Simultaneously, the greater saphenous vein is obtained from the patient's right thigh using endoscopic vein harvest technique. The saphenous vein is notably good quality conduit. After removal of the saphenous vein, the small surgical incisions in the lower extremity are closed with absorbable suture. Following systemic heparinization, the left internal mammary artery was transected distally noted to have excellent flow.   Extracorporeal Cardiopulmonary Bypass and Myocardial Protection:  The pericardium is opened. The ascending aorta is nondiseased in appearance. The ascending aorta and the right atrium are cannulated for cardiopulmonary bypass.  Adequate heparinization is verified.   The entire pre-bypass portion of the operation was notable for stable hemodynamics.  Cardiopulmonary bypass was begun and the surface of the heart is inspected. Distal target vessels are selected for coronary artery bypass grafting. A cardioplegia cannula is placed in the ascending aorta.   The patient is allowed to cool passively to 34C systemic temperature.  The aortic cross clamp is applied and cold blood cardioplegia is delivered initially in an antegrade fashion through the aortic root.  Iced saline slush is applied for topical hypothermia.  The initial cardioplegic arrest is rapid with early diastolic arrest.  Repeat doses of cardioplegia are administered intermittently throughout the entire cross clamp portion of the operation through the aortic root and through subsequently placed vein grafts in order to maintain completely flat electrocardiogram.   Coronary Artery Bypass Grafting:   The posterior descending branch of the right  coronary artery was grafted using a reversed saphenous vein graft in  an end-to-side fashion.  At the site of distal anastomosis the target vessel was good quality and measured approximately 1.5 mm in diameter.  The  obtuse marginal branch of the left circumflex coronary artery was grafted using a reversed saphenous vein graft in an end-to-side fashion.  At the site of distal anastomosis the target vessel was good quality and measured approximately 2 mm in diameter.    The distal left anterior coronary artery was grafted with the left internal mammary artery in an end-to-side fashion.  At the site of distal anastomosis the target vessel was good quality and measured approximately 1.5 mm in diameter. Anastomotic patency and runoff was confirmed with indocyanine green fluorescence imaging (SPY).  All proximal vein graft anastomoses were placed directly to the ascending aorta prior to removal of the aortic cross clamp. A reanimation dose of cardioplegia was delivered, deairing procedures were performed, and the aortic cross clamp was removed.   Procedure Completion:  All proximal and distal coronary anastomoses were inspected for hemostasis and appropriate graft orientation. Epicardial pacing wires are fixed to the right ventricular outflow tract and to the right atrial appendage. The patient is rewarmed to 37C temperature. The patient is weaned and disconnected from cardiopulmonary bypass.  The patient's rhythm at separation from bypass was sinus bradycardia.  The patient was weaned from cardiopulmonary bypass  without any inotropic support.   Followup transesophageal echocardiogram performed after separation from bypass revealed no changes from the preoperative exam.  The aortic and venous cannula were removed uneventfully. Protamine was administered to reverse the anticoagulation. The mediastinum and pleural space were inspected for hemostasis and irrigated with saline solution. The mediastinum and  left pleural space were drained using fluted chest tubes placed through separate stab incisions inferiorly.  The soft tissues anterior to the aorta were reapproximated loosely.   Shortly thereafter, the patient experienced an episode of hypotension which was resistant to IV meds. She was reheparinzed and placed back on CPB. During the process the LIMA was partially avulsed. The heart was rearrested and the LIMA-LAD anastomosis was redone. Anastomotic patency and runoff was confirmed with indocyanine green fluorescence imaging (SPY). The aortic cross clamp was removed. An intra-aortic balloon pump was inserted via the left CFA.  The patient was weaned from CPB and the heart was decannulated. Protamine was given to reverse heparin effect. Significan resusication was required. Once happy with hemostasis, the sternum is closed with double strength sternal wire. The soft tissues anterior to the sternum were closed in multiple layers and the skin is closed with a running subcuticular skin closure.     Disposition:  The patient tolerated the procedure well and is transported to the surgical intensive care in stable condition. There are no intraoperative complications. All sponge instrument and needle counts are verified correct at completion of the operation.    Jayme Cloud, MD 04/23/2020 3:49 PM

## 2020-04-24 ENCOUNTER — Inpatient Hospital Stay (HOSPITAL_COMMUNITY): Payer: Medicare Other

## 2020-04-24 ENCOUNTER — Encounter (HOSPITAL_COMMUNITY): Payer: Self-pay | Admitting: Cardiothoracic Surgery

## 2020-04-24 LAB — POCT I-STAT 7, (LYTES, BLD GAS, ICA,H+H)
Acid-base deficit: 2 mmol/L (ref 0.0–2.0)
Bicarbonate: 23.8 mmol/L (ref 20.0–28.0)
Calcium, Ion: 1.12 mmol/L — ABNORMAL LOW (ref 1.15–1.40)
HCT: 30 % — ABNORMAL LOW (ref 36.0–46.0)
Hemoglobin: 10.2 g/dL — ABNORMAL LOW (ref 12.0–15.0)
O2 Saturation: 97 %
Patient temperature: 36.6
Potassium: 4 mmol/L (ref 3.5–5.1)
Sodium: 144 mmol/L (ref 135–145)
TCO2: 25 mmol/L (ref 22–32)
pCO2 arterial: 42 mmHg (ref 32.0–48.0)
pH, Arterial: 7.359 (ref 7.350–7.450)
pO2, Arterial: 92 mmHg (ref 83.0–108.0)

## 2020-04-24 LAB — GLUCOSE, CAPILLARY
Glucose-Capillary: 120 mg/dL — ABNORMAL HIGH (ref 70–99)
Glucose-Capillary: 121 mg/dL — ABNORMAL HIGH (ref 70–99)
Glucose-Capillary: 122 mg/dL — ABNORMAL HIGH (ref 70–99)
Glucose-Capillary: 122 mg/dL — ABNORMAL HIGH (ref 70–99)
Glucose-Capillary: 123 mg/dL — ABNORMAL HIGH (ref 70–99)
Glucose-Capillary: 125 mg/dL — ABNORMAL HIGH (ref 70–99)
Glucose-Capillary: 127 mg/dL — ABNORMAL HIGH (ref 70–99)
Glucose-Capillary: 127 mg/dL — ABNORMAL HIGH (ref 70–99)
Glucose-Capillary: 129 mg/dL — ABNORMAL HIGH (ref 70–99)
Glucose-Capillary: 130 mg/dL — ABNORMAL HIGH (ref 70–99)
Glucose-Capillary: 131 mg/dL — ABNORMAL HIGH (ref 70–99)
Glucose-Capillary: 134 mg/dL — ABNORMAL HIGH (ref 70–99)
Glucose-Capillary: 138 mg/dL — ABNORMAL HIGH (ref 70–99)
Glucose-Capillary: 138 mg/dL — ABNORMAL HIGH (ref 70–99)
Glucose-Capillary: 141 mg/dL — ABNORMAL HIGH (ref 70–99)
Glucose-Capillary: 141 mg/dL — ABNORMAL HIGH (ref 70–99)
Glucose-Capillary: 142 mg/dL — ABNORMAL HIGH (ref 70–99)
Glucose-Capillary: 142 mg/dL — ABNORMAL HIGH (ref 70–99)
Glucose-Capillary: 145 mg/dL — ABNORMAL HIGH (ref 70–99)
Glucose-Capillary: 152 mg/dL — ABNORMAL HIGH (ref 70–99)
Glucose-Capillary: 155 mg/dL — ABNORMAL HIGH (ref 70–99)

## 2020-04-24 LAB — CBC
HCT: 31.2 % — ABNORMAL LOW (ref 36.0–46.0)
HCT: 29.9 % — ABNORMAL LOW (ref 36.0–46.0)
Hemoglobin: 10.5 g/dL — ABNORMAL LOW (ref 12.0–15.0)
Hemoglobin: 9.9 g/dL — ABNORMAL LOW (ref 12.0–15.0)
MCH: 30.5 pg (ref 26.0–34.0)
MCH: 30.2 pg (ref 26.0–34.0)
MCHC: 33.7 g/dL (ref 30.0–36.0)
MCHC: 33.1 g/dL (ref 30.0–36.0)
MCV: 90.7 fL (ref 80.0–100.0)
MCV: 91.2 fL (ref 80.0–100.0)
Platelets: 112 10*3/uL — ABNORMAL LOW (ref 150–400)
Platelets: 98 10*3/uL — ABNORMAL LOW (ref 150–400)
RBC: 3.44 MIL/uL — ABNORMAL LOW (ref 3.87–5.11)
RBC: 3.28 MIL/uL — ABNORMAL LOW (ref 3.87–5.11)
RDW: 15.9 % — ABNORMAL HIGH (ref 11.5–15.5)
RDW: 16.1 % — ABNORMAL HIGH (ref 11.5–15.5)
WBC: 13 10*3/uL — ABNORMAL HIGH (ref 4.0–10.5)
WBC: 12.8 10*3/uL — ABNORMAL HIGH (ref 4.0–10.5)
nRBC: 0 % (ref 0.0–0.2)
nRBC: 0 % (ref 0.0–0.2)

## 2020-04-24 LAB — BPAM FFP
Blood Product Expiration Date: 202204252359
Blood Product Expiration Date: 202204252359
Blood Product Expiration Date: 202204252359
Blood Product Expiration Date: 202204252359
ISSUE DATE / TIME: 202204201445
ISSUE DATE / TIME: 202204201445
ISSUE DATE / TIME: 202204201814
ISSUE DATE / TIME: 202204201814
Unit Type and Rh: 8400
Unit Type and Rh: 8400
Unit Type and Rh: 2800
Unit Type and Rh: 8400

## 2020-04-24 LAB — PREPARE FRESH FROZEN PLASMA

## 2020-04-24 LAB — BPAM PLATELET PHERESIS
Blood Product Expiration Date: 202204212359
Blood Product Expiration Date: 202204212359
ISSUE DATE / TIME: 202204201248
ISSUE DATE / TIME: 202204201814
Unit Type and Rh: 5100
Unit Type and Rh: 5100

## 2020-04-24 LAB — BASIC METABOLIC PANEL
Anion gap: 6 (ref 5–15)
Anion gap: 5 (ref 5–15)
BUN: 10 mg/dL (ref 6–20)
BUN: 11 mg/dL (ref 6–20)
CO2: 25 mmol/L (ref 22–32)
CO2: 24 mmol/L (ref 22–32)
Calcium: 7.7 mg/dL — ABNORMAL LOW (ref 8.9–10.3)
Calcium: 7.5 mg/dL — ABNORMAL LOW (ref 8.9–10.3)
Chloride: 111 mmol/L (ref 98–111)
Chloride: 112 mmol/L — ABNORMAL HIGH (ref 98–111)
Creatinine, Ser: 0.99 mg/dL (ref 0.44–1.00)
Creatinine, Ser: 0.88 mg/dL (ref 0.44–1.00)
GFR, Estimated: >60 mL/min (ref >60)
GFR, Estimated: >60 mL/min (ref >60)
Glucose, Bld: 129 mg/dL — ABNORMAL HIGH (ref 70–99)
Glucose, Bld: 117 mg/dL — ABNORMAL HIGH (ref 70–99)
Potassium: 3.9 mmol/L (ref 3.5–5.1)
Potassium: 4.4 mmol/L (ref 3.5–5.1)
Sodium: 142 mmol/L (ref 135–145)
Sodium: 141 mmol/L (ref 135–145)

## 2020-04-24 LAB — PREPARE CRYOPRECIPITATE: Unit division: 0

## 2020-04-24 LAB — PREPARE PLATELET PHERESIS
Unit division: 0
Unit division: 0

## 2020-04-24 LAB — BPAM CRYOPRECIPITATE
Blood Product Expiration Date: 202204202015
ISSUE DATE / TIME: 202204201431
Unit Type and Rh: 6200

## 2020-04-24 LAB — COOXEMETRY PANEL
Carboxyhemoglobin: 1.2 % (ref 0.5–1.5)
Methemoglobin: 1.9 % — ABNORMAL HIGH (ref 0.0–1.5)
O2 Saturation: 69.6 %
Total hemoglobin: 9.3 g/dL — ABNORMAL LOW (ref 12.0–16.0)

## 2020-04-24 LAB — MAGNESIUM
Magnesium: 2.5 mg/dL — ABNORMAL HIGH (ref 1.7–2.4)
Magnesium: 2.4 mg/dL (ref 1.7–2.4)

## 2020-04-24 MED ORDER — FAMOTIDINE IN NACL 20-0.9 MG/50ML-% IV SOLN
INTRAVENOUS | Status: AC
  Administered 2020-04-24: 20 mg via INTRAVENOUS
  Filled 2020-04-24: qty 50

## 2020-04-24 MED ORDER — FAMOTIDINE IN NACL 20-0.9 MG/50ML-% IV SOLN
20.0000 mg | Freq: Two times a day (BID) | INTRAVENOUS | Status: AC
  Administered 2020-04-24: 20 mg via INTRAVENOUS
  Filled 2020-04-24: qty 50

## 2020-04-24 MED ORDER — COLCHICINE 0.3 MG HALF TABLET
0.3000 mg | ORAL_TABLET | Freq: Two times a day (BID) | ORAL | Status: DC
  Administered 2020-04-24 – 2020-04-29 (×10): 0.3 mg via ORAL
  Filled 2020-04-24 (×11): qty 1

## 2020-04-24 MED ORDER — NICARDIPINE HCL IN NACL 20-0.86 MG/200ML-% IV SOLN
3.0000 mg/h | INTRAVENOUS | Status: DC
  Administered 2020-04-25: 15 mg/h via INTRAVENOUS
  Administered 2020-04-25: 5 mg/h via INTRAVENOUS
  Administered 2020-04-25 (×2): 7.5 mg/h via INTRAVENOUS
  Administered 2020-04-25: 15 mg/h via INTRAVENOUS
  Administered 2020-04-25: 7.5 mg/h via INTRAVENOUS
  Administered 2020-04-25: 3 mg/h via INTRAVENOUS
  Administered 2020-04-27: 10 mg/h via INTRAVENOUS
  Administered 2020-04-27: 5 mg/h via INTRAVENOUS
  Filled 2020-04-24 (×12): qty 200

## 2020-04-24 MED ORDER — KETOROLAC TROMETHAMINE 15 MG/ML IJ SOLN
7.5000 mg | Freq: Four times a day (QID) | INTRAMUSCULAR | Status: AC
  Administered 2020-04-24 – 2020-04-25 (×5): 7.5 mg via INTRAVENOUS
  Filled 2020-04-24 (×5): qty 1

## 2020-04-24 MED ORDER — LEVALBUTEROL HCL 0.63 MG/3ML IN NEBU
0.6300 mg | INHALATION_SOLUTION | RESPIRATORY_TRACT | Status: DC | PRN
  Administered 2020-04-25: 0.63 mg via RESPIRATORY_TRACT
  Filled 2020-04-24: qty 3

## 2020-04-24 MED FILL — Heparin Sodium (Porcine) Inj 1000 Unit/ML: INTRAMUSCULAR | Qty: 30 | Status: AC

## 2020-04-24 MED FILL — Potassium Chloride Inj 2 mEq/ML: INTRAVENOUS | Qty: 40 | Status: AC

## 2020-04-24 MED FILL — Magnesium Sulfate Inj 50%: INTRAMUSCULAR | Qty: 10 | Status: AC

## 2020-04-24 NOTE — Procedures (Signed)
Extubation Procedure Note  Patient Details:   Name: Sabrina Swanson DOB: 12-26-1960 MRN: [id number]   Airway Documentation:    Vent end date: 04/24/20 Vent end time: 1358   Evaluation  O2 sats: stable throughout Complications: No apparent complications Patient did tolerate procedure well. Bilateral Breath Sounds: Clear,Diminished   Yes   Patient extubated per rapid wean protocol. Positive cuff leak. No stridor noted. NIF -40, VC 1200. Sats dropped to 86% on 6L. Patient was placed on 10L salter with sats at 92%. RN at bedside.   [name] 04/24/2020, 2:06 PM

## 2020-04-24 NOTE — Anesthesia Postprocedure Evaluation (Signed)
Anesthesia Post Note  Patient: Sabrina Swanson  Procedure(s) Performed: CORONARY ARTERY BYPASS GRAFTING (CABG), ON PUMP, TIMES THREE, USING LEFT INTERNAL MAMMARY ARTERY AND ENDOSCOPICALLY HARVESTED RIGHT GREATER SAPHENOUS VEIN (N/A Chest) INDOCYANINE GREEN FLUORESCENCE IMAGING (ICG) (N/A ) TRANSESOPHAGEAL ECHOCARDIOGRAM (TEE) (N/A )     Patient location during evaluation: SICU Anesthesia Type: General Level of consciousness: awake and alert, patient cooperative and oriented Pain management: pain level controlled Vital Signs Assessment: post-procedure vital signs reviewed and stable Respiratory status: nonlabored ventilation, spontaneous breathing, respiratory function stable and patient connected to nasal cannula oxygen (extubated today) Cardiovascular status: stable (weaning milrinone) Postop Assessment: no apparent nausea or vomiting Anesthetic complications: no Comments: Pt doing well POD 1   No complications documented.  Last Vitals:  Vitals:   04/24/20 1800 04/24/20 1900  BP: 122/83   Pulse: 88 91  Resp: (!) 33 (!) 31  Temp: 37.4 C 37.5 C  SpO2: 94% 95%    Last Pain:  Vitals:   04/24/20 1600  TempSrc: Core  PainSc:                  Wyland Rastetter,E. Rayshell Goecke

## 2020-04-24 NOTE — Progress Notes (Addendum)
[facility] DAILY ICU PROGRESS NOTE                   [employer]            [place] [address]          [phone]   1 Day Post-Op Procedure(s) (LRB): CORONARY ARTERY BYPASS GRAFTING (CABG), ON PUMP, TIMES THREE, USING LEFT INTERNAL MAMMARY ARTERY AND ENDOSCOPICALLY HARVESTED RIGHT GREATER SAPHENOUS VEIN (N/A) INDOCYANINE GREEN FLUORESCENCE IMAGING (ICG) (N/A) TRANSESOPHAGEAL ECHOCARDIOGRAM (TEE) (N/A)  Total Length of Stay:  LOS: 2 days   Subjective: Sedated on vent, per nursing  Moves all extres/follows simple commands  Objective: Vital signs in last 24 hours: Temp:  [96.8 F (36 C)-99.1 F (37.3 C)] 98.6 F (37 C) (04/21 0800) Pulse Rate:  [79-97] 90 (04/21 0800) Cardiac Rhythm: Atrial paced (04/21 0400) Resp:  [11-49] 22 (04/21 0800) BP: (101-142)/(58-116) 101/63 (04/21 0800) SpO2:  [92 %-98 %] 93 % (04/21 0800) Arterial Line BP: (88-162)/(49-70) 88/70 (04/21 0800) FiO2 (%):  [50 %-60 %] 50 % (04/21 0722) Weight:  [84.9 kg] 84.9 kg (04/21 0500)  Filed Weights   04/22/20 0529 04/23/20 0553 04/24/20 0500  Weight: 74.8 kg 75 kg 84.9 kg    Weight change: 9.875 kg   Hemodynamic parameters for last 24 hours: PAP: (16-35)/(6-21) 22/11 CVP:  [7 mmHg-21 mmHg] 21 mmHg PCWP:  [13 mmHg] 13 mmHg CO:  [2.6 L/min-6.3 L/min] 4.6 L/min CI:  [1.4 L/min/m2-3.5 L/min/m2] 2.6 L/min/m2  Intake/Output from previous day: 04/20 0701 - 04/21 0700 In: 8881.3 [I.V.:4457.4; OHYWV:3710; IV Piggyback:1677.9] Out: 6269 [Urine:3180; Emesis/NG output:50; Blood:2203; Chest Tube:660]    Vent Mode: SIMV;PSV FiO2 (%):  [50 %-60 %] 50 % Set Rate:  [12 bmp-18 bmp] 18 bmp Vt Set:  [430 mL] 430 mL PEEP:  [5 cmH20] 5 cmH20 Pressure Support:  [10 cmH20] 10 cmH20 Plateau Pressure:  [20 SWN46-27 cmH20] 21 cmH20    Intake/Output this shift: No intake/output data recorded.  Current Meds: Scheduled Meds: . sodium chloride   Intravenous Once  . sodium chloride   Intravenous Once   . sodium chloride   Intravenous Once  . aspirin EC  325 mg Oral Daily   Or  . aspirin  324 mg Per Tube Daily  . atorvastatin  80 mg Oral Daily  . bisacodyl  10 mg Oral Daily   Or  . bisacodyl  10 mg Rectal Daily  . chlorhexidine gluconate (MEDLINE KIT)  15 mL Mouth Rinse BID  . Chlorhexidine Gluconate Cloth  6 each Topical Daily  . docusate sodium  200 mg Oral Daily  . levalbuterol  0.63 mg Nebulization Q6H  . mouth rinse  15 mL Mouth Rinse 10 times per day  . metoprolol tartrate  12.5 mg Oral BID   Or  . metoprolol tartrate  12.5 mg Per Tube BID  . [START ON 04/25/2020] pantoprazole  40 mg Oral Daily  . sodium chloride flush  3 mL Intravenous Q12H   Continuous Infusions: . sodium chloride 250 mL (04/24/20 0548)  . sodium chloride    . cefUROXime (ZINACEF)  IV 1.5 g (04/24/20 0844)  . dexmedetomidine (PRECEDEX) IV infusion 0.7 mcg/kg/hr (04/24/20 0700)  . electrolyte-A 75 mL/hr at 04/24/20 0744  . epinephrine 1 mcg/min (04/24/20 0700)  . insulin 2.6 mL/hr at 04/24/20 0700  . lactated ringers    . lactated ringers    . lactated ringers 20 mL/hr at 04/24/20 0700  . milrinone 0.25 mcg/kg/min (  04/24/20 0700)  . nitroGLYCERIN 70 mcg/min (04/24/20 0700)  . phenylephrine (NEO-SYNEPHRINE) Adult infusion 0 mcg/min (04/23/20 1550)   PRN Meds:.dextrose, lactated ringers, metoprolol tartrate, midazolam, morphine injection, ondansetron (ZOFRAN) IV, oxyCODONE, sodium chloride flush, traMADol  General appearance: sedated Neurologic: grossly intact Heart: regular rate and rhythm Lungs: dim in bases Abdomen: soft, non distended Extremities: no significant edema Wound: dressings CDI  Lab Results: CBC: Recent Labs    04/23/20 2200 04/24/20 0454 04/24/20 0500  WBC 12.0*  --  13.0*  HGB 10.8* 10.2* 10.5*  HCT 32.2* 30.0* 31.2*  PLT 115*  --  112*   BMET:  Recent Labs    04/23/20 2200 04/24/20 0454 04/24/20 0500  NA 141 144 142  K 4.6 4.0 3.9  CL 110  --  111  CO2 24  --   25  GLUCOSE 168*  --  129*  BUN 10  --  10  CREATININE 1.03*  --  0.99  CALCIUM 7.6*  --  7.7*    CMET: Lab Results  Component Value Date   WBC 13.0 (H) 04/24/2020   HGB 10.5 (L) 04/24/2020   HCT 31.2 (L) 04/24/2020   PLT 112 (L) 04/24/2020   GLUCOSE 129 (H) 04/24/2020   ALT 63 (H) 04/23/2020   AST 127 (H) 04/23/2020   NA 142 04/24/2020   K 3.9 04/24/2020   CL 111 04/24/2020   CREATININE 0.99 04/24/2020   BUN 10 04/24/2020   CO2 25 04/24/2020   INR 1.6 (H) 04/23/2020   HGBA1C 6.9 (H) 04/23/2020      PT/INR:  Recent Labs    04/23/20 1609  LABPROT 18.9*  INR 1.6*   Radiology: DG Chest 1 View  Result Date: 04/23/2020 CLINICAL DATA:  Follow-up CABG EXAM: CHEST  1 VIEW COMPARISON:  Earlier same day. FINDINGS: Endotracheal tube tip 6 cm above the carina. Nasogastric tube enters the stomach. Bilateral chest tubes in place. Swan-Ganz catheter in place with tip in the main pulmonary artery. No pneumothorax. Mild atelectasis in the left lower lobe and right mid lung. IMPRESSION: Lines and tubes well positioned. No pneumothorax. Mild atelectasis in the left lower lobe and right mid lung. Electronically Signed   By: Nelson Chimes M.D.   On: 04/23/2020 18:20   DG Chest Port 1 View  Result Date: 04/24/2020 CLINICAL DATA:  Intubation.  Open-heart surgery. EXAM: PORTABLE CHEST 1 VIEW COMPARISON:  04/23/2020. FINDINGS: Endotracheal tube, NG tube, Swan-Ganz catheter, bilateral chest tubes in stable position. Prior CABG. Stable cardiomegaly. Low lung volumes. Persistent left base and right mid lung atelectatic changes. No pneumothorax. IMPRESSION: 1.  Lines and tubes in stable position. 2.  Prior CABG.  Stable cardiomegaly. 3. Low lung volumes with persistent left base and right mid lung atelectatic changes. Electronically Signed   By: Marcello Moores  Register   On: 04/24/2020 07:51   DG Chest Port 1 View  Result Date: 04/23/2020 CLINICAL DATA:  Post CABG EXAM: PORTABLE CHEST 1 VIEW COMPARISON:   04/22/2020 FINDINGS: Changes of CABG. Bilateral chest tubes in place. No pneumothorax. Endotracheal tube is 5 cm above the carina. Swan-Ganz catheter in the main pulmonary artery. Mild vascular congestion and bibasilar atelectasis. IMPRESSION: Postoperative changes from CABG.  No pneumothorax. Electronically Signed   By: Rolm Baptise M.D.   On: 04/23/2020 16:37     Assessment/Plan: S/P Procedure(s) (LRB): CORONARY ARTERY BYPASS GRAFTING (CABG), ON PUMP, TIMES THREE, USING LEFT INTERNAL MAMMARY ARTERY AND ENDOSCOPICALLY HARVESTED RIGHT GREATER SAPHENOUS VEIN (N/A) INDOCYANINE GREEN FLUORESCENCE  IMAGING (ICG) (N/A) TRANSESOPHAGEAL ECHOCARDIOGRAM (TEE) (N/A)   1 Tmax 99.1, BP 251-898 systolic. CVP 21, IAB just removed - on milrinone, nitro,and epi, CI 2.6. Weaning NO off this am with hopes to extubate by the afternoon 2 on vent, precedex- ABG looks good, Co-Ox 70 3 expected ABLA - minor , monitor 4 normal renal fxn, excellent UOP 5 Mg++ and K+ ok 6 minor leukocytosis, SIRS- monitor clinically 7 thrombocytopenia- rel minor- monitor clinically 8 BS well controled on insulin gtt 9 CT 660 cc post op- leave for now 10 minor NGT drainage- remove after extubation 11 CXR  Low lung volumes with persistent left base and right mid lung atelectatic changes.   Sabrina Giovanni PA-C Pager [phone] 04/24/2020 8:51 AM   Pt seen and examined; agree with documentation. IABP removed this am; plan iNO wean and extubation. Continue to resuscitate gently. Harlynn Kimbell Z. Orvan Seen, [place]

## 2020-04-24 NOTE — Hospital Course (Addendum)
  History of Present Illness:     This is a 60 year old female with a lengthy past medical history, which includes the following cardiac risk factors essential hypertension, COPD, tobacco abuse, diabetes mellitus, hyperlipidemia who had chest pain for about 3 days earlier this month. She denies shortness of breath, nausea, diaphoresis, syncope. At family's urging, she presented to [place]. Ultimately, she was discharged and follow up was arranged with Dr. Geraldo Pitter. A nuclear stress test was arranged. She was seen by Dr. Harriet Masson on 04/13 to discuss the results of her nuclear stress test, as Dr. Geraldo Pitter was unavailable (on vacation). Test result revealed evidence of ischemia. A cardiac catheterization was then arranged and this was done by Dr. Claiborne Billings . Results showed severe multivessel coronary artery disease (please see specific report below). Dr. Orvan Seen was consulted for consideration of coronary artery disease.   Hospital course:  Dr. Orvan Seen evaluated the patient and all relevant studies and recommended proceeding with CABG.  The patient remained medically stable and on 04/23/2020 he was taken the operating room at which time she underwent coronary artery bypass grafting x3.  The patient tolerated the procedure well and was transported to the surgical intensive care unit in stable condition.  It is noted there was a presumed protamine reaction requiring initiation of inhaled nitric oxide as well as multiple pressors and placement of an intra-aortic balloon pump.  Postoperative hospital course:  The patient's hemodynamics stabilized and on postoperative day #1 the intra-aortic balloon pump was removed.  She remained on milrinone and epinephrine as well as nitroglycerin with cardiac index of 2.6.  Cooximetry was measured at 70 and plans were to proceed with weaning as able.  She remained on full ventilator support with plans to wean nitric oxide and proceeding towards extubation over time.  This  was able to be accomplished on the afternoon of postoperative day #1.  She has required significant oxygen supplementation early postoperatively since extubation including BiPAP.  She also had some hypertension and has been very difficult to control and Cardene drip was added and continued for a few days to assist with this. She was diuresed  with appropriate response.  On postop day 5 she was weaned off all intravenous pressors and blood pressure support.  She does continue to have significant oxygen requirement and we have requested pulmonary medicine to assist with ongoing management.  She remains on vancomycin and meropenem for possible pneumonia.  Additionally, we have asked physical therapy to assist with her mobilization and ongoing rehab.  She has done very well in terms of her diuresis and kidney function remains in the normal range.  We have reduced the Lasix.  Her anemia remains quite stable and leukocytosis has resolved.  Current chest x-ray does show improved overall aeration with left evidence of CHF picture.

## 2020-04-25 ENCOUNTER — Inpatient Hospital Stay (HOSPITAL_COMMUNITY): Payer: Medicare Other

## 2020-04-25 LAB — POCT I-STAT 7, (LYTES, BLD GAS, ICA,H+H)
Acid-Base Excess: 0 mmol/L (ref 0.0–2.0)
Acid-base deficit: 3 mmol/L — ABNORMAL HIGH (ref 0.0–2.0)
Acid-base deficit: 3 mmol/L — ABNORMAL HIGH (ref 0.0–2.0)
Acid-base deficit: 1 mmol/L (ref 0.0–2.0)
Acid-base deficit: 2 mmol/L (ref 0.0–2.0)
Bicarbonate: 23.3 mmol/L (ref 20.0–28.0)
Bicarbonate: 23.2 mmol/L (ref 20.0–28.0)
Bicarbonate: 24 mmol/L (ref 20.0–28.0)
Bicarbonate: 23.1 mmol/L (ref 20.0–28.0)
Bicarbonate: 22.8 mmol/L (ref 20.0–28.0)
Calcium, Ion: 1.11 mmol/L — ABNORMAL LOW (ref 1.15–1.40)
Calcium, Ion: 1.12 mmol/L — ABNORMAL LOW (ref 1.15–1.40)
Calcium, Ion: 1.14 mmol/L — ABNORMAL LOW (ref 1.15–1.40)
Calcium, Ion: 1.11 mmol/L — ABNORMAL LOW (ref 1.15–1.40)
Calcium, Ion: 1.1 mmol/L — ABNORMAL LOW (ref 1.15–1.40)
HCT: 29 % — ABNORMAL LOW (ref 36.0–46.0)
HCT: 29 % — ABNORMAL LOW (ref 36.0–46.0)
HCT: 28 % — ABNORMAL LOW (ref 36.0–46.0)
HCT: 30 % — ABNORMAL LOW (ref 36.0–46.0)
HCT: 29 % — ABNORMAL LOW (ref 36.0–46.0)
Hemoglobin: 9.9 g/dL — ABNORMAL LOW (ref 12.0–15.0)
Hemoglobin: 9.9 g/dL — ABNORMAL LOW (ref 12.0–15.0)
Hemoglobin: 9.5 g/dL — ABNORMAL LOW (ref 12.0–15.0)
Hemoglobin: 10.2 g/dL — ABNORMAL LOW (ref 12.0–15.0)
Hemoglobin: 9.9 g/dL — ABNORMAL LOW (ref 12.0–15.0)
O2 Saturation: 94 %
O2 Saturation: 93 %
O2 Saturation: 89 %
O2 Saturation: 91 %
O2 Saturation: 96 %
Patient temperature: 37.4
Patient temperature: 37.1
Patient temperature: 37.2
Patient temperature: 37
Patient temperature: 98.6
Potassium: 3.9 mmol/L (ref 3.5–5.1)
Potassium: 4 mmol/L (ref 3.5–5.1)
Potassium: 3.8 mmol/L (ref 3.5–5.1)
Potassium: 3.9 mmol/L (ref 3.5–5.1)
Potassium: 3.9 mmol/L (ref 3.5–5.1)
Sodium: 144 mmol/L (ref 135–145)
Sodium: 144 mmol/L (ref 135–145)
Sodium: 142 mmol/L (ref 135–145)
Sodium: 143 mmol/L (ref 135–145)
Sodium: 143 mmol/L (ref 135–145)
TCO2: 25 mmol/L (ref 22–32)
TCO2: 24 mmol/L (ref 22–32)
TCO2: 25 mmol/L (ref 22–32)
TCO2: 24 mmol/L (ref 22–32)
TCO2: 24 mmol/L (ref 22–32)
pCO2 arterial: 44.4 mmHg (ref 32.0–48.0)
pCO2 arterial: 44.4 mmHg (ref 32.0–48.0)
pCO2 arterial: 38 mmHg (ref 32.0–48.0)
pCO2 arterial: 36.7 mmHg (ref 32.0–48.0)
pCO2 arterial: 36.4 mmHg (ref 32.0–48.0)
pH, Arterial: 7.329 — ABNORMAL LOW (ref 7.350–7.450)
pH, Arterial: 7.326 — ABNORMAL LOW (ref 7.350–7.450)
pH, Arterial: 7.41 (ref 7.350–7.450)
pH, Arterial: 7.407 (ref 7.350–7.450)
pH, Arterial: 7.404 (ref 7.350–7.450)
pO2, Arterial: 80 mmHg — ABNORMAL LOW (ref 83.0–108.0)
pO2, Arterial: 74 mmHg — ABNORMAL LOW (ref 83.0–108.0)
pO2, Arterial: 56 mmHg — ABNORMAL LOW (ref 83.0–108.0)
pO2, Arterial: 61 mmHg — ABNORMAL LOW (ref 83.0–108.0)
pO2, Arterial: 78 mmHg — ABNORMAL LOW (ref 83.0–108.0)

## 2020-04-25 LAB — BASIC METABOLIC PANEL
Anion gap: 6 (ref 5–15)
BUN: 12 mg/dL (ref 6–20)
CO2: 24 mmol/L (ref 22–32)
Calcium: 7.7 mg/dL — ABNORMAL LOW (ref 8.9–10.3)
Chloride: 109 mmol/L (ref 98–111)
Creatinine, Ser: 0.83 mg/dL (ref 0.44–1.00)
GFR, Estimated: >60 mL/min (ref >60)
Glucose, Bld: 134 mg/dL — ABNORMAL HIGH (ref 70–99)
Potassium: 3.8 mmol/L (ref 3.5–5.1)
Sodium: 139 mmol/L (ref 135–145)

## 2020-04-25 LAB — GLUCOSE, CAPILLARY
Glucose-Capillary: 115 mg/dL — ABNORMAL HIGH (ref 70–99)
Glucose-Capillary: 128 mg/dL — ABNORMAL HIGH (ref 70–99)
Glucose-Capillary: 130 mg/dL — ABNORMAL HIGH (ref 70–99)
Glucose-Capillary: 137 mg/dL — ABNORMAL HIGH (ref 70–99)
Glucose-Capillary: 138 mg/dL — ABNORMAL HIGH (ref 70–99)
Glucose-Capillary: 140 mg/dL — ABNORMAL HIGH (ref 70–99)
Glucose-Capillary: 140 mg/dL — ABNORMAL HIGH (ref 70–99)
Glucose-Capillary: 141 mg/dL — ABNORMAL HIGH (ref 70–99)
Glucose-Capillary: 142 mg/dL — ABNORMAL HIGH (ref 70–99)
Glucose-Capillary: 145 mg/dL — ABNORMAL HIGH (ref 70–99)
Glucose-Capillary: 152 mg/dL — ABNORMAL HIGH (ref 70–99)
Glucose-Capillary: 152 mg/dL — ABNORMAL HIGH (ref 70–99)
Glucose-Capillary: 90 mg/dL (ref 70–99)

## 2020-04-25 LAB — COOXEMETRY PANEL
Carboxyhemoglobin: 0.7 % (ref 0.5–1.5)
Carboxyhemoglobin: 0.5 % (ref 0.5–1.5)
Methemoglobin: 1.2 % (ref 0.0–1.5)
Methemoglobin: 1 % (ref 0.0–1.5)
O2 Saturation: 59.4 %
O2 Saturation: 70.6 %
Total hemoglobin: 9.7 g/dL — ABNORMAL LOW (ref 12.0–16.0)
Total hemoglobin: 10.3 g/dL — ABNORMAL LOW (ref 12.0–16.0)

## 2020-04-25 LAB — CBC
HCT: 30.4 % — ABNORMAL LOW (ref 36.0–46.0)
Hemoglobin: 10.1 g/dL — ABNORMAL LOW (ref 12.0–15.0)
MCH: 30.4 pg (ref 26.0–34.0)
MCHC: 33.2 g/dL (ref 30.0–36.0)
MCV: 91.6 fL (ref 80.0–100.0)
Platelets: 102 10*3/uL — ABNORMAL LOW (ref 150–400)
RBC: 3.32 MIL/uL — ABNORMAL LOW (ref 3.87–5.11)
RDW: 16 % — ABNORMAL HIGH (ref 11.5–15.5)
WBC: 14.4 10*3/uL — ABNORMAL HIGH (ref 4.0–10.5)
nRBC: 0 % (ref 0.0–0.2)

## 2020-04-25 MED ORDER — INSULIN ASPART 100 UNIT/ML ~~LOC~~ SOLN
0.0000 [IU] | SUBCUTANEOUS | Status: DC
  Administered 2020-04-25 – 2020-04-28 (×14): 2 [IU] via SUBCUTANEOUS

## 2020-04-25 MED ORDER — FUROSEMIDE 10 MG/ML IJ SOLN: 40.0000 mg | Freq: Two times a day (BID) | INTRAMUSCULAR | Status: DC

## 2020-04-25 MED ORDER — FUROSEMIDE 10 MG/ML IJ SOLN
4.0000 mg/h | INTRAVENOUS | Status: DC
  Administered 2020-04-25: 4 mg/h via INTRAVENOUS
  Filled 2020-04-25: qty 20

## 2020-04-25 MED ORDER — ASPIRIN EC 81 MG PO TBEC
81.0000 mg | DELAYED_RELEASE_TABLET | Freq: Every day | ORAL | Status: DC
  Administered 2020-04-25 – 2020-05-01 (×7): 81 mg via ORAL
  Filled 2020-04-25 (×7): qty 1

## 2020-04-25 MED ORDER — POTASSIUM CHLORIDE CRYS ER 20 MEQ PO TBCR
20.0000 meq | EXTENDED_RELEASE_TABLET | ORAL | Status: AC
  Administered 2020-04-25 (×3): 20 meq via ORAL
  Filled 2020-04-25 (×3): qty 1

## 2020-04-25 MED ORDER — CLOPIDOGREL BISULFATE 75 MG PO TABS
75.0000 mg | ORAL_TABLET | Freq: Every day | ORAL | Status: DC
  Administered 2020-04-25 – 2020-05-01 (×7): 75 mg via ORAL
  Filled 2020-04-25 (×7): qty 1

## 2020-04-25 MED ORDER — MILRINONE LACTATE IN DEXTROSE 20-5 MG/100ML-% IV SOLN
0.1250 ug/kg/min | INTRAVENOUS | Status: DC
  Administered 2020-04-25: 0.125 ug/kg/min via INTRAVENOUS
  Filled 2020-04-25: qty 100

## 2020-04-25 MED ORDER — FUROSEMIDE 10 MG/ML IJ SOLN
40.0000 mg | Freq: Once | INTRAMUSCULAR | Status: AC
  Administered 2020-04-25: 40 mg via INTRAVENOUS
  Filled 2020-04-25: qty 4

## 2020-04-25 MED FILL — Heparin Sodium (Porcine) Inj 1000 Unit/ML: INTRAMUSCULAR | Qty: 10 | Status: AC

## 2020-04-25 MED FILL — Mannitol IV Soln 20%: INTRAVENOUS | Qty: 500 | Status: AC

## 2020-04-25 MED FILL — Sodium Chloride IV Soln 0.9%: INTRAVENOUS | Qty: 6000 | Status: AC

## 2020-04-25 MED FILL — Calcium Chloride Inj 10%: INTRAVENOUS | Qty: 10 | Status: AC

## 2020-04-25 MED FILL — Sodium Bicarbonate IV Soln 8.4%: INTRAVENOUS | Qty: 50 | Status: AC

## 2020-04-25 MED FILL — Electrolyte-R (PH 7.4) Solution: INTRAVENOUS | Qty: 4000 | Status: AC

## 2020-04-25 NOTE — Progress Notes (Signed)
Placed patient on bipap due to decreased Sp02 and Po2 levels

## 2020-04-25 NOTE — Progress Notes (Signed)
2 Days Post-Op Procedure(s) (LRB): CORONARY ARTERY BYPASS GRAFTING (CABG), ON PUMP, TIMES THREE, USING LEFT INTERNAL MAMMARY ARTERY AND ENDOSCOPICALLY HARVESTED RIGHT GREATER SAPHENOUS VEIN (N/A) INDOCYANINE GREEN FLUORESCENCE IMAGING (ICG) (N/A) TRANSESOPHAGEAL ECHOCARDIOGRAM (TEE) (N/A) Subjective: Short of breath  Objective: Vital signs in last 24 hours: Temp:  [98.6 F (37 C)-99.9 F (37.7 C)] 98.8 F (37.1 C) (04/22 0745) Pulse Rate:  [78-104] 87 (04/22 0745) Cardiac Rhythm: Normal sinus rhythm (04/22 0400) Resp:  [16-62] 23 (04/22 0745) BP: (98-155)/(64-103) 114/83 (04/22 0700) SpO2:  [86 %-98 %] 97 % (04/22 0745) Arterial Line BP: (107-206)/(57-84) 132/66 (04/22 0745) FiO2 (%):  [40 %-100 %] 100 % (04/22 0124)  Hemodynamic parameters for last 24 hours: PAP: (24-47)/(5-27) 36/20 CO:  [4.3 L/min-5.7 L/min] 5.7 L/min CI:  [2.4 L/min/m2-3.2 L/min/m2] 3.2 L/min/m2  Intake/Output from previous day: 04/21 0701 - 04/22 0700 In: 2914.8 [I.V.:2614.6; IV Piggyback:300.2] Out: 1890 [Urine:1160; Chest Tube:730] Intake/Output this shift: No intake/output data recorded.  General appearance: alert and cooperative Neurologic: intact Heart: regular rate and rhythm, S1, S2 normal, no murmur, click, rub or gallop Lungs: diminished breath sounds bibasilar Abdomen: mildly distended Extremities: edema 2+ Wound: dressed, dry  Lab Results: Recent Labs    04/24/20 1601 04/25/20 0110 04/25/20 0214 04/25/20 0221 04/25/20 0627  WBC 12.8*  --  14.4*  --   --   HGB 9.9*   < > 10.1* 10.2* 9.9*  HCT 29.9*   < > 30.4* 30.0* 29.0*  PLT 98*  --  102*  --   --    < > = values in this interval not displayed.   BMET:  Recent Labs    04/24/20 1601 04/25/20 0110 04/25/20 0214 04/25/20 0221 04/25/20 0627  NA 141   < > 139 143 143  K 4.4   < > 3.8 3.9 3.9  CL 112*  --  109  --   --   CO2 24  --  24  --   --   GLUCOSE 117*  --  134*  --   --   BUN 11  --  12  --   --   CREATININE 0.88   --  0.83  --   --   CALCIUM 7.5*  --  7.7*  --   --    < > = values in this interval not displayed.    PT/INR:  Recent Labs    04/23/20 1609  LABPROT 18.9*  INR 1.6*   ABG    Component Value Date/Time   PHART 7.404 04/25/2020 0627   HCO3 22.8 04/25/2020 0627   TCO2 24 04/25/2020 0627   ACIDBASEDEF 2.0 04/25/2020 0627   O2SAT 96.0 04/25/2020 0627   CBG (last 3)  Recent Labs    04/25/20 0219 04/25/20 0356 04/25/20 0646  GLUCAP 141* 152* 152*    Assessment/Plan: S/P Procedure(s) (LRB): CORONARY ARTERY BYPASS GRAFTING (CABG), ON PUMP, TIMES THREE, USING LEFT INTERNAL MAMMARY ARTERY AND ENDOSCOPICALLY HARVESTED RIGHT GREATER SAPHENOUS VEIN (N/A) INDOCYANINE GREEN FLUORESCENCE IMAGING (ICG) (N/A) TRANSESOPHAGEAL ECHOCARDIOGRAM (TEE) (N/A) Mobilize,  Diuresis Remove PA cath oob to chair Titrate meds pulm toilet Pain control   LOS: 3 days    [name] 04/25/2020

## 2020-04-25 NOTE — Progress Notes (Signed)
   Pt has been hypertensive since the beginning of the shift. Systolic pressures on the aline started in the 140s and continued to elevate, they are now closer to 200. Atkins was paged at 2345 as the pt is maxed on nitro, and PO lopressor as well as IV were unsuccessful at lowering the BP.  Cardene was ordered as a replacement for nitro. Cardene was initiated and I'm titrating for a MAP goals of 80 per Atkins.  Pt desated at [place] to 84/85. She stated she felt like she was breathing fast and her RR was 26. Pt was on high flow nasal cannula at 15. Respiratory was paged and heated high flow was started. Pt has been on heated high flow for 20 mins and is still not above 92 therefore a gas was pulled. PO2 was 55. Pt was started on a nonrebreather mask with some improvement in O2 sat. PO2 improved to 61 but sats were still < 90 therefore we initiated bipap. Being on bipap for 3 hours raised her po2 to 78 and o2 sats have been > 92.

## 2020-04-25 NOTE — Progress Notes (Signed)
Patient ID: Sabrina Swanson, female   DOB: June 04, 1960, 60 y.o.   MRN: [id number]  TCTS Evening Rounds:   Hemodynamically stable on milrinone 0.125 On bipap all day. FiO2 increased to 70%.  Urine output good with lasix drip 4.  Chest tubes removed today.  CBC    Component Value Date/Time   WBC 14.4 (H) 04/25/2020 0214   RBC 3.32 (L) 04/25/2020 0214   HGB 9.9 (L) 04/25/2020 0627   HGB 14.4 04/16/2020 1120   HCT 29.0 (L) 04/25/2020 0627   HCT 43.1 04/16/2020 1120   PLT 102 (L) 04/25/2020 0214   PLT 235 04/16/2020 1120   MCV 91.6 04/25/2020 0214   MCV 89 04/16/2020 1120   MCH 30.4 04/25/2020 0214   MCHC 33.2 04/25/2020 0214   RDW 16.0 (H) 04/25/2020 0214   RDW 14.2 04/16/2020 1120   LYMPHSABS 2.9 04/16/2020 1120   MONOABS 0.4 05/10/2013 1700   EOSABS 0.0 04/16/2020 1120   BASOSABS 0.0 04/16/2020 1120     BMET    Component Value Date/Time   NA 143 04/25/2020 0627   NA 142 04/16/2020 1120   K 3.9 04/25/2020 0627   CL 109 04/25/2020 0214   CO2 24 04/25/2020 0214   GLUCOSE 134 (H) 04/25/2020 0214   BUN 12 04/25/2020 0214   BUN 10 04/16/2020 1120   CREATININE 0.83 04/25/2020 0214   CALCIUM 7.7 (L) 04/25/2020 0214   GFRNONAA >60 04/25/2020 0214   GFRAA >90 05/10/2013 1700     A/P:  Postop hypoxemia likely due to atelectasis and pulmonary edema on CXR. Continue diuresis, IS, bipap as needed.

## 2020-04-25 NOTE — Discharge Summary (Signed)
Physician Discharge Summary  Patient ID: Sabrina Swanson MRN: [id number] DOB/AGE: 60/15/1962 60 y.o.  Admit date: 04/22/2020 Discharge date: 05/01/2020  Admission Diagnoses:  Discharge Diagnoses:  Active Problems:   CAD (coronary artery disease), native coronary artery   S/P CABG x 3   Patient Active Problem List   Diagnosis Date Noted  . S/P CABG x 3 04/23/2020  . CAD (coronary artery disease), native coronary artery 04/22/2020  . Tobacco abuse 04/16/2020  . Abnormal stress test 04/16/2020  . Diabetes mellitus due to underlying condition with hyperosmolarity without coma, without long-term current use of insulin ([place]) 04/16/2020  . Mixed hyperlipidemia 04/16/2020  . High cholesterol   . Hypertension   . Angina pectoris ([place]) 11/13/2019  . Cardiac murmur 11/13/2019  . Continuous dependence on cigarette smoking 11/13/2019  . Back pain   . Diabetes mellitus without complication ([place])   . Essential hypertension 08/14/2019  . Arthritis of right acromioclavicular joint 11/27/2018  . Shoulder impingement, right 11/27/2018  . Tendinopathy of rotator cuff, right 11/27/2018  . Subacute vaginitis 07/18/2015  . Chalazion right upper eyelid 06/30/2015  . Unspecified cataract 06/30/2015  . Risk for falls 03/05/2015  . Hammer toe of right foot 10/23/2014  . Lump of skin 10/23/2014  . Numbness of toes 10/23/2014  . Pain in toes of both feet 10/23/2014  . Type 2 diabetes, controlled, with neuropathy ([place]) 10/21/2014  . Persistent lymphocytosis 05/25/2014  . Dyspareunia 03/04/2014  . Vertigo 06/14/2013  . Alopecia 05/16/2013  . COPD (chronic obstructive pulmonary disease) ([place]) 05/16/2013  . Tinnitus 03/21/2013  . Hypercholesterolemia 03/06/2013  . Tobacco use disorder 03/06/2013  . Acquired trigger finger 06/10/2012  . Carpal tunnel syndrome 06/10/2012  . Anxiety state 06/10/2012  . Benign neoplasm of colon 06/10/2012  . Depressive disorder 06/10/2012  . Dysthymia 06/10/2012  .  Encounter for long-term (current) use of other medications 06/10/2012  . Generalized anxiety disorder 06/10/2012  . Insomnia 06/10/2012  . DDD (degenerative disc disease), lumbosacral 06/10/2012  . Lumbosacral spondylosis 06/10/2012  . Migraine syndrome 06/10/2012  . Osteoarthrosis, generalized, involving multiple sites 06/10/2012  . Pain in soft tissues of limb 06/10/2012  . Patellar tendinitis 06/10/2012  . Plantar fascial fibromatosis 06/10/2012  . Sensorineural hearing loss 06/10/2012  . Vestibular dizziness 06/10/2012  . Bursitis disorder 05/11/2011    Discharged Condition: stable History of Present Illness:     This is a 60 year old female with a lengthy past medical history, which includes the following cardiac risk factors essential hypertension, COPD, tobacco abuse, diabetes mellitus, hyperlipidemia who had chest pain for about 3 days earlier this month. She denies shortness of breath, nausea, diaphoresis, syncope. At family's urging, she presented to [facility]. Ultimately, she was discharged and follow up was arranged with Dr. Geraldo Pitter. A nuclear stress test was arranged. She was seen by Dr. Harriet Masson on 04/13 to discuss the results of her nuclear stress test, as Dr. Geraldo Pitter was unavailable (on vacation). Test result revealed evidence of ischemia. A cardiac catheterization was then arranged and this was done by Dr. Claiborne Billings . Results showed severe multivessel coronary artery disease (please see specific report below). Dr. Orvan Seen was consulted for consideration of coronary artery disease.   Hospital course:  Dr. Orvan Seen evaluated the patient and all relevant studies and recommended proceeding with CABG.  The patient remained medically stable and on 04/23/2020 he was taken the operating room at which time she underwent coronary artery bypass grafting x3.  The patient tolerated the  procedure well and was transported to the surgical intensive care unit in stable condition.  It is noted  there was a presumed protamine reaction requiring initiation of inhaled nitric oxide as well as multiple pressors and placement of an intra-aortic balloon pump.  Postoperative Hospital Course:  The patient's hemodynamics stabilized and on postoperative day #1 the intra-aortic balloon pump was removed.  She remained on milrinone and epinephrine as well as nitroglycerin with cardiac index of 2.6.  Cooximetry was measured at 70 and plans were to proceed with weaning as able.  She remained on full ventilator support with plans to wean nitric oxide and proceeding towards extubation over time.  This was able to be accomplished on the afternoon of postoperative day #1.  She has required significant oxygen supplementation early postoperatively since extubation including BiPAP.  She also had some hypertension and has been very difficult to control and Cardene drip was added and continued for a few days to assist with this. She was diuresed  with appropriate response.  On postop day 5 she was weaned off all intravenous pressors and blood pressure support.  She does continue to have significant oxygen requirement and we have requested pulmonary medicine to assist with ongoing management.  She was started on vancomycin and meropenem for possible pneumonia. This was converted to oral Levequin at discharge.   Additionally, we  asked physical therapy to assist with her mobilization and ongoing rehab.  She  progressed very well in terms of her diuresis and kidney function remained in the normal range.  We reduced the Lasix.  Her anemia remained quite stable and leukocytosis  resolved.  Current chest x-ray does show improved overall aeration with a small left effusion and stable cardiomegaly.  The cardiology team followed the patient throughout the postoperative course and made adjustments to medications as required. They recommended adding Farxiga at discharge and this was done.  Ms. Delio was still requiring supplemental  O2 at discharge. A walk test was conducted and documented need for home O2 therapy. This was arranged prior to discharge.    Consults: pulmonary/intensive care  Significant Diagnostic Studies:   CLINICAL DATA:  History of open heart surgery.  EXAM: CHEST - 2 VIEW  COMPARISON:  April 29, 2020.  FINDINGS: Similar suspected left pleural effusion with left basilar consolidation. No visible pneumothorax. Similar enlargement of the cardiac silhouette with pericardial effusions noted on prior CT chest. CABG with median sternotomy.  IMPRESSION: 1. Similar suspected left pleural effusion with left basilar consolidation. 2. Similar enlarged cardiac silhouette.   Electronically Signed   By: Margaretha Sheffield MD   On: 05/01/2020 07:09  Treatments:  CARDIOTHORACIC SURGERY OPERATIVE NOTE  Date of Procedure:    04/23/2020  Preoperative Diagnosis:      Severe 3-vessel Coronary Artery Disease  Postoperative Diagnosis:    Same  Procedure:        Coronary Artery Bypass Grafting x 3              Left Internal Mammary Artery to Distal Left Anterior Descending Coronary Artery; Saphenous Vein Graft to Posterior Descending Coronary Artery; Saphenous Vein Graft to  Obtuse Marginal Branch of Left Circumflex Coronary Artery, Endoscopic Vein Harvest from right Thigh and Lower Leg Intra-aortic balloon pump insertion  Surgeon:        B. Murvin Natal, MD  Assistant:       Macarthur Critchley, PA-C  Anesthesia:    get  Operative Findings: ? Mildly reduced left ventricular systolic function ?  good quality left internal mammary artery conduit ? good quality saphenous vein conduit ? good quality target vessels for grafting   Discharge Exam: Blood pressure 126/89, pulse 93, temperature 97.6 F (36.4 C), temperature source Oral, resp. rate 19, height '5\' 4"'  (1.626 m), weight 72.4 kg, SpO2 95 %.   General appearance: alert, cooperative and no distress Heart: regular rate and  rhythm Lungs: dim left base Abdomen: benign Extremities: no edema Wound: incis healing well   Disposition:    Allergies as of 05/01/2020      Reactions   Acetaminophen Itching, Nausea Only      Medication List    STOP taking these medications   oxyCODONE 10 mg 12 hr tablet Commonly known as: OXYCONTIN Replaced by: oxyCODONE 5 MG immediate release tablet     TAKE these medications   albuterol 108 (90 Base) MCG/ACT inhaler Commonly known as: VENTOLIN HFA Inhale 2 puffs into the lungs every 4 (four) hours as needed for shortness of breath.   aspirin 81 MG EC tablet Take 81 mg by mouth daily.   atorvastatin 40 MG tablet Commonly known as: Lipitor Take 1 tablet (40 mg total) by mouth daily.   carvedilol 25 MG tablet Commonly known as: COREG Take 1 tablet (25 mg total) by mouth 2 (two) times daily with a meal.   clopidogrel 75 MG tablet Commonly known as: PLAVIX Take 1 tablet (75 mg total) by mouth daily.   dapagliflozin propanediol 10 MG Tabs tablet Commonly known as: Farxiga Take 1 tablet (10 mg total) by mouth daily before breakfast.   ergocalciferol 1.25 MG (50000 UT) capsule Commonly known as: VITAMIN D2 Take 50,000 Units by mouth every 7 (seven) days.   Fish Oil 1000 MG Caps Take 1,000 mg by mouth daily.   Ipratropium-Albuterol 20-100 MCG/ACT Aers respimat Commonly known as: COMBIVENT Inhale 1 puff into the lungs daily in the afternoon.   levofloxacin 500 MG tablet Commonly known as: LEVAQUIN Take 1 tablet (500 mg total) by mouth daily for 7 days.   losartan 50 MG tablet Commonly known as: COZAAR Take 1 tablet (50 mg total) by mouth daily. What changed:   medication strength  how much to take  when to take this   metFORMIN 500 MG 24 hr tablet Commonly known as: GLUCOPHAGE-XR Take 500 mg by mouth daily.   Narcan 4 MG/0.1ML Liqd nasal spray kit Generic drug: naloxone Place 4 mg into the nose once.   nitroGLYCERIN 0.4 MG SL tablet Commonly  known as: NITROSTAT Place 0.4 mg under the tongue every 5 (five) minutes as needed for chest pain.   oxyCODONE 5 MG immediate release tablet Commonly known as: Oxy IR/ROXICODONE Take 1 tablet (5 mg total) by mouth every 6 (six) hours as needed for up to 7 days for severe pain. Replaces: oxyCODONE 10 mg 12 hr tablet   vitamin B-12 1000 MCG tablet Commonly known as: CYANOCOBALAMIN Take 1,000 mcg by mouth daily.       Follow-up Information    [name], MD. Go on 05/06/2020.   Specialty: Cardiothoracic Surgery Why: Your appointment is at 3:15pm.  Contact information: [address] [place] [address] [phone]        Berniece Salines, DO. Go on 05/21/2020.   Specialty: Cardiology Why: Your appointment is at 9:20am.  Contact information: [place] [address] [place] [address] [phone]              The patient  has been discharged on:   1.Beta Blocker:  Yes [x   ]                              No   [   ]                              If No, reason:  2.Ace Inhibitor/ARB: Yes [ x  ]                                     No  [    ]                                     If No, reason:  3.Statin:   Yes [ x  ]                  No  [   ]                  If No, reason:  4.Ecasa:  Yes  [ x  ]                  No   [   ]                  If No, reason: Signed: [name], PA-C 05/01/2020, 9:47 AM

## 2020-04-25 NOTE — Discharge Instructions (Signed)
TCTS office number [phone]   Endoscopic Saphenous Vein Harvesting, Care After This sheet gives you information about how to care for yourself after your procedure. Your health care provider may also give you more specific instructions. If you have problems or questions, contact your health care provider. What can I expect after the procedure? After the procedure, it is common to have:  Pain.  Bruising.  Swelling.  Numbness. Follow these instructions at home: Incision care  Follow instructions from your health care provider about how to take care of your incisions. Make sure you: ? Wash your hands with soap and water before and after you change your bandages (dressings). If soap and water are not available, use hand sanitizer. ? Change your dressings as told by your health care provider. ? Leave stitches (sutures), skin glue, or adhesive strips in place. These skin closures may need to stay in place for 2 weeks or longer. If adhesive strip edges start to loosen and curl up, you may trim the loose edges. Do not remove adhesive strips completely unless your health care provider tells you to do that.  Check your incision areas every day for signs of infection. Check for: ? More redness, swelling, or pain. ? Fluid or blood. ? Warmth. ? Pus or a bad smell.   Medicines  Take over-the-counter and prescription medicines only as told by your health care provider.  Ask your health care provider if the medicine prescribed to you requires you to avoid driving or using heavy machinery. General instructions  Raise (elevate) your legs above the level of your heart while you are sitting or lying down.  Avoid crossing your legs.  Avoid sitting for long periods of time. Change positions every 30 minutes.  Do any exercises your health care providers have given you. These may include deep breathing, coughing, and walking exercises.  Do not take baths, swim, or use a hot tub until your  health care provider approves. Ask your health care provider if you may take showers. You may only be allowed to take sponge baths.  Wear compression stockings as told by your health care provider. These stockings help to prevent blood clots and reduce swelling in your legs.  Keep all follow-up visits as told by your health care provider. This is important. Contact a health care provider if:  Medicine does not help your pain.  Your pain gets worse.  You have new leg bruises or your leg bruises get bigger.  Your leg feels numb.  You have more redness, swelling, or pain around your incision.  You have fluid or blood coming from your incision.  Your incision feels warm to the touch.  You have pus or a bad smell coming from your incision.  You have a fever. Get help right away if:  Your pain is severe.  You develop pain, tenderness, warmth, redness, or swelling in any part of your leg.  You have chest pain.  You have trouble breathing. Summary  Raise (elevate) your legs above the level of your heart while you are sitting or lying down.  Wear compression stockings as told by your health care provider.  Make sure you know which symptoms should prompt you to contact your health care provider.  Keep all follow-up visits as told by your health care provider. This information is not intended to replace advice given to you by your health care provider. Make sure you discuss any questions you have with your health care provider. Document Revised:  11/28/2017 Document Reviewed: 11/28/2017 Elsevier Patient Education  [place]. Coronary Artery Bypass Grafting, Care After This sheet gives you information about how to care for yourself after your procedure. Your doctor may also give you more specific instructions. If you have problems or questions, call your doctor. What can I expect after the procedure? After the procedure, it is common to:  Feel sick to your stomach  (nauseous).  Not want to eat as much as normal (lack of appetite).  Have trouble pooping (constipation).  Have weakness and tiredness (fatigue).  Feel sad (depressed) or grouchy (irritable).  Have pain or discomfort around the cuts from surgery (incisions). Follow these instructions at home: Medicines  Take over-the-counter and prescription medicines only as told by your doctor. Do not stop taking medicines or start any new medicines unless your doctor says it is okay.  If you were prescribed an antibiotic medicine, take it as told by your doctor. Do not stop taking the antibiotic even if you start to feel better. Incision care  Follow instructions from your doctor about how to take care of your cuts from surgery. Make sure you: ? Wash your hands with soap and water before and after you change your bandage (dressing). If you cannot use soap and water, use hand sanitizer. ? Change your bandage as told by your doctor. ? Leave stitches (sutures), skin glue, or skin tape (adhesive) strips in place. They may need to stay in place for 2 weeks or longer. If tape strips get loose and curl up, you may trim the loose edges. Do not remove tape strips completely unless your doctor says it is okay.  Make sure the surgery cuts are clean, dry, and protected.  Check your cut areas every day for signs of infection. Check for: ? More redness, swelling, or pain. ? More fluid or blood. ? Warmth. ? Pus or a bad smell.  If cuts were made in your legs: ? Avoid crossing your legs. ? Avoid sitting for long periods of time. Change positions every 30 minutes. ? Raise (elevate) your legs when you are sitting.   Bathing  Do not take baths, swim, or use a hot tub until your doctor says it is okay.  Only take sponge baths. Pat the surgery cuts dry. Do not rub the cuts to dry.  Ask your doctor when you can shower. Eating and drinking  Eat foods that are high in fiber, such as beans, nuts, whole grains,  and raw fruits and vegetables. Any meats you eat should be lean cut. Avoid canned, processed, and fried foods. This can help prevent trouble pooping. This is also a part of a heart-healthy diet.  Drink enough fluid to keep your pee (urine) pale yellow.  Do not drink alcohol until you are fully recovered. Ask your doctor when it is safe to drink alcohol.   Activity  Rest and limit your activity as told by your doctor. You may be told to: ? Stop any activity right away if you have chest pain, shortness of breath, irregular heartbeats, or dizziness. Get help right away if you have any of these symptoms. ? Move around often for short periods or take short walks as told by your doctor. Slowly increase your activities. ? Avoid lifting, pushing, or pulling anything that is heavier than 10 lb (4.5 kg) for at least 6 weeks or as told by your doctor.  Do physical therapy or a cardiac rehab (cardiac rehabilitation) program as told by your doctor. ?  Physical therapy involves doing exercises to maintain movement and build strength and endurance. ? A cardiac rehab program includes:  Exercise training.  Education.  Counseling.  Do not drive until your doctor says it is okay.  Ask your doctor when you can go back to work.  Ask your doctor when you can be sexually active. General instructions  Do not drive or use heavy machinery while taking prescription pain medicine.  Do not use any products that contain nicotine or tobacco. These include cigarettes, e-cigarettes, and chewing tobacco. If you need help quitting, ask your doctor.  Take 2-3 deep breaths every few hours during the day while you get better. This helps expand your lungs and prevent problems.  If you were given a device called an incentive spirometer, use it several times a day to practice deep breathing. Support your chest with a pillow or your arms when you take deep breaths or cough.  Wear compression stockings as told by your  doctor.  Weigh yourself every day. This helps to see if your body is holding (retaining) fluid that may make your heart and lungs work harder.  Keep all follow-up visits as told by your doctor. This is important. Contact a doctor if:  You have more redness, swelling, or pain around any cut.  You have more fluid or blood coming from any cut.  Any cut feels warm to the touch.  You have pus or a bad smell coming from any cut.  You have a fever.  You have swelling in your ankles or legs.  You have pain in your legs.  You gain 2 lb (0.9 kg) or more a day.  You feel sick to your stomach or you throw up (vomit).  You have watery poop (diarrhea). Get help right away if:  You have chest pain that goes to your jaw or arms.  You are short of breath.  You have a fast or irregular heartbeat.  You notice a "clicking" in your breastbone (sternum) when you move.  You have any signs of a stroke. "BE FAST" is an easy way to remember the main warning signs: ? B - Balance. Signs are dizziness, sudden trouble walking, or loss of balance. ? E - Eyes. Signs are trouble seeing or a change in how you see. ? F - Face. Signs are sudden weakness or loss of feeling of the face, or the face or eyelid drooping on one side. ? A - Arms. Signs are weakness or loss of feeling in an arm. This happens suddenly and usually on one side of the body. ? S - Speech. Signs are sudden trouble speaking, slurred speech, or trouble understanding what people say. ? T - Time. Time to call emergency services. Write down what time symptoms started.  You have other signs of a stroke, such as: ? A sudden, very bad headache with no known cause. ? Feeling sick to your stomach. ? Throwing up. ? Jerky movements you cannot control (seizure). These symptoms may be an emergency. Do not wait to see if the symptoms will go away. Get medical help right away. Call your local emergency services (911 in the [place] Do not drive  yourself to the hospital. Summary  After the procedure, it is common to have pain or discomfort in the cuts from surgery (incisions).  Do not take baths, swim, or use a hot tub until your doctor says it is okay.  Slowly increase your activities. You may need physical therapy or cardiac  rehab.  Weigh yourself every day. This helps to see if your body is holding fluid. This information is not intended to replace advice given to you by your health care provider. Make sure you discuss any questions you have with your health care provider. Document Revised: 08/30/2017 Document Reviewed: 08/30/2017 Elsevier Patient Education  2021 [employer].

## 2020-04-25 NOTE — Progress Notes (Signed)
Patient having desaturation issues (84-86) while wearing high flow nasal cannula set at 15lpm. Patient changed to heated high flow cannula set at 100% and 30LPM.

## 2020-04-26 ENCOUNTER — Inpatient Hospital Stay (HOSPITAL_COMMUNITY): Payer: Medicare Other

## 2020-04-26 LAB — BPAM RBC
Blood Product Expiration Date: 202204242359
Blood Product Expiration Date: 202204272359
Blood Product Expiration Date: 202205092359
Blood Product Expiration Date: 202205092359
Blood Product Expiration Date: 202205092359
Blood Product Expiration Date: 202205092359
Blood Product Expiration Date: 202205172359
Blood Product Expiration Date: 202205172359
ISSUE DATE / TIME: 202204201238
ISSUE DATE / TIME: 202204201238
ISSUE DATE / TIME: 202204201238
ISSUE DATE / TIME: 202204201238
ISSUE DATE / TIME: 202204201326
ISSUE DATE / TIME: 202204201326
Unit Type and Rh: 600
Unit Type and Rh: 600
Unit Type and Rh: 6200
Unit Type and Rh: 6200
Unit Type and Rh: 6200
Unit Type and Rh: 6200
Unit Type and Rh: 8400
Unit Type and Rh: 8400

## 2020-04-26 LAB — CBC
HCT: 30.8 % — ABNORMAL LOW (ref 36.0–46.0)
Hemoglobin: 10 g/dL — ABNORMAL LOW (ref 12.0–15.0)
MCH: 30 pg (ref 26.0–34.0)
MCHC: 32.5 g/dL (ref 30.0–36.0)
MCV: 92.5 fL (ref 80.0–100.0)
Platelets: 116 10*3/uL — ABNORMAL LOW (ref 150–400)
RBC: 3.33 MIL/uL — ABNORMAL LOW (ref 3.87–5.11)
RDW: 15.6 % — ABNORMAL HIGH (ref 11.5–15.5)
WBC: 14.9 10*3/uL — ABNORMAL HIGH (ref 4.0–10.5)
nRBC: 0 % (ref 0.0–0.2)

## 2020-04-26 LAB — TYPE AND SCREEN
ABO/RH(D): AB POS
Antibody Screen: NEGATIVE
Unit division: 0
Unit division: 0
Unit division: 0
Unit division: 0
Unit division: 0
Unit division: 0
Unit division: 0
Unit division: 0

## 2020-04-26 LAB — BASIC METABOLIC PANEL
Anion gap: 6 (ref 5–15)
BUN: 15 mg/dL (ref 6–20)
CO2: 29 mmol/L (ref 22–32)
Calcium: 8.2 mg/dL — ABNORMAL LOW (ref 8.9–10.3)
Chloride: 103 mmol/L (ref 98–111)
Creatinine, Ser: 0.84 mg/dL (ref 0.44–1.00)
GFR, Estimated: >60 mL/min (ref >60)
Glucose, Bld: 123 mg/dL — ABNORMAL HIGH (ref 70–99)
Potassium: 3.6 mmol/L (ref 3.5–5.1)
Sodium: 138 mmol/L (ref 135–145)

## 2020-04-26 LAB — GLUCOSE, CAPILLARY
Glucose-Capillary: 153 mg/dL — ABNORMAL HIGH (ref 70–99)
Glucose-Capillary: 129 mg/dL — ABNORMAL HIGH (ref 70–99)
Glucose-Capillary: 111 mg/dL — ABNORMAL HIGH (ref 70–99)
Glucose-Capillary: 136 mg/dL — ABNORMAL HIGH (ref 70–99)
Glucose-Capillary: 156 mg/dL — ABNORMAL HIGH (ref 70–99)
Glucose-Capillary: 125 mg/dL — ABNORMAL HIGH (ref 70–99)

## 2020-04-26 LAB — COOXEMETRY PANEL
Carboxyhemoglobin: 0.8 % (ref 0.5–1.5)
Methemoglobin: 1.2 % (ref 0.0–1.5)
O2 Saturation: 65 %
Total hemoglobin: 10.3 g/dL — ABNORMAL LOW (ref 12.0–16.0)

## 2020-04-26 MED ORDER — CARVEDILOL 3.125 MG PO TABS
3.1250 mg | ORAL_TABLET | Freq: Two times a day (BID) | ORAL | Status: DC
  Administered 2020-04-26: 3.125 mg via ORAL
  Filled 2020-04-26: qty 1

## 2020-04-26 MED ORDER — POTASSIUM CHLORIDE CRYS ER 20 MEQ PO TBCR
20.0000 meq | EXTENDED_RELEASE_TABLET | ORAL | Status: AC
  Administered 2020-04-26 (×3): 20 meq via ORAL
  Filled 2020-04-26 (×3): qty 1

## 2020-04-26 MED ORDER — CARVEDILOL 6.25 MG PO TABS
6.2500 mg | ORAL_TABLET | Freq: Two times a day (BID) | ORAL | Status: DC
  Administered 2020-04-27 (×2): 6.25 mg via ORAL
  Filled 2020-04-26 (×2): qty 1

## 2020-04-26 MED ORDER — SODIUM CHLORIDE 0.9 % IV SOLN
INTRAVENOUS | Status: DC | PRN
  Administered 2020-04-26: 500 mL via INTRAVENOUS

## 2020-04-26 MED ORDER — LOSARTAN POTASSIUM 25 MG PO TABS
25.0000 mg | ORAL_TABLET | Freq: Every day | ORAL | Status: DC
  Administered 2020-04-26: 25 mg via ORAL
  Filled 2020-04-26: qty 1

## 2020-04-26 MED ORDER — SODIUM CHLORIDE 0.9 % IV SOLN
2.0000 g | Freq: Three times a day (TID) | INTRAVENOUS | Status: DC
  Administered 2020-04-26 – 2020-05-01 (×15): 2 g via INTRAVENOUS
  Filled 2020-04-26 (×15): qty 2

## 2020-04-26 MED ORDER — VANCOMYCIN HCL 1500 MG/300ML IV SOLN
1500.0000 mg | INTRAVENOUS | Status: DC
  Administered 2020-04-26 – 2020-04-30 (×5): 1500 mg via INTRAVENOUS
  Filled 2020-04-26 (×6): qty 300

## 2020-04-26 NOTE — Progress Notes (Signed)
Pharmacy Antibiotic Note  Sabrina Swanson is a 60 y.o. 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 CXR with worsening aeration to L/R midlungs and R base. Remains on BiPAP despite significant diuresis.  Pharmacy has been consulted for Vancomycin and Cefepime dosing.  WBC up 14.9, afebrile. Renal function stable Scr 0.84, CrCl 71 ml/min. Completed periop antibiotics.  Plan: Vancomycin 1500 mg Q24 hrs (est AUC 495) Cefepime 2 gm IV q8 hrs Monitor renal function, clinical progression Vanc levels as indicated  Height: 5\' 4"  (162.6 cm) Weight: 75.9 kg (167 lb 5.3 oz) IBW/kg (Calculated) : 54.7  Temp (24hrs), Avg:98.1 F (36.7 C), Min:97.6 F (36.4 C), Max:98.7 F (37.1 C)  Recent Labs  Lab 04/23/20 2200 04/24/20 0500 04/24/20 1601 04/25/20 0214 04/26/20 0441  WBC 12.0* 13.0* 12.8* 14.4* 14.9*  CREATININE 1.03* 0.99 0.88 0.83 0.84    Estimated Creatinine Clearance: 71.1 mL/min (by C-G formula based on SCr of 0.84 mg/dL).    Allergies  Allergen Reactions  . Acetaminophen Itching and Nausea Only    Antimicrobials this admission: Vancomycin 4/20 x2; 4/23 >>  Cefepime 4/23 >>  Cefuroxime 4/20 >>4/22  Dose adjustments this admission: N/A  Microbiology results: 4/19 MRSA PCR: neg  Richardine Service, PharmD, BCPS PGY2 Cardiology Pharmacy Resident Phone: [phone] 04/26/2020  10:19 AM  Please check [email or web address] for unit-specific pharmacy phone numbers.

## 2020-04-26 NOTE — Progress Notes (Signed)
3 Days Post-Op Procedure(s) (LRB): CORONARY ARTERY BYPASS GRAFTING (CABG), ON PUMP, TIMES THREE, USING LEFT INTERNAL MAMMARY ARTERY AND ENDOSCOPICALLY HARVESTED RIGHT GREATER SAPHENOUS VEIN (N/A) INDOCYANINE GREEN FLUORESCENCE IMAGING (ICG) (N/A) TRANSESOPHAGEAL ECHOCARDIOGRAM (TEE) (N/A) Subjective:  On bipap all day yesterday. 70% over night. No specific complaints but wants to take some liquids. Denies shortness of breath.  Objective: Vital signs in last 24 hours: Temp:  [97.6 F (36.4 C)-98.7 F (37.1 C)] 98.7 F (37.1 C) (04/23 0801) Pulse Rate:  [90-104] 100 (04/23 1020) Cardiac Rhythm: Normal sinus rhythm (04/23 0400) Resp:  [16-59] 23 (04/23 1020) BP: (108-155)/(71-99) 155/97 (04/23 1020) SpO2:  [89 %-96 %] 96 % (04/23 1020) Arterial Line BP: (127-147)/(56-72) 132/61 (04/22 1500) FiO2 (%):  [60 %-100 %] 100 % (04/23 1020) Weight:  [75.9 kg] 75.9 kg (04/23 0500)  Hemodynamic parameters for last 24 hours:    Intake/Output from previous day: 04/22 0701 - 04/23 0700 In: 1320.9 [P.O.:60; I.V.:1160.9; IV Piggyback:100] Out: 6810 [Urine:6530; Chest Tube:280] Intake/Output this shift: Total I/O In: 4.8 [I.V.:4.8] Out: 1200 [Urine:1200]  General appearance: alert and cooperative Neurologic: intact Heart: regular rate and rhythm, S1, S2 normal, no murmur Lungs: few rhonchi bilat Extremities: no edema Wound: incision ok  Lab Results: Recent Labs    04/25/20 0214 04/25/20 0221 04/25/20 0627 04/26/20 0441  WBC 14.4*  --   --  14.9*  HGB 10.1*   < > 9.9* 10.0*  HCT 30.4*   < > 29.0* 30.8*  PLT 102*  --   --  116*   < > = values in this interval not displayed.   BMET:  Recent Labs    04/25/20 0214 04/25/20 0221 04/25/20 0627 04/26/20 0441  NA 139   < > 143 138  K 3.8   < > 3.9 3.6  CL 109  --   --  103  CO2 24  --   --  29  GLUCOSE 134*  --   --  123*  BUN 12  --   --  15  CREATININE 0.83  --   --  0.84  CALCIUM 7.7*  --   --  8.2*   < > = values in this  interval not displayed.    PT/INR:  Recent Labs    04/23/20 1609  LABPROT 18.9*  INR 1.6*   ABG    Component Value Date/Time   PHART 7.404 04/25/2020 0627   HCO3 22.8 04/25/2020 0627   TCO2 24 04/25/2020 0627   ACIDBASEDEF 2.0 04/25/2020 0627   O2SAT 65.0 04/26/2020 0441   CBG (last 3)  Recent Labs    04/25/20 2345 04/26/20 0318 04/26/20 0655  GLUCAP 140* 153* 129*   Narrative & Impression  CLINICAL DATA:  Status post CABG  EXAM: PORTABLE CHEST 1 VIEW  COMPARISON:  04/25/2020  FINDINGS: Removal of Swan-Ganz catheter. Right IJ Cordis remains in place. Interval removal of bilateral chest tubes. No pneumothorax identified. Interval increase in left pleural effusion. Worsening aeration to the left midlung. Patchy airspace opacities within the right midlung and right lower lobe appear increased from previous exam.  IMPRESSION: 1. Interval removal of bilateral chest tubes. No pneumothorax. 2. Worsening aeration to the left midlung right midlung and right base.   Electronically Signed   By: Kerby Moors M.D.   On: 04/26/2020 08:11    Assessment/Plan: S/P Procedure(s) (LRB): CORONARY ARTERY BYPASS GRAFTING (CABG), ON PUMP, TIMES THREE, USING LEFT INTERNAL MAMMARY ARTERY AND ENDOSCOPICALLY HARVESTED RIGHT GREATER SAPHENOUS VEIN (  N/A) INDOCYANINE GREEN FLUORESCENCE IMAGING (ICG) (N/A) TRANSESOPHAGEAL ECHOCARDIOGRAM (TEE) (N/A)  POD 3  Hemodynamically stable with tendency to HTN. Co-ox 65% so will DC milrinone. EF 25-30% in OR. Repeat Co-ox in am. Start low dose Coreg.  Acute postop hypoxemic respiratory failure requiring 70% bipap. CXR shows bilateral patchy airspace opacities. She diuresed -5500 cc yesterday without improvement and is only 2 lbs over preop. I suspect this may be pneumonia. Discussed with pharmacy and will start empiric vanc and Maxipime. WBC mildly elevated but no fever. No sputum. Try HFNC and use bipap as needed.  Volume excess: much  improved with lasix drip. Now only 2 lbs up so will stop lasix drip. Replace K+  IS, OOB.  DM: glucose under control on SSI.   LOS: 4 days    Sabrina Swanson 04/26/2020

## 2020-04-26 NOTE — Progress Notes (Signed)
Patient ID: Sabrina Swanson, female   DOB: 06-13-1960, 60 y.o.   MRN: [id number] [facility] Evening Rounds  Hypertensive. Will increase Coreg further to 6.25 and resume losartan.  Has been on HFNC all day 80% O2 and sats 95%  Taking some liquids.  Good urine output today since lasix stopped. -2L.

## 2020-04-26 NOTE — Plan of Care (Signed)
  Problem: Cardiovascular: Goal: Ability to achieve and maintain adequate cardiovascular perfusion will improve Outcome: Progressing   Problem: Clinical Measurements: Goal: Ability to maintain clinical measurements within normal limits will improve Outcome: Progressing   Problem: Pain Managment: Goal: General experience of comfort will improve Outcome: Progressing   Problem: Cardiac: Goal: Will achieve and/or maintain hemodynamic stability Outcome: Progressing   Problem: Clinical Measurements: Goal: Postoperative complications will be avoided or minimized Outcome: Progressing   Problem: Activity: Goal: Ability to return to baseline activity level will improve Outcome: Not Progressing Note: Difficulty mobilizing due to respiratory issues; currently on bipap.   Problem: Clinical Measurements: Goal: Respiratory complications will improve Outcome: Not Progressing   Problem: Nutrition: Goal: Adequate nutrition will be maintained Outcome: Not Progressing

## 2020-04-27 ENCOUNTER — Inpatient Hospital Stay (HOSPITAL_COMMUNITY): Payer: Medicare Other

## 2020-04-27 LAB — BASIC METABOLIC PANEL
Anion gap: 6 (ref 5–15)
BUN: 14 mg/dL (ref 6–20)
CO2: 27 mmol/L (ref 22–32)
Calcium: 8.8 mg/dL — ABNORMAL LOW (ref 8.9–10.3)
Chloride: 105 mmol/L (ref 98–111)
Creatinine, Ser: 0.64 mg/dL (ref 0.44–1.00)
GFR, Estimated: >60 mL/min (ref >60)
Glucose, Bld: 112 mg/dL — ABNORMAL HIGH (ref 70–99)
Potassium: 3.7 mmol/L (ref 3.5–5.1)
Sodium: 138 mmol/L (ref 135–145)

## 2020-04-27 LAB — GLUCOSE, CAPILLARY
Glucose-Capillary: 110 mg/dL — ABNORMAL HIGH (ref 70–99)
Glucose-Capillary: 113 mg/dL — ABNORMAL HIGH (ref 70–99)
Glucose-Capillary: 121 mg/dL — ABNORMAL HIGH (ref 70–99)
Glucose-Capillary: 127 mg/dL — ABNORMAL HIGH (ref 70–99)
Glucose-Capillary: 128 mg/dL — ABNORMAL HIGH (ref 70–99)
Glucose-Capillary: 137 mg/dL — ABNORMAL HIGH (ref 70–99)

## 2020-04-27 LAB — COOXEMETRY PANEL
Carboxyhemoglobin: 0.8 % (ref 0.5–1.5)
Methemoglobin: 1 % (ref 0.0–1.5)
O2 Saturation: 54.1 %
Total hemoglobin: 11.2 g/dL — ABNORMAL LOW (ref 12.0–16.0)

## 2020-04-27 MED ORDER — POTASSIUM CHLORIDE CRYS ER 20 MEQ PO TBCR
20.0000 meq | EXTENDED_RELEASE_TABLET | ORAL | Status: AC
  Administered 2020-04-27 (×3): 20 meq via ORAL
  Filled 2020-04-27 (×3): qty 1

## 2020-04-27 MED ORDER — FUROSEMIDE 10 MG/ML IJ SOLN
40.0000 mg | Freq: Two times a day (BID) | INTRAMUSCULAR | Status: AC
  Administered 2020-04-27 (×2): 40 mg via INTRAVENOUS
  Filled 2020-04-27 (×2): qty 4

## 2020-04-27 MED ORDER — LOSARTAN POTASSIUM 50 MG PO TABS
50.0000 mg | ORAL_TABLET | Freq: Every day | ORAL | Status: DC
  Administered 2020-04-27 – 2020-05-01 (×5): 50 mg via ORAL
  Filled 2020-04-27 (×5): qty 1

## 2020-04-27 NOTE — Progress Notes (Signed)
4 Days Post-Op Procedure(s) (LRB): CORONARY ARTERY BYPASS GRAFTING (CABG), ON PUMP, TIMES THREE, USING LEFT INTERNAL MAMMARY ARTERY AND ENDOSCOPICALLY HARVESTED RIGHT GREATER SAPHENOUS VEIN (N/A) INDOCYANINE GREEN FLUORESCENCE IMAGING (ICG) (N/A) TRANSESOPHAGEAL ECHOCARDIOGRAM (TEE) (N/A) Subjective:  Feels a little better today. Sitting up in chair. Denies shortness of breath. Some pains in upper abdomen.  Remains on HFNC but FiO2 increased overnight to 100% to maintain sats> 90.  Taking some liquids. No BM yet.  Objective: Vital signs in last 24 hours: Temp:  [97.6 F (36.4 C)-98.2 F (36.8 C)] 98 F (36.7 C) (04/24 0802) Pulse Rate:  [72-101] 99 (04/24 0845) Cardiac Rhythm: Normal sinus rhythm (04/24 0400) Resp:  [19-41] 29 (04/24 0845) BP: (115-179)/(63-115) 125/82 (04/24 0845) SpO2:  [86 %-99 %] 95 % (04/24 0845) FiO2 (%):  [50 %-100 %] 100 % (04/24 0800) Weight:  [77.2 kg] 77.2 kg (04/24 0500)  Hemodynamic parameters for last 24 hours:    Intake/Output from previous day: 04/23 0701 - 04/24 0700 In: 1381.1 [P.O.:360; I.V.:463.5; IV Piggyback:557.7] Out: 4680 [Urine:4680] Intake/Output this shift: Total I/O In: 357 [P.O.:220; I.V.:94.1; IV Piggyback:42.9] Out: 300 [Urine:300]  General appearance: alert and cooperative Neurologic: intact Heart: regular rate and rhythm, S1, S2 normal, no murmur Lungs: rales bilaterally Abdomen: soft, non-tender; bowel sounds normal Extremities: extremities normal, atraumatic, no cyanosis or edema Wound: incision ok  Lab Results: Recent Labs    04/25/20 0214 04/25/20 0221 04/25/20 0627 04/26/20 0441  WBC 14.4*  --   --  14.9*  HGB 10.1*   < > 9.9* 10.0*  HCT 30.4*   < > 29.0* 30.8*  PLT 102*  --   --  116*   < > = values in this interval not displayed.   BMET:  Recent Labs    04/26/20 0441 04/27/20 0339  NA 138 138  K 3.6 3.7  CL 103 105  CO2 29 27  GLUCOSE 123* 112*  BUN 15 14  CREATININE 0.84 0.64  CALCIUM  8.2* 8.8*    PT/INR: No results for input(s): LABPROT, INR in the last 72 hours. ABG    Component Value Date/Time   PHART 7.404 04/25/2020 0627   HCO3 22.8 04/25/2020 0627   TCO2 24 04/25/2020 0627   ACIDBASEDEF 2.0 04/25/2020 0627   O2SAT 54.1 04/27/2020 0339   CBG (last 3)  Recent Labs    04/26/20 2322 04/27/20 0328 04/27/20 0634  GLUCAP 125* 121* 113*   Narrative & Impression  CLINICAL DATA:  Status post CABG  EXAM: PORTABLE CHEST 1 VIEW  COMPARISON:  04/26/2020  FINDINGS: There is a right IJ catheter with tip in the SVC. Previous median sternotomy and CABG procedure. Heart size is enlarged. Diffuse interstitial and airspace opacities are noted throughout both lungs. Left pleural effusion and dense airspace opacification in the left base noted. Visualized osseous structures are unremarkable.  IMPRESSION: Cardiomegaly and diffuse interstitial and airspace opacities throughout both lungs compatible with CHF.   Electronically Signed   By: Kerby Moors M.D.   On: 04/27/2020 07:53     Assessment/Plan: S/P Procedure(s) (LRB): CORONARY ARTERY BYPASS GRAFTING (CABG), ON PUMP, TIMES THREE, USING LEFT INTERNAL MAMMARY ARTERY AND ENDOSCOPICALLY HARVESTED RIGHT GREATER SAPHENOUS VEIN (N/A) INDOCYANINE GREEN FLUORESCENCE IMAGING (ICG) (N/A) TRANSESOPHAGEAL ECHOCARDIOGRAM (TEE) (N/A)  POD 4  Hemodynamically stable but still tends to get hypertensive. Cardene resumed overnight. Co-ox dropped a little to 54.1 this am, likely related to oxygenation. Continue Coreg. Increase Losartan to 50 and wean off Cardene. Preop EF  25-30%.  Acute postop hypoxemic respiratory failure requiring bipap and HFNC. CXR about the same with bilateral patchy airspace and interstitial opacities. Not sure if this is CHF or some pneumonia. She has continued to diurese well and was -3300 cc yesterday. Wt recorded is 3 lbs over yesterday which can't be accurate. 5 lbs over preop. Will continue  diuresis today. Continue empiric Vanc and Maxipime.  DM: glucose under good control. Keep on liquids until lungs improve.  IS, OOB. She reportedly transferred to chair well.   LOS: 5 days    Sabrina Swanson 04/27/2020

## 2020-04-27 NOTE — Progress Notes (Signed)
Patient ID: Sabrina Swanson, female   DOB: May 04, 1960, 60 y.o.   MRN: [id number] [facility] afternoon rounds:  Hemodynamically stable. Cardene is off. She is diuresing well with lasix. -1L so far today. FiO2 down to 80%.  Had BM.

## 2020-04-28 ENCOUNTER — Inpatient Hospital Stay (HOSPITAL_COMMUNITY): Payer: Medicare Other

## 2020-04-28 DIAGNOSIS — J9691 Respiratory failure, unspecified with hypoxia: Secondary | ICD-10-CM

## 2020-04-28 DIAGNOSIS — I5033 Acute on chronic diastolic (congestive) heart failure: Secondary | ICD-10-CM

## 2020-04-28 LAB — CBC
HCT: 33.5 % — ABNORMAL LOW (ref 36.0–46.0)
Hemoglobin: 11.2 g/dL — ABNORMAL LOW (ref 12.0–15.0)
MCH: 30.4 pg (ref 26.0–34.0)
MCHC: 33.4 g/dL (ref 30.0–36.0)
MCV: 91 fL (ref 80.0–100.0)
Platelets: 214 10*3/uL (ref 150–400)
RBC: 3.68 MIL/uL — ABNORMAL LOW (ref 3.87–5.11)
RDW: 14.5 % (ref 11.5–15.5)
WBC: 9.9 10*3/uL (ref 4.0–10.5)
nRBC: 0 % (ref 0.0–0.2)

## 2020-04-28 LAB — COOXEMETRY PANEL
Carboxyhemoglobin: 0.9 % (ref 0.5–1.5)
Methemoglobin: 1 % (ref 0.0–1.5)
O2 Saturation: 67.4 %
Total hemoglobin: 11.8 g/dL — ABNORMAL LOW (ref 12.0–16.0)

## 2020-04-28 LAB — GLUCOSE, CAPILLARY
Glucose-Capillary: 124 mg/dL — ABNORMAL HIGH (ref 70–99)
Glucose-Capillary: 128 mg/dL — ABNORMAL HIGH (ref 70–99)
Glucose-Capillary: 88 mg/dL (ref 70–99)
Glucose-Capillary: 139 mg/dL — ABNORMAL HIGH (ref 70–99)
Glucose-Capillary: 116 mg/dL — ABNORMAL HIGH (ref 70–99)

## 2020-04-28 LAB — BASIC METABOLIC PANEL
Anion gap: 8 (ref 5–15)
BUN: 14 mg/dL (ref 6–20)
CO2: 28 mmol/L (ref 22–32)
Calcium: 8.7 mg/dL — ABNORMAL LOW (ref 8.9–10.3)
Chloride: 104 mmol/L (ref 98–111)
Creatinine, Ser: 0.74 mg/dL (ref 0.44–1.00)
GFR, Estimated: >60 mL/min (ref >60)
Glucose, Bld: 145 mg/dL — ABNORMAL HIGH (ref 70–99)
Potassium: 3.3 mmol/L — ABNORMAL LOW (ref 3.5–5.1)
Sodium: 140 mmol/L (ref 135–145)

## 2020-04-28 MED ORDER — ACETAMINOPHEN 500 MG PO TABS
500.0000 mg | ORAL_TABLET | Freq: Four times a day (QID) | ORAL | Status: DC
  Administered 2020-04-28 – 2020-05-01 (×11): 500 mg via ORAL
  Filled 2020-04-28 (×11): qty 1

## 2020-04-28 MED ORDER — FUROSEMIDE 40 MG PO TABS
40.0000 mg | ORAL_TABLET | Freq: Every day | ORAL | Status: DC
  Administered 2020-04-28 – 2020-04-29 (×2): 40 mg via ORAL
  Filled 2020-04-28 (×2): qty 1

## 2020-04-28 MED ORDER — METOPROLOL TARTRATE 12.5 MG HALF TABLET: 12.5000 mg | ORAL_TABLET | Freq: Two times a day (BID) | ORAL | Status: DC

## 2020-04-28 MED ORDER — LIDOCAINE 5 % EX PTCH
1.0000 | MEDICATED_PATCH | CUTANEOUS | Status: DC
  Administered 2020-04-28 – 2020-04-30 (×3): 1 via TRANSDERMAL
  Filled 2020-04-28 (×5): qty 1

## 2020-04-28 MED ORDER — CARVEDILOL 12.5 MG PO TABS
12.5000 mg | ORAL_TABLET | Freq: Two times a day (BID) | ORAL | Status: DC
  Administered 2020-04-28 – 2020-04-30 (×5): 12.5 mg via ORAL
  Filled 2020-04-28 (×5): qty 1

## 2020-04-28 MED ORDER — INSULIN ASPART 100 UNIT/ML ~~LOC~~ SOLN
0.0000 [IU] | Freq: Three times a day (TID) | SUBCUTANEOUS | Status: DC
  Administered 2020-04-29 (×2): 4 [IU] via SUBCUTANEOUS
  Administered 2020-04-30 – 2020-05-01 (×4): 2 [IU] via SUBCUTANEOUS

## 2020-04-28 MED ORDER — POTASSIUM CHLORIDE CRYS ER 20 MEQ PO TBCR
20.0000 meq | EXTENDED_RELEASE_TABLET | ORAL | Status: AC
  Administered 2020-04-28 (×3): 20 meq via ORAL
  Filled 2020-04-28 (×3): qty 1

## 2020-04-28 MED ORDER — IOHEXOL 300 MG/ML  SOLN
80.0000 mL | Freq: Once | INTRAMUSCULAR | Status: AC | PRN
  Administered 2020-04-28: 80 mL via INTRAVENOUS

## 2020-04-28 MED ORDER — GABAPENTIN 600 MG PO TABS
300.0000 mg | ORAL_TABLET | Freq: Two times a day (BID) | ORAL | Status: DC
  Administered 2020-04-28 – 2020-05-01 (×6): 300 mg via ORAL
  Filled 2020-04-28 (×6): qty 1

## 2020-04-28 NOTE — Progress Notes (Signed)
Patient removed from HFNC and placed on 5L [place]. Patient is maintaining 02 sats at 96%. No complications. Patient is tolerating well. RT will continue to monitor.

## 2020-04-28 NOTE — Progress Notes (Addendum)
[facility] DAILY ICU PROGRESS NOTE                   [address]            [place] [address]          [phone]   5 Days Post-Op Procedure(s) (LRB): CORONARY ARTERY BYPASS GRAFTING (CABG), ON PUMP, TIMES THREE, USING LEFT INTERNAL MAMMARY ARTERY AND ENDOSCOPICALLY HARVESTED RIGHT GREATER SAPHENOUS VEIN (N/A) INDOCYANINE GREEN FLUORESCENCE IMAGING (ICG) (N/A) TRANSESOPHAGEAL ECHOCARDIOGRAM (TEE) (N/A)  Total Length of Stay:  LOS: 6 days   Subjective: Alert, hungry, some sputum production  Objective: Vital signs in last 24 hours: Temp:  [97.6 F (36.4 C)-98.5 F (36.9 C)] 98.5 F (36.9 C) (04/25 0402) Pulse Rate:  [84-106] 95 (04/25 0700) Cardiac Rhythm: Normal sinus rhythm (04/25 0400) Resp:  [16-42] 42 (04/25 0700) BP: (92-155)/(73-99) 144/99 (04/25 0700) SpO2:  [91 %-99 %] 98 % (04/25 0700) FiO2 (%):  [45 %-100 %] 45 % (04/25 0328) Weight:  [74 kg] 74 kg (04/25 0500)  Filed Weights   04/26/20 0500 04/27/20 0500 04/28/20 0500  Weight: 75.9 kg 77.2 kg 74 kg    Weight change: -3.2 kg   Hemodynamic parameters for last 24 hours:    Intake/Output from previous day: 04/24 0701 - 04/25 0700 In: 1707.5 [P.O.:580; I.V.:484.4; IV Piggyback:643.1] Out: 3791 [Urine:3790; Stool:1]  Intake/Output this shift: No intake/output data recorded.  Current Meds: Scheduled Meds: . aspirin EC  81 mg Oral Daily  . atorvastatin  80 mg Oral Daily  . bisacodyl  10 mg Oral Daily   Or  . bisacodyl  10 mg Rectal Daily  . carvedilol  6.25 mg Oral BID WC  . chlorhexidine gluconate (MEDLINE KIT)  15 mL Mouth Rinse BID  . Chlorhexidine Gluconate Cloth  6 each Topical Daily  . clopidogrel  75 mg Oral Daily  . colchicine  0.3 mg Oral BID  . docusate sodium  200 mg Oral Daily  . insulin aspart  0-24 Units Subcutaneous Q4H  . losartan  50 mg Oral Daily  . pantoprazole  40 mg Oral Daily  . potassium chloride  20 mEq Oral Q4H  . sodium chloride flush  3 mL Intravenous Q12H    Continuous Infusions: . sodium chloride 10 mL/hr at 04/28/20 0700  . ceFEPime (MAXIPIME) IV Stopped (04/28/20 0548)  . lactated ringers    . lactated ringers Stopped (04/25/20 1526)  . niCARDipine Stopped (04/27/20 1059)  . vancomycin Stopped (04/27/20 1301)   PRN Meds:.sodium chloride, dextrose, levalbuterol, metoprolol tartrate, morphine injection, ondansetron (ZOFRAN) IV, oxyCODONE, sodium chloride flush, traMADol  General appearance: alert, cooperative and no distress Heart: regular rate and rhythm Lungs: min dim in bases, no wheeze or crackles Abdomen: benign Extremities: PAS in place Wound: incis healing well  Lab Results: CBC: Recent Labs    04/26/20 0441 04/28/20 0524  WBC 14.9* 9.9  HGB 10.0* 11.2*  HCT 30.8* 33.5*  PLT 116* 214   BMET:  Recent Labs    04/27/20 0339 04/28/20 0524  NA 138 140  K 3.7 3.3*  CL 105 104  CO2 27 28  GLUCOSE 112* 145*  BUN 14 14  CREATININE 0.64 0.74  CALCIUM 8.8* 8.7*    CMET: Lab Results  Component Value Date   WBC 9.9 04/28/2020   HGB 11.2 (L) 04/28/2020   HCT 33.5 (L) 04/28/2020   PLT 214 04/28/2020   GLUCOSE 145 (H) 04/28/2020   ALT 63 (H)  04/23/2020   AST 127 (H) 04/23/2020   NA 140 04/28/2020   K 3.3 (L) 04/28/2020   CL 104 04/28/2020   CREATININE 0.74 04/28/2020   BUN 14 04/28/2020   CO2 28 04/28/2020   INR 1.6 (H) 04/23/2020   HGBA1C 6.9 (H) 04/23/2020   DG CHEST PORT 1 VIEW  Result Date: 04/27/2020 CLINICAL DATA:  Status post CABG EXAM: PORTABLE CHEST 1 VIEW COMPARISON:  04/26/2020 FINDINGS: There is a right IJ catheter with tip in the SVC. Previous median sternotomy and CABG procedure. Heart size is enlarged. Diffuse interstitial and airspace opacities are noted throughout both lungs. Left pleural effusion and dense airspace opacification in the left base noted. Visualized osseous structures are unremarkable. IMPRESSION: Cardiomegaly and diffuse interstitial and airspace opacities throughout both lungs  compatible with CHF. Electronically Signed   By: Kerby Moors M.D.   On: 04/27/2020 07:53    PT/INR: No results for input(s): LABPROT, INR in the last 72 hours. Radiology: No results found.   Assessment/Plan: S/P Procedure(s) (LRB): CORONARY ARTERY BYPASS GRAFTING (CABG), ON PUMP, TIMES THREE, USING LEFT INTERNAL MAMMARY ARTERY AND ENDOSCOPICALLY HARVESTED RIGHT GREATER SAPHENOUS VEIN (N/A) INDOCYANINE GREEN FLUORESCENCE IMAGING (ICG) (N/A) TRANSESOPHAGEAL ECHOCARDIOGRAM (TEE) (N/A)  1 afeb, VSS, sBP quie variable 90's to 150's, Co-oX 67- no IV pressors. Sinus , some occas missed beats 2 O2 sats ok on 15 l HFNC, push pulm toilet, I think we can advance diet 3 normal renal fxn, excellent diuresis, weight at/about preop, reduce lasix 4 expected ABLA anemia is very stable 5 platelet count now in normal range 6 hypokalemia- replace 7 leukocytosis resolved 8 CXR aeration improved, less evid of CHF, on empiric vanc/maxepime 9 blood sugars well controlled 10 PT for assist with rehab      John Giovanni PA-C Pager [phone] 04/28/2020 7:16 AM  Pt seen and examined; agree with documentation. Will have CCM, PT/OT. Stay in ICU one more day Shad Ledvina Z. Orvan Seen, [place]

## 2020-04-28 NOTE — Progress Notes (Signed)
EVENING ROUNDS NOTE :     [address]       [place] [address]             [phone]                 5 Days Post-Op Procedure(s) (LRB): CORONARY ARTERY BYPASS GRAFTING (CABG), ON PUMP, TIMES THREE, USING LEFT INTERNAL MAMMARY ARTERY AND ENDOSCOPICALLY HARVESTED RIGHT GREATER SAPHENOUS VEIN (N/A) INDOCYANINE GREEN FLUORESCENCE IMAGING (ICG) (N/A) TRANSESOPHAGEAL ECHOCARDIOGRAM (TEE) (N/A)   Total Length of Stay:  LOS: 6 days  Events:   No events Remains on HF Anthony Good uop    BP (!) 148/93 (BP Location: Left Arm)   Pulse 88   Temp 99.3 F (37.4 C) (Axillary)   Resp (!) 35   Ht 5\' 4"  (1.626 m)   Wt 74 kg   SpO2 100%   BMI 28.00 kg/m      FiO2 (%):  [40 %-50 %] 40 %  . sodium chloride 10 mL/hr at 04/28/20 0700  . ceFEPime (MAXIPIME) IV 2 g (04/28/20 1430)  . lactated ringers    . lactated ringers Stopped (04/25/20 1526)  . niCARDipine Stopped (04/27/20 1059)  . vancomycin 1,500 mg (04/28/20 1210)    I/O last 3 completed shifts: In: 2258.3 [P.O.:580; I.V.:877.6; IV Piggyback:800.7] Out: 5146 [Urine:5145; Stool:1]   CBC Latest Ref Rng & Units 04/28/2020 04/26/2020 04/25/2020  WBC 4.0 - 10.5 K/uL 9.9 14.9(H) -  Hemoglobin 12.0 - 15.0 g/dL 11.2(L) 10.0(L) 9.9(L)  Hematocrit 36.0 - 46.0 % 33.5(L) 30.8(L) 29.0(L)  Platelets 150 - 400 K/uL 214 116(L) -    BMP Latest Ref Rng & Units 04/28/2020 04/27/2020 04/26/2020  Glucose 70 - 99 mg/dL 145(H) 112(H) 123(H)  BUN 6 - 20 mg/dL 14 14 15   Creatinine 0.44 - 1.00 mg/dL 0.74 0.64 0.84  BUN/Creat Ratio 12 - 28 - - -  Sodium 135 - 145 mmol/L 140 138 138  Potassium 3.5 - 5.1 mmol/L 3.3(L) 3.7 3.6  Chloride 98 - 111 mmol/L 104 105 103  CO2 22 - 32 mmol/L 28 27 29   Calcium 8.9 - 10.3 mg/dL 8.7(L) 8.8(L) 8.2(L)    ABG    Component Value Date/Time   PHART 7.404 04/25/2020 0627   PCO2ART 36.4 04/25/2020 0627   PO2ART 78 (L) 04/25/2020 0627   HCO3 22.8 04/25/2020 0627   TCO2 24 04/25/2020 0627    ACIDBASEDEF 2.0 04/25/2020 0627   O2SAT 67.4 04/28/2020 0524       Melodie Bouillon, MD 04/28/2020 5:42 PM

## 2020-04-28 NOTE — Evaluation (Signed)
Physical Therapy Evaluation Patient Details Name: Sabrina Swanson MRN: [id number] DOB: [birth year] Today's Date: 04/28/2020   History of Present Illness  60 yo admitted 4/19 with CAD s/p CABGx 3 on 4/20. PMhx: HTN, COPD, DM, HLD  Clinical Impression  Pt pleasant and educated for sternal precautions, tranfers, gait and progressive mobility. Pt gait distance limited by [place] but able to walk short laps forward and back in room without significant fatigue or pain. Pt provided handout for sternal precautions. Pt with decreased activity tolerance, cardiopulmonary function, and mobility who will benefit from acute therapy to maximize mobility, safety and function adhering to precautions prior to D/C.   HR 88-92 SpO2 94% on 15L at 40% FiO2 BP 119/87 (96)    Follow Up Recommendations Supervision - Intermittent;Home health PT    Equipment Recommendations  Rolling walker with 5" wheels    Recommendations for Other Services       Precautions / Restrictions Precautions Precautions: Fall;Other (comment);Sternal Precaution Booklet Issued: Yes (comment) Precaution Comments: [facility]      Mobility  Bed Mobility Overal bed mobility: Needs Assistance Bed Mobility: Rolling;Sidelying to Sit Rolling: Min guard Sidelying to sit: Min guard       General bed mobility comments: cues for sequence with HOB 25 degrees and education for precautions with pt holding pillow with transfers    Transfers Overall transfer level: Needs assistance   Transfers: Sit to/from Stand Sit to Stand: Min guard         General transfer comment: cues for hand placement and safety with pt able to stand without physical assist  Ambulation/Gait Ambulation/Gait assistance: Supervision Gait Distance (Feet): 80 Feet Assistive device: Rolling walker (2 wheeled) Gait Pattern/deviations: Step-through pattern;Decreased stride length   Gait velocity interpretation: 1.31 - 2.62 ft/sec, indicative of limited community  ambulator General Gait Details: pt walking 4-6' at a time forward and back at chair with RW x grossly 10 bouts. Pt reports reliance on RW for gait with SpO2 maintained 94%  Stairs            Wheelchair Mobility    Modified Rankin (Stroke Patients Only)       Balance Overall balance assessment: Mild deficits observed, not formally tested                                           Pertinent Vitals/Pain Pain Assessment: No/denies pain    Home Living Family/patient expects to be discharged to:: Private residence Living Arrangements: Children Available Help at Discharge: Family;Available PRN/intermittently Type of Home: House Home Access: Stairs to enter   [employer] of Steps: 4 Home Layout: One level Home Equipment: None      Prior Function Level of Independence: Independent               Hand Dominance        Extremity/Trunk Assessment   Upper Extremity Assessment Upper Extremity Assessment: Overall WFL for tasks assessed    Lower Extremity Assessment Lower Extremity Assessment: Overall WFL for tasks assessed    Cervical / Trunk Assessment Cervical / Trunk Assessment: Normal  Communication   Communication: No difficulties  Cognition Arousal/Alertness: Awake/alert Behavior During Therapy: WFL for tasks assessed/performed Overall Cognitive Status: Impaired/Different from baseline Area of Impairment: Memory                     Memory: Decreased recall of precautions  General Comments      Exercises General Exercises - Lower Extremity Long Arc Quad: AROM;Both;10 reps;Seated Hip Flexion/Marching: AROM;Both;10 reps;Seated   Assessment/Plan    PT Assessment Patient needs continued PT services  PT Problem List Decreased strength;Decreased mobility;Decreased activity tolerance;Decreased balance;Decreased knowledge of use of DME;Decreased safety awareness;Decreased knowledge of  precautions       PT Treatment Interventions Gait training;Functional mobility training;Therapeutic activities;Patient/family education;Stair training;DME instruction;Therapeutic exercise    PT Goals (Current goals can be found in the Care Plan section)  Acute Rehab PT Goals Patient Stated Goal: return home, clean the house and wash the car PT Goal Formulation: With patient Time For Goal Achievement: 05/12/20 Potential to Achieve Goals: Good    Frequency Min 3X/week   Barriers to discharge        Co-evaluation               AM-PAC PT "6 Clicks" Mobility  Outcome Measure Help needed turning from your back to your side while in a flat bed without using bedrails?: A Little Help needed moving from lying on your back to sitting on the side of a flat bed without using bedrails?: A Little Help needed moving to and from a bed to a chair (including a wheelchair)?: A Little Help needed standing up from a chair using your arms (e.g., wheelchair or bedside chair)?: A Little Help needed to walk in hospital room?: A Little Help needed climbing 3-5 steps with a railing? : A Little 6 Click Score: 18    End of Session   Activity Tolerance: Patient tolerated treatment well Patient left: in chair;with nursing/sitter in room Nurse Communication: Mobility status;Precautions PT Visit Diagnosis: Other abnormalities of gait and mobility (R26.89);Difficulty in walking, not elsewhere classified (R26.2)    Time: 1610-9604 PT Time Calculation (min) (ACUTE ONLY): 24 min   Charges:   PT Evaluation $PT Eval Moderate Complexity: 1 Mod          Melainie Krinsky P, PT Acute Rehabilitation Services Pager: [phone] Office: [phone]   Sandy Salaam [place] 04/28/2020, 1:36 PM

## 2020-04-28 NOTE — Progress Notes (Signed)
Progress Note  Patient Name: Sabrina Swanson Date of Encounter: 04/28/2020  [facility] Cardiologist: No primary care provider on file.   Subjective   Sitting up in chair, feeling better. Remains on high flow O2. No CP.   Inpatient Medications    Scheduled Meds: . aspirin EC  81 mg Oral Daily  . atorvastatin  80 mg Oral Daily  . bisacodyl  10 mg Oral Daily   Or  . bisacodyl  10 mg Rectal Daily  . carvedilol  6.25 mg Oral BID WC  . chlorhexidine gluconate (MEDLINE KIT)  15 mL Mouth Rinse BID  . Chlorhexidine Gluconate Cloth  6 each Topical Daily  . clopidogrel  75 mg Oral Daily  . colchicine  0.3 mg Oral BID  . docusate sodium  200 mg Oral Daily  . insulin aspart  0-24 Units Subcutaneous Q4H  . losartan  50 mg Oral Daily  . pantoprazole  40 mg Oral Daily  . potassium chloride  20 mEq Oral Q4H  . sodium chloride flush  3 mL Intravenous Q12H   Continuous Infusions: . sodium chloride 10 mL/hr at 04/28/20 0700  . ceFEPime (MAXIPIME) IV Stopped (04/28/20 0548)  . lactated ringers    . lactated ringers Stopped (04/25/20 1526)  . niCARDipine Stopped (04/27/20 1059)  . vancomycin Stopped (04/27/20 1301)   PRN Meds: sodium chloride, dextrose, levalbuterol, metoprolol tartrate, morphine injection, ondansetron (ZOFRAN) IV, oxyCODONE, sodium chloride flush, traMADol   Vital Signs    Vitals:   04/28/20 0402 04/28/20 0500 04/28/20 0600 04/28/20 0700  BP:   116/80 (!) 144/99  Pulse:  98 89 95  Resp:  (!) 28 (!) 24 (!) 42  Temp: 98.5 F (36.9 C)     TempSrc: Axillary     SpO2:  95% 96% 98%  Weight:  74 kg    Height:        Intake/Output Summary (Last 24 hours) at 04/28/2020 0720 Last data filed at 04/28/2020 0700 Gross per 24 hour  Intake 1707.53 ml  Output 3791 ml  Net -2083.47 ml   Last 3 Weights 04/28/2020 04/27/2020 04/26/2020  Weight (lbs) 163 lb 2.3 oz 170 lb 3.1 oz 167 lb 5.3 oz  Weight (kg) 74 kg 77.2 kg 75.9 kg      Telemetry    Sinus rhythm, no  arrhythmia - Personally Reviewed   Physical Exam  Alert, oriented, NAD GEN: No acute distress.   Neck: No JVD Cardiac: RRR, no murmurs, rubs, or gallops.  Respiratory: diminished bilaterally in the lower lung fields GI: Soft, nontender, non-distended  MS: No edema; No deformity. Neuro:  Nonfocal  Psych: Normal affect   Labs    High Sensitivity Troponin:  No results for input(s): TROPONINIHS in the last 720 hours.    Chemistry Recent Labs  Lab 04/23/20 0512 04/23/20 0918 04/23/20 2200 04/24/20 0454 04/26/20 0441 04/27/20 0339 04/28/20 0524  NA  --    < > 141   < > 138 138 140  K  --    < > 4.6   < > 3.6 3.7 3.3*  CL  --    < > 110   < > 103 105 104  CO2  --   --  24   < > '29 27 28  ' GLUCOSE  --    < > 168*   < > 123* 112* 145*  BUN  --    < > 10   < > '15 14 14  ' CREATININE  --    < >  1.03*   < > 0.84 0.64 0.74  CALCIUM  --   --  7.6*   < > 8.2* 8.8* 8.7*  PROT 6.3*  --  5.0*  --   --   --   --   ALBUMIN 3.5  --  3.3*  --   --   --   --   AST 18  --  127*  --   --   --   --   ALT 17  --  63*  --   --   --   --   ALKPHOS 78  --  43  --   --   --   --   BILITOT 0.4  --  1.1  --   --   --   --   GFRNONAA  --   --  >60   < > >60 >60 >60  ANIONGAP  --   --  7   < > '6 6 8   ' < > = values in this interval not displayed.     Hematology Recent Labs  Lab 04/25/20 0214 04/25/20 0221 04/25/20 0627 04/26/20 0441 04/28/20 0524  WBC 14.4*  --   --  14.9* 9.9  RBC 3.32*  --   --  3.33* 3.68*  HGB 10.1*   < > 9.9* 10.0* 11.2*  HCT 30.4*   < > 29.0* 30.8* 33.5*  MCV 91.6  --   --  92.5 91.0  MCH 30.4  --   --  30.0 30.4  MCHC 33.2  --   --  32.5 33.4  RDW 16.0*  --   --  15.6* 14.5  PLT 102*  --   --  116* 214   < > = values in this interval not displayed.    BNPNo results for input(s): BNP, PROBNP in the last 168 hours.   DDimer No results for input(s): DDIMER in the last 168 hours.   Radiology    DG CHEST PORT 1 VIEW  Result Date: 04/27/2020 CLINICAL DATA:   Status post CABG EXAM: PORTABLE CHEST 1 VIEW COMPARISON:  04/26/2020 FINDINGS: There is a right IJ catheter with tip in the SVC. Previous median sternotomy and CABG procedure. Heart size is enlarged. Diffuse interstitial and airspace opacities are noted throughout both lungs. Left pleural effusion and dense airspace opacification in the left base noted. Visualized osseous structures are unremarkable. IMPRESSION: Cardiomegaly and diffuse interstitial and airspace opacities throughout both lungs compatible with CHF. Electronically Signed   By: Kerby Moors M.D.   On: 04/27/2020 07:53    Cardiac Studies   Echo 04/22/20: IMPRESSIONS    1. Left ventricular ejection fraction, by estimation, is 55 to 60%. The  left ventricle has normal function. The left ventricle has regional wall  motion abnormalities. Mild hypokinesis of the left ventricular, basal  inferior wall and inferolateral wall.  Left ventricular diastolic parameters are consistent with Grade II  diastolic dysfunction (pseudonormalization). The average left ventricular  global longitudinal strain is -16.4 %. The global longitudinal strain is  mildly abnormal.  2. Right ventricular systolic function is normal. The right ventricular  size is normal. Tricuspid regurgitation signal is inadequate for assessing  PA pressure.  3. The mitral valve is normal in structure. No evidence of mitral valve  regurgitation. No evidence of mitral stenosis.  4. The aortic valve is normal in structure. Aortic valve regurgitation is  not visualized. No aortic stenosis is present.  5. The inferior vena  cava is normal in size with greater than 50%  respiratory variability, suggesting right atrial pressure of 3 mmHg.   Comparison(s): No significant change from prior study. Prior images  reviewed side by side.   Cardiac Cath: Conclusion    RPDA lesion is 95% stenosed.  Prox RCA-2 lesion is 95% stenosed.  Mid RCA lesion is 90%  stenosed.  Dist RCA lesion is 80% stenosed.  Prox RCA-1 lesion is 80% stenosed.  Prox RCA-3 lesion is 95% stenosed.  Ramus-1 lesion is 50% stenosed.  Ramus-2 lesion is 70% stenosed.  Prox Cx to Mid Cx lesion is 95% stenosed.  Mid Cx to Dist Cx lesion is 50% stenosed.  Dist Cx lesion is 80% stenosed with 80% stenosed side branch in 3rd Mrg.  Ost LAD to Prox LAD lesion is 60% stenosed.  Mid LAD lesion is 50% stenosed.  The left ventricular systolic function is normal.  LV end diastolic pressure is normal.   Severe multivessel CAD with focal 60% ostial LAD stenosis followed by 50% proximal to mid stenosis; 50 and 70% stenoses in the ramus intermediate vessel; tortuous left circumflex coronary artery with 95% proximal stenosis followed by 50, 95 and 80% stenoses in a diffusely diseased segment; and severe diffuse disease in the RCA with 80% proximal followed by diffuse 95% stenoses with bridging collaterals from the conus branch to the mid RCA and additional 90% and 80% stenoses with subtotal occlusion of the PDA.  There is retrograde filling of the PDA via the left coronary injection.  Normal LV function with EF estimated 60 to 65% without focal segmental wall motion abnormalities.  LVEDP ranging from 20 to 23 mm Hg.  RECOMMENDATION: Surgical consultation for CABG revascularization surgery.  I have recommended the patient stay in the hospital following her catheterization and be initiated on anti-ischemic medication with surgical consultation today and hopeful surgery this week.  If patient cannot have surgery the next several days she may be able to be be discharged until elective surgery can be scheduled.   Patient Profile     60 y.o. female with multivessel CAD admitted 4/19 after cardiac catheterization and underwent CABG 4/20.   Assessment & Plan    1. Multivessel CAD: POD #5 from CABG, management per TCTS team. On ASA/carvedilol/atorva 80 mg.  2. Post-operative  respiratory failure/acute on chronic diastolic heart failure. Pt has diuresed well over last 48 hours. Weight 74.8 kg on admission, 84.9 kg 4/21, now trended back down to 74 kg. Co-ox 67 this am. LVEF 55-60% on preop echo. Currently on losartan, carvedilol. Remains on high flow [place], O2 saturations 94-99.  Heart rhythm stable, sinus 94 bpm, tele reviewed no arrhythmia. Appears to be progressing well. Wean O2 as tolerated per primary team.   For questions or updates, please contact [place] Please consult [email or web address] for contact info under        Signed, Sherren Mocha, MD  04/28/2020, 7:20 AM

## 2020-04-28 NOTE — Progress Notes (Signed)
Pt no longer on HHFNC, just salter at 5LPM.  Pt tolerating well at this time.Marland Kitchen Cylinder on SB in room is needed.  RT will continue to monitor.

## 2020-04-28 NOTE — H&P (Addendum)
NAME:  Sabrina Swanson, MRN:  [id number], DOB:  Sep 04, 1960, LOS: 6 ADMISSION DATE:  04/22/2020, CONSULTATION DATE:  04/28/20 REFERRING MD: Dr. Orvan Seen , CHIEF COMPLAINT: Hypoxemia  History of Present Illness:  60 year old female POD #5 CABG with hx of CAD, HTN, hyperlipidemia, DM and smoker presenting with persistent hypoxemia since POD #2. Has had multiple episodes of desaturations to the mid 80s and placed on bipap POD #2. Has continued on HFNC with altering Fio2 levels to maintain adequate sats. Patient was volume up on exam (up 7lb from preop weight, bilateral LE and UE edema) and given lasix with adequate diuresis. Patient continues to be hypoxic and requiring HFNC '@40Fio2'  to maintain O2 sats >92. CXR has shown cardiomegaly and diffuse interstitial airspace opacities throughout both lungs but today shows ill defined airspace opacities with borderline enlarged heart.  Pertinent  Medical History  CAD HTN Hyperlipidemia DM Smoker   Significant Hospital Events: Including procedures, antibiotic start and stop dates in addition to other pertinent events   4/22: Initial episode of desat to 84/85. Started on HFNC '@15l'  then heated HF, then nonrebreather, then bipap. CXR showed Increased patchy density in both lower lobes could be due to atelectasis or pneumonia. Lasix started. 4/23: BiPAP dc'd. HFNC started @ 100 FiO2. CXR showed worsening aeration to the left midlung right midlung and right base. Antibiotics started  Procedures . Cardiac cath and angio 4/19 . CABG 4/20  Antibiotics . Vanc 4/23- . cefepime 4/23-  Interim History / Subjective:  Patient reports feeling less SOB today and has been able to get up and walk around with PT. She reports feeling sore along the incision and denies using IS or PRN pain medications. She reports the swelling in her hands and legs has come down from previous days.  4/25 CXR showed No pneumothorax. Elevation left hemidiaphragm with ill-defined airspace  opacity left base and suspected small left pleural effusion. Partial but incomplete clearing of airspace opacity right lower lung region. No new opacity evident. Stable cardiac silhouette.  Objective   Blood pressure (!) 141/84, pulse 84, temperature 98.4 F (36.9 C), temperature source Oral, resp. rate (!) 22, height '5\' 4"'  (1.626 m), weight 74 kg, SpO2 97 %.    FiO2 (%):  [40 %-70 %] 40 %   Intake/Output Summary (Last 24 hours) at 04/28/2020 1516 Last data filed at 04/28/2020 0700 Gross per 24 hour  Intake 692.67 ml  Output 1891 ml  Net -1198.33 ml   Filed Weights   04/26/20 0500 04/27/20 0500 04/28/20 0500  Weight: 75.9 kg 77.2 kg 74 kg    Examination: General: Well appearing. Resting comfortably in chair.  HENT: MMM. Lungs: NWOB. Fine crackles appreciated in RML, RLL, and LLL. Egophony appreciated in b/l lower lobes. Cardiovascular: RRR. No M/R/G appreciated. No JVD.  Chest: healing incision w/o erytheema, warmth or swelling. Significant tenderness to palpation of incision. Abdomen: NTND. BS throughout. Extremities: No edema. 2+ bilateral dorsalis pedis pulses.  Labs/imaging that I havepersonally reviewed  (right click and "Reselect all SmartList Selections" daily)  CLINICAL DATA:  Status post coronary artery bypass grafting with central catheter in place  EXAM: PORTABLE CHEST 1 VIEW  COMPARISON:  April 27, 2020  FINDINGS: Central catheter tip in superior vena cava. No pneumothorax. Persistent elevation left hemidiaphragm with ill-defined airspace opacity left base. Suspected small left pleural effusion. Ill-defined opacity in the right base region, less than 1 day prior. No new opacity evident. Heart is borderline enlarged with pulmonary vascularity normal.  No adenopathy. Status post internal mammary bypass grafting. No bone lesions.  IMPRESSION: Central catheter tip in superior vena cava. No pneumothorax. Elevation left hemidiaphragm with ill-defined airspace  opacity left base and suspected small left pleural effusion. Partial but incomplete clearing of airspace opacity right lower lung region. No new opacity evident. Stable cardiac silhouette.   Electronically Signed   By: Lowella Grip III M.D.   On: 04/28/2020 08:32  Resolved Hospital Problem list     Assessment & Plan:  Acute Post-Op Hypoxemic Respiratory Distress Patient now post op day 5 from CABG with continued hypoxemia and evidence of airspace opacities in bilateral lungs. Given patients continued hypoxemia despite HFNC, recent surgery, continued markings on CXR, tenderness of chest wall to palpation and with deep breath and egophony on exam, and lack of IS use, suspect this is likely due to atelectasis. This is supported by lack of fever and evidence of minimal lung shifting and B-lines on [place]. Would recommend CTA imaging to rule out PE as given this is POD #5 and the patient continues to be hypoxemic despite diuretic and antibiotic initiation several days ago. Pulmonary edema less likely given the patient has diuresed -10L over the past three days. Pneumonia possible although lung findings more suggestive of lung collapse rather than consolidation.  - Continue to wean O2 - Schedule tylenol and lidocaine patch to promote deeper breathing - Continue to encourage using IS every hour x10 puffs - Continue to encourage patient up and walking - Would recommend CTA to r/o PE  -if negative for PE and no AKI--> can consider giving ketorolac - Can continue antibiotics until day 7  Best practice (right click and "Reselect all SmartList Selections" daily)  Diet:  Oral Pain/Anxiety/Delirium protocol (if indicated): No VAP protocol (if indicated): Not indicated DVT prophylaxis: OOB GI prophylaxis: PPI Glucose control:  SSI Yes Central venous access:  N/A Arterial line:  N/A Foley:  N/A Mobility:  OOB  PT consulted: Yes Code Status:  full code Disposition: S/p CABG. Continue to try to  resolve hypoxemia.  Labs   CBC: Recent Labs  Lab 04/24/20 0500 04/24/20 1347 04/24/20 1601 04/25/20 0110 04/25/20 0214 04/25/20 0221 04/25/20 0627 04/26/20 0441 04/28/20 0524  WBC 13.0*  --  12.8*  --  14.4*  --   --  14.9* 9.9  HGB 10.5*   < > 9.9*   < > 10.1* 10.2* 9.9* 10.0* 11.2*  HCT 31.2*   < > 29.9*   < > 30.4* 30.0* 29.0* 30.8* 33.5*  MCV 90.7  --  91.2  --  91.6  --   --  92.5 91.0  PLT 112*  --  98*  --  102*  --   --  116* 214   < > = values in this interval not displayed.    Basic Metabolic Panel: Recent Labs  Lab 04/23/20 2200 04/24/20 0454 04/24/20 0500 04/24/20 1347 04/24/20 1601 04/25/20 0110 04/25/20 0214 04/25/20 0221 04/25/20 0627 04/26/20 0441 04/27/20 0339 04/28/20 0524  NA 141   < > 142   < > 141   < > 139 143 143 138 138 140  K 4.6   < > 3.9   < > 4.4   < > 3.8 3.9 3.9 3.6 3.7 3.3*  CL 110  --  111  --  112*  --  109  --   --  103 105 104  CO2 24  --  25  --  24  --  24  --   --  '29 27 28  ' GLUCOSE 168*  --  129*  --  117*  --  134*  --   --  123* 112* 145*  BUN 10  --  10  --  11  --  12  --   --  '15 14 14  ' CREATININE 1.03*  --  0.99  --  0.88  --  0.83  --   --  0.84 0.64 0.74  CALCIUM 7.6*  --  7.7*  --  7.5*  --  7.7*  --   --  8.2* 8.8* 8.7*  MG 2.9*  --  2.5*  --  2.4  --   --   --   --   --   --   --    < > = values in this interval not displayed.   GFR: Estimated Creatinine Clearance: 73.7 mL/min (by C-G formula based on SCr of 0.74 mg/dL). Recent Labs  Lab 04/24/20 1601 04/25/20 0214 04/26/20 0441 04/28/20 0524  WBC 12.8* 14.4* 14.9* 9.9    Liver Function Tests: Recent Labs  Lab 04/23/20 0512 04/23/20 2200  AST 18 127*  ALT 17 63*  ALKPHOS 78 43  BILITOT 0.4 1.1  PROT 6.3* 5.0*  ALBUMIN 3.5 3.3*   No results for input(s): LIPASE, AMYLASE in the last 168 hours. No results for input(s): AMMONIA in the last 168 hours.  ABG    Component Value Date/Time   PHART 7.404 04/25/2020 0627   PCO2ART 36.4 04/25/2020 0627    PO2ART 78 (L) 04/25/2020 0627   HCO3 22.8 04/25/2020 0627   TCO2 24 04/25/2020 0627   ACIDBASEDEF 2.0 04/25/2020 0627   O2SAT 67.4 04/28/2020 0524     Coagulation Profile: Recent Labs  Lab 04/23/20 0512 04/23/20 1609  INR 1.0 1.6*    Cardiac Enzymes: No results for input(s): CKTOTAL, CKMB, CKMBINDEX, TROPONINI in the last 168 hours.  HbA1C: Hgb A1c MFr Bld  Date/Time Value Ref Range Status  04/23/2020 03:39 AM 6.9 (H) 4.8 - 5.6 % Final    Comment:    (NOTE) Pre diabetes:          5.7%-6.4%  Diabetes:              >6.4%  Glycemic control for   <7.0% adults with diabetes     CBG: Recent Labs  Lab 04/27/20 1938 04/27/20 2332 04/28/20 0351 04/28/20 0630 04/28/20 1111  GLUCAP 137* 110* 124* 128* 88    Review of Systems:   HEENT: Denies HA, changes in vision, tinnitus, dizziness Chest: Denies sharp or radiating chest pain, denies chest pressure, denies SOB ABD: denies abd pain, denies diarrhea or constipation, denies bloating Extremities: denies swelling  Past Medical History:  She,  has a past medical history of Acquired trigger finger (06/10/2012), Alopecia (05/16/2013), Angina pectoris ([place]) (11/13/2019), Anxiety state (06/10/2012), Arthritis of right acromioclavicular joint (11/27/2018), Back pain, Benign neoplasm of colon (06/10/2012), Bursitis disorder (05/11/2011), Cardiac murmur (11/13/2019), Carpal tunnel syndrome (06/10/2012), Chalazion right upper eyelid (06/30/2015), Continuous dependence on cigarette smoking (11/13/2019), COPD (chronic obstructive pulmonary disease) ([place]) (05/16/2013), DDD (degenerative disc disease), lumbosacral (06/10/2012), Depressive disorder (06/10/2012), Diabetes mellitus without complication ([place]), Dyspareunia (03/04/2014), Dysthymia (06/10/2012), Encounter for long-term (current) use of other medications (06/10/2012), Essential hypertension (08/14/2019), Generalized anxiety disorder (06/10/2012), Hammer toe of right foot (10/23/2014), High cholesterol,  Hypercholesterolemia (03/06/2013), Hypertension, Insomnia (06/10/2012), Lumbosacral spondylosis (06/10/2012), Lump of skin (10/23/2014), Migraine syndrome (06/10/2012), Numbness of toes (10/23/2014), Osteoarthrosis, generalized,  involving multiple sites (06/10/2012), Pain in soft tissues of limb (06/10/2012), Pain in toes of both feet (10/23/2014), Patellar tendinitis (06/10/2012), Persistent lymphocytosis (05/25/2014), Plantar fascial fibromatosis (06/10/2012), Risk for falls (03/05/2015), Sensorineural hearing loss (06/10/2012), Shoulder impingement, right (11/27/2018), Subacute vaginitis (07/18/2015), Tendinopathy of rotator cuff, right (11/27/2018), Tinnitus (03/21/2013), Tobacco use disorder (03/06/2013), Type 2 diabetes, controlled, with neuropathy ([place]) (10/21/2014), Unspecified cataract (06/30/2015), Vertigo (06/14/2013), and Vestibular dizziness (06/10/2012).   Surgical History:   Past Surgical History:  Procedure Laterality Date  . ABDOMINAL HYSTERECTOMY    . CESAREAN SECTION    . CORONARY ARTERY BYPASS GRAFT N/A 04/23/2020   Procedure: CORONARY ARTERY BYPASS GRAFTING (CABG), ON PUMP, TIMES THREE, USING LEFT INTERNAL MAMMARY ARTERY AND ENDOSCOPICALLY HARVESTED RIGHT GREATER SAPHENOUS VEIN;  Surgeon: [name], MD;  Location: [facility];  Service: Open Heart Surgery;  Laterality: N/A;  . LEFT HEART CATH AND CORONARY ANGIOGRAPHY N/A 04/22/2020   Procedure: LEFT HEART CATH AND CORONARY ANGIOGRAPHY;  Surgeon: Troy Sine, MD;  Location: [facility];  Service: Cardiovascular;  Laterality: N/A;  . TEE WITHOUT CARDIOVERSION N/A 04/23/2020   Procedure: TRANSESOPHAGEAL ECHOCARDIOGRAM (TEE);  Surgeon: [name], MD;  Location: [facility];  Service: Open Heart Surgery;  Laterality: N/A;     Social History:   reports that she has been smoking cigarettes. She has been smoking about 1.00 pack per day. She has never used smokeless tobacco. She reports current alcohol use. She reports that she does not use drugs.    Family History:  Her family history includes Diabetes in her maternal grandfather; Hypertension in her maternal grandmother; Stomach cancer in her mother.   Allergies Allergies  Allergen Reactions  . Acetaminophen Itching and Nausea Only     Home Medications  Prior to Admission medications   Medication Sig Start Date End Date Taking? Authorizing Provider  albuterol (VENTOLIN HFA) 108 (90 Base) MCG/ACT inhaler Inhale 2 puffs into the lungs every 4 (four) hours as needed for shortness of breath. 07/19/16  Yes [provider]  aspirin 81 MG EC tablet Take 81 mg by mouth daily.   Yes [provider]  ergocalciferol (VITAMIN D2) 1.25 MG (50000 UT) capsule Take 50,000 Units by mouth every 7 (seven) days. 01/23/19  Yes [provider]  Ipratropium-Albuterol (COMBIVENT) 20-100 MCG/ACT AERS respimat Inhale 1 puff into the lungs daily in the afternoon. 07/19/16  Yes [provider]  losartan (COZAAR) 25 MG tablet Take 1 tablet (25 mg total) by mouth in the morning and at bedtime. 04/16/20 07/15/20 Yes Tobb, Kardie, DO  metFORMIN (GLUCOPHAGE-XR) 500 MG 24 hr tablet Take 500 mg by mouth daily. 10/17/19  Yes [provider]  NARCAN 4 MG/0.1ML LIQD nasal spray kit Place 4 mg into the nose once. 11/22/19  Yes [provider]  nitroGLYCERIN (NITROSTAT) 0.4 MG SL tablet Place 0.4 mg under the tongue every 5 (five) minutes as needed for chest pain.   Yes [provider]  Omega-3 Fatty Acids (FISH OIL) 1000 MG CAPS Take 1,000 mg by mouth daily.   Yes [provider]  OxyCODONE (OXYCONTIN) 10 mg T12A 12 hr tablet Take 10 mg by mouth every 12 (twelve) hours as needed (pain).   Yes [provider]  vitamin B-12 (CYANOCOBALAMIN) 1000 MCG tablet Take 1,000 mcg by mouth daily.   Yes [provider]     Critical care time: 40 minutes    Georgeanna Lea, MS4

## 2020-04-29 ENCOUNTER — Inpatient Hospital Stay (HOSPITAL_COMMUNITY): Payer: Medicare Other

## 2020-04-29 ENCOUNTER — Other Ambulatory Visit: Payer: Self-pay | Admitting: Cardiothoracic Surgery

## 2020-04-29 DIAGNOSIS — Z951 Presence of aortocoronary bypass graft: Secondary | ICD-10-CM

## 2020-04-29 LAB — GLUCOSE, CAPILLARY
Glucose-Capillary: 189 mg/dL — ABNORMAL HIGH (ref 70–99)
Glucose-Capillary: 86 mg/dL (ref 70–99)
Glucose-Capillary: 163 mg/dL — ABNORMAL HIGH (ref 70–99)
Glucose-Capillary: 117 mg/dL — ABNORMAL HIGH (ref 70–99)

## 2020-04-29 LAB — COOXEMETRY PANEL
Carboxyhemoglobin: 0.7 % (ref 0.5–1.5)
Methemoglobin: 0.7 % (ref 0.0–1.5)
O2 Saturation: 68.7 %
Total hemoglobin: 15.2 g/dL (ref 12.0–16.0)

## 2020-04-29 LAB — CBC
HCT: 32.4 % — ABNORMAL LOW (ref 36.0–46.0)
Hemoglobin: 10.6 g/dL — ABNORMAL LOW (ref 12.0–15.0)
MCH: 29.9 pg (ref 26.0–34.0)
MCHC: 32.7 g/dL (ref 30.0–36.0)
MCV: 91.3 fL (ref 80.0–100.0)
Platelets: 217 10*3/uL (ref 150–400)
RBC: 3.55 MIL/uL — ABNORMAL LOW (ref 3.87–5.11)
RDW: 14.4 % (ref 11.5–15.5)
WBC: 8.7 10*3/uL (ref 4.0–10.5)
nRBC: 0 % (ref 0.0–0.2)

## 2020-04-29 LAB — BASIC METABOLIC PANEL
Anion gap: 8 (ref 5–15)
BUN: 12 mg/dL (ref 6–20)
CO2: 27 mmol/L (ref 22–32)
Calcium: 8.5 mg/dL — ABNORMAL LOW (ref 8.9–10.3)
Chloride: 104 mmol/L (ref 98–111)
Creatinine, Ser: 0.7 mg/dL (ref 0.44–1.00)
GFR, Estimated: >60 mL/min (ref >60)
Glucose, Bld: 98 mg/dL (ref 70–99)
Potassium: 3.7 mmol/L (ref 3.5–5.1)
Sodium: 139 mmol/L (ref 135–145)

## 2020-04-29 MED ORDER — POTASSIUM CHLORIDE CRYS ER 20 MEQ PO TBCR
20.0000 meq | EXTENDED_RELEASE_TABLET | ORAL | Status: AC
  Administered 2020-04-29 (×3): 20 meq via ORAL
  Filled 2020-04-29 (×3): qty 1

## 2020-04-29 MED ORDER — ORAL CARE MOUTH RINSE
15.0000 mL | Freq: Two times a day (BID) | OROMUCOSAL | Status: DC
  Administered 2020-04-30: 15 mL via OROMUCOSAL

## 2020-04-29 NOTE — Progress Notes (Signed)
NAME:  Sabrina Swanson, MRN:  [id number], DOB:  08-19-1960, LOS: 7 ADMISSION DATE:  04/22/2020, CONSULTATION DATE: 04/28/20 REFERRING MD:  Dr. Orvan Seen, CHIEF COMPLAINT: Hypoxia  History of Present Illness:  60 year old female POD #6 CABGx3 with hx of CAD, HTN, hyperlipidemia, DM and smoker presenting with persistent hypoxemia since POD #2. Has had multiple episodes of desaturations to the mid 80s and placed on bipap POD #2. Was continued on HFNC with altering Fio2 levels to maintain adequate sats. Patient was volume up on exam (up 7lb from preop weight, bilateral LE and UE edema) and given lasix with adequate diuresis. Patient continues to be hypoxic and requiring HFNC to maintain O2 sats >92. CXR has shown cardiomegaly and diffuse interstitial airspace opacities throughout both lungs but over the last several days is stable showing ill defined airspace opacities with borderline enlarged heart.  Pertinent  Medical History  CAD HTN Hyperlipidemia DM Smoker   Significant Hospital Events: Including procedures, antibiotic start and stop dates in addition to other pertinent events   4/22: Initial episode of desat to 84/85. Started on HFNC _0  then heated HF, then nonrebreather, then bipap. CXR showed Increased patchy density in both lower lobes could be due to atelectasis or pneumonia. Lasix started. 4/23: BiPAP dc'd. HFNC started @ 100 FiO2. CXR showed worsening aeration to the left midlung right midlung and right base. Antibiotics started 4/25: CT w/o evidence of PE. Hypoxia improving. Transitioned off of HFNC and maintaining 02 sats >95 on 4L [place].  Procedures  Cardiac cath and angio 4/19  CABGx3 4/20  Antibiotics  Vanc 4/23-  cefepime 4/23-  Interim History / Subjective:  Patient weaned to 4L [place] from HFNC and maintaining sats. Patient reports taking deeper breaths and decreased pain with addition of scheduled analgesics. Has been up walking around. Reports using IS every hour.   Patient on scheduled analgesics and not requiring PRN medication.  Objective   Blood pressure (!) 142/88, pulse 88, temperature 97.6 F (36.4 C), temperature source Oral, resp. rate (!) 29, height 5' 4" (1.626 m), weight 74.7 kg, SpO2 98 %.        Intake/Output Summary (Last 24 hours) at 04/29/2020 1206 Last data filed at 04/29/2020 1100 Gross per 24 hour  Intake 1344.64 ml  Output 1355 ml  Net -10.36 ml   Filed Weights   04/27/20 0500 04/28/20 0500 04/29/20 0700  Weight: 77.2 kg 74 kg 74.7 kg    Examination: General: Well-appearing, conversational. Resting comfortably in bed. No increased WOB. HENT: MMM. Trachea midline.  Lungs: Vesicular breath sounds in RUL,RML, and LUL. Bronchial breath sounds of bilateral lower lobes. Egophony of left lower lobe. Pulling 528m on IS. Cardiovascular: RRR. No M/R/G. No JVD. Abdomen: NTND. BS throughout. Extremities: No edema.  Labs/imaging that I havepersonally reviewed  (right click and "Reselect all SmartList Selections" daily)  CLINICAL DATA:  60year old female with concern for pulmonary embolism.  EXAM: CT ANGIOGRAPHY CHEST WITH CONTRAST  TECHNIQUE: Multidetector CT imaging of the chest was performed using the standard protocol during bolus administration of intravenous contrast. Multiplanar CT image reconstructions and MIPs were obtained to evaluate the vascular anatomy.  CONTRAST:  864mOMNIPAQUE IOHEXOL 300 MG/ML  SOLN  COMPARISON:  Chest radiograph dated 04/28/2020.  FINDINGS: Cardiovascular: Mild cardiomegaly. Pericardial effusion measuring approximately up to 7 mm in thickness. There is retrograde flow of contrast from the right atrium into the IVC suggestive of a degree of right heart dysfunction. Correlation with clinical exam and echocardiogram recommended.  There is coronary vascular calcification and postsurgical changes of CABG. Mild atherosclerotic calcification of the thoracic aorta. Evaluation of the  pulmonary arteries is limited due to respiratory motion artifact. No large or central pulmonary artery embolus identified.  Mediastinum/Nodes: No definite hilar adenopathy. Evaluation is however limited due to consolidative changes of the adjacent lungs. Subcarinal lymph node measures 12 mm in short axis. The esophagus is grossly unremarkable. No mediastinal fluid collection.  Lungs/Pleura: Small left pleural effusion. There is consolidative changes of the majority of the left lower lobe. Bilateral patchy ground-glass and streaky densities concerning for pneumonia. Clinical correlation and follow-up recommended. There is no pneumothorax. The central airways are patent.  Upper Abdomen: No acute abnormality.  Musculoskeletal: Mild degenerative changes of the spine. Median sternotomy wires. No acute osseous pathology.  Review of the MIP images confirms the above findings.  IMPRESSION: 1. No CT evidence of central pulmonary artery embolus. 2. Small left pleural effusion with consolidative changes of the majority of the left lower lobe. Bilateral patchy ground-glass and streaky densities concerning for multilobar pneumonia. Clinical correlation and follow-up recommended. 3. Mild cardiomegaly with small pericardial effusion. Correlation with clinical exam and echocardiogram recommended. 4. Aortic Atherosclerosis (ICD10-I70.0).   Electronically Signed   By: Anner Crete M.D.   On: 04/28/2020 22:41     CLINICAL DATA:  Respiratory distress  EXAM: CHEST - 2 VIEW  COMPARISON:  04/28/2020, CT 04/28/2020  FINDINGS: The lungs are well expanded and pulmonary insufflation is stable when compared to prior examination. Right internal jugular Cordis introducer is unchanged. Retrocardiac opacification related to left basilar consolidation is unchanged. Small left pleural effusion is likely present. Right basilar infiltrate is stable. Coronary artery bypass grafting  has been performed. Cardiac size is mildly enlarged. Pulmonary vascularity is stable. No acute bone abnormality.  IMPRESSION: Stable retrocardiac consolidation and right basilar focal pulmonary infiltrate. Small left pleural effusions suspected.  Stable cardiomegaly.   Electronically Signed   By: Fidela Salisbury MD   On: 04/29/2020 06:30  Resolved Hospital Problem list     Assessment & Plan:  Acute Post-Op Hypoxemic Respiratory Insufficiency Patient now post op day 6 from CABG with improving hypoxemia and improving airspace opacities on imaging. Clinical status improving with transition off of HFNC and pain control to support deeper breathing with improved IS from 250 mL to 500 mL. Exam still suggestive of consolidation in Left lower lobe. CT r/o PE and suggests LLL consolidation. - Continue to wean O2 - Continue ACT APAP, gabapentin and lidocaine patch - Continue to encourage using IS every hour x10 puffs - Continue to encourage patient up and walking - Can continue antibiotics until day 7 (vanc and cefepime on day 4 today)  Best practice (right click and "Reselect all SmartList Selections" daily)  Diet:  Oral Pain/Anxiety/Delirium protocol (if indicated): No VAP protocol (if indicated): Not indicated DVT prophylaxis: n/a GI prophylaxis: PPI Glucose control:  SSI Yes Central venous access:  N/A Arterial line:  N/A Foley:  N/A Mobility:  OOB  PT consulted: Yes Code Status:  full code Disposition: Improving hypoxia  Labs   CBC: Recent Labs  Lab 04/24/20 1601 04/25/20 0110 04/25/20 0214 04/25/20 0221 04/25/20 0627 04/26/20 0441 04/28/20 0524 04/29/20 0334  WBC 12.8*  --  14.4*  --   --  14.9* 9.9 8.7  HGB 9.9*   < > 10.1* 10.2* 9.9* 10.0* 11.2* 10.6*  HCT 29.9*   < > 30.4* 30.0* 29.0* 30.8* 33.5* 32.4*  MCV 91.2  --  91.6  --   --  92.5 91.0 91.3  PLT 98*  --  102*  --   --  116* 214 217   < > = values in this interval not displayed.    Basic Metabolic  Panel: Recent Labs  Lab 04/23/20 2200 04/24/20 0454 04/24/20 0500 04/24/20 1347 04/24/20 1601 04/25/20 0110 04/25/20 0214 04/25/20 0221 04/25/20 5397 04/26/20 0441 04/27/20 0339 04/28/20 0524 04/29/20 0334  NA 141   < > 142   < > 141   < > 139   < > 143 138 138 140 139  K 4.6   < > 3.9   < > 4.4   < > 3.8   < > 3.9 3.6 3.7 3.3* 3.7  CL 110  --  111  --  112*  --  109  --   --  103 105 104 104  CO2 24  --  25  --  24  --  24  --   --  _0 GLUCOSE 168*  --  129*  --  117*  --  134*  --   --  123* 112* 145* 98  BUN 10  --  10  --  11  --  12  --   --  _1 CREATININE 1.03*  --  0.99  --  0.88  --  0.83  --   --  0.84 0.64 0.74 0.70  CALCIUM 7.6*  --  7.7*  --  7.5*  --  7.7*  --   --  8.2* 8.8* 8.7* 8.5*  MG 2.9*  --  2.5*  --  2.4  --   --   --   --   --   --   --   --    < > = values in this interval not displayed.   GFR: Estimated Creatinine Clearance: 74 mL/min (by C-G formula based on SCr of 0.7 mg/dL). Recent Labs  Lab 04/25/20 0214 04/26/20 0441 04/28/20 0524 04/29/20 0334  WBC 14.4* 14.9* 9.9 8.7    Liver Function Tests: Recent Labs  Lab 04/23/20 0512 04/23/20 2200  AST 18 127*  ALT 17 63*  ALKPHOS 78 43  BILITOT 0.4 1.1  PROT 6.3* 5.0*  ALBUMIN 3.5 3.3*   No results for input(s): LIPASE, AMYLASE in the last 168 hours. No results for input(s): AMMONIA in the last 168 hours.  ABG    Component Value Date/Time   PHART 7.404 04/25/2020 0627   PCO2ART 36.4 04/25/2020 0627   PO2ART 78 (L) 04/25/2020 0627   HCO3 22.8 04/25/2020 0627   TCO2 24 04/25/2020 0627   ACIDBASEDEF 2.0 04/25/2020 0627   O2SAT 68.7 04/29/2020 0334     Coagulation Profile: Recent Labs  Lab 04/23/20 0512 04/23/20 1609  INR 1.0 1.6*    Cardiac Enzymes: No results for input(s): CKTOTAL, CKMB, CKMBINDEX, TROPONINI in the last 168 hours.  HbA1C: Hgb A1c MFr Bld  Date/Time Value Ref Range Status  04/23/2020 03:39 AM 6.9 (H) 4.8 - 5.6 % Final    Comment:     (NOTE) Pre diabetes:          5.7%-6.4%  Diabetes:              >6.4%  Glycemic control for   <7.0% adults with diabetes     CBG: Recent Labs  Lab 04/28/20 1111 04/28/20 1528 04/28/20 2122 04/29/20 0822 04/29/20 1147  GLUCAP 88 139*  116* 189* 86    Review of Systems:   HEENT: Denies HA, changes in vision, tinnitus, dizziness. lightheadedness Chest: Denies sharp or radiating chest pain, denies chest pressure, denies SOB, denies palpitations ABD: denies abd pain, denies diarrhea or constipation, denies bloating, denies change in UOP Extremities: denies swelling, numbness or tingling  Past Medical History:  She,  has a past medical history of Acquired trigger finger (06/10/2012), Alopecia (05/16/2013), Angina pectoris ([place]) (11/13/2019), Anxiety state (06/10/2012), Arthritis of right acromioclavicular joint (11/27/2018), Back pain, Benign neoplasm of colon (06/10/2012), Bursitis disorder (05/11/2011), Cardiac murmur (11/13/2019), Carpal tunnel syndrome (06/10/2012), Chalazion right upper eyelid (06/30/2015), Continuous dependence on cigarette smoking (11/13/2019), COPD (chronic obstructive pulmonary disease) ([place]) (05/16/2013), DDD (degenerative disc disease), lumbosacral (06/10/2012), Depressive disorder (06/10/2012), Diabetes mellitus without complication ([place]), Dyspareunia (03/04/2014), Dysthymia (06/10/2012), Encounter for long-term (current) use of other medications (06/10/2012), Essential hypertension (08/14/2019), Generalized anxiety disorder (06/10/2012), Hammer toe of right foot (10/23/2014), High cholesterol, Hypercholesterolemia (03/06/2013), Hypertension, Insomnia (06/10/2012), Lumbosacral spondylosis (06/10/2012), Lump of skin (10/23/2014), Migraine syndrome (06/10/2012), Numbness of toes (10/23/2014), Osteoarthrosis, generalized, involving multiple sites (06/10/2012), Pain in soft tissues of limb (06/10/2012), Pain in toes of both feet (10/23/2014), Patellar tendinitis (06/10/2012), Persistent lymphocytosis  (05/25/2014), Plantar fascial fibromatosis (06/10/2012), Risk for falls (03/05/2015), Sensorineural hearing loss (06/10/2012), Shoulder impingement, right (11/27/2018), Subacute vaginitis (07/18/2015), Tendinopathy of rotator cuff, right (11/27/2018), Tinnitus (03/21/2013), Tobacco use disorder (03/06/2013), Type 2 diabetes, controlled, 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   Surgical History:   Past Surgical History:  Procedure Laterality Date  . ABDOMINAL HYSTERECTOMY    . CESAREAN SECTION    . CORONARY ARTERY BYPASS GRAFT N/A 04/23/2020   Procedure: CORONARY ARTERY BYPASS GRAFTING (CABG), ON PUMP, TIMES THREE, USING LEFT INTERNAL MAMMARY ARTERY AND ENDOSCOPICALLY HARVESTED RIGHT GREATER SAPHENOUS VEIN;  Surgeon: [name], MD;  Location: [facility];  Service: Open Heart Surgery;  Laterality: N/A;  . LEFT HEART CATH AND CORONARY ANGIOGRAPHY N/A 04/22/2020   Procedure: LEFT HEART CATH AND CORONARY ANGIOGRAPHY;  Surgeon: Troy Sine, MD;  Location: [facility];  Service: Cardiovascular;  Laterality: N/A;  . TEE WITHOUT CARDIOVERSION N/A 04/23/2020   Procedure: TRANSESOPHAGEAL ECHOCARDIOGRAM (TEE);  Surgeon: [name], MD;  Location: [facility];  Service: Open Heart Surgery;  Laterality: N/A;     Social History:   reports that she has been smoking cigarettes. She has been smoking about 1.00 pack per day. She has never used smokeless tobacco. She reports current alcohol use. She reports that she does not use drugs.   Family History:  Her family history includes Diabetes in her maternal grandfather; Hypertension in her maternal grandmother; Stomach cancer in her mother.   Allergies Allergies  Allergen Reactions  . Acetaminophen Itching and Nausea Only     Home Medications  Prior to Admission medications   Medication Sig Start Date End Date Taking? Authorizing Provider  albuterol (VENTOLIN HFA) 108  (90 Base) MCG/ACT inhaler Inhale 2 puffs into the lungs every 4 (four) hours as needed for shortness of breath. 07/19/16  Yes [provider]  aspirin 81 MG EC tablet Take 81 mg by mouth daily.   Yes [provider]  ergocalciferol (VITAMIN D2) 1.25 MG (50000 UT) capsule Take 50,000 Units by mouth every 7 (seven) days. 01/23/19  Yes [provider]  Ipratropium-Albuterol (COMBIVENT) 20-100 MCG/ACT AERS respimat Inhale 1 puff into the lungs daily in the afternoon. 07/19/16  Yes [provider]  losartan (COZAAR) 25 MG tablet Take 1 tablet (25 mg total) by mouth in the morning and at bedtime. 04/16/20 07/15/20 Yes Tobb, Kardie, DO  metFORMIN (GLUCOPHAGE-XR) 500 MG 24 hr tablet Take 500 mg by mouth daily. 10/17/19  Yes [provider]  NARCAN 4 MG/0.1ML LIQD nasal spray kit Place 4 mg into the nose once. 11/22/19  Yes [provider]  nitroGLYCERIN (NITROSTAT) 0.4 MG SL tablet Place 0.4 mg under the tongue every 5 (five) minutes as needed for chest pain.   Yes [provider]  Omega-3 Fatty Acids (FISH OIL) 1000 MG CAPS Take 1,000 mg by mouth daily.   Yes [provider]  OxyCODONE (OXYCONTIN) 10 mg T12A 12 hr tablet Take 10 mg by mouth every 12 (twelve) hours as needed (pain).   Yes [provider]  vitamin B-12 (CYANOCOBALAMIN) 1000 MCG tablet Take 1,000 mcg by mouth daily.   Yes [provider]     Critical care time: 30 mins

## 2020-04-29 NOTE — Progress Notes (Signed)
CARDIAC REHAB PHASE I   Went to offer to walk with pt. Pt just received lunch. Helped pt set-up tray, and not what pt ordered. Pt given crackers along with frozen meal. Encouraged continued IS use, walks, and nutrition. Will continue to follow.  [facility], RN BSN 04/29/2020 3:08 PM

## 2020-04-29 NOTE — Plan of Care (Signed)
  Problem: Activity: Goal: Ability to return to baseline activity level will improve Outcome: Progressing   Problem: Cardiovascular: Goal: Ability to achieve and maintain adequate cardiovascular perfusion will improve Outcome: Progressing   Problem: Health Behavior/Discharge Planning: Goal: Ability to safely manage health-related needs after discharge will improve Outcome: Progressing   Problem: Education: Goal: Knowledge of General Education information will improve Description: Including pain rating scale, medication(s)/side effects and non-pharmacologic comfort measures Outcome: Progressing   Problem: Clinical Measurements: Goal: Ability to maintain clinical measurements within normal limits will improve Outcome: Progressing Goal: Will remain free from infection Outcome: Progressing Goal: Diagnostic test results will improve Outcome: Progressing Goal: Respiratory complications will improve Outcome: Progressing Goal: Cardiovascular complication will be avoided Outcome: Progressing   Problem: Activity: Goal: Risk for activity intolerance will decrease Outcome: Progressing   Problem: Nutrition: Goal: Adequate nutrition will be maintained Outcome: Progressing   Problem: Coping: Goal: Level of anxiety will decrease Outcome: Progressing   Problem: Elimination: Goal: Will not experience complications related to bowel motility Outcome: Progressing Goal: Will not experience complications related to urinary retention Outcome: Progressing   Problem: Pain Managment: Goal: General experience of comfort will improve Outcome: Progressing   Problem: Safety: Goal: Ability to remain free from injury will improve Outcome: Progressing   Problem: Skin Integrity: Goal: Risk for impaired skin integrity will decrease Outcome: Progressing   Problem: Education: Goal: Will demonstrate proper wound care and an understanding of methods to prevent future damage Outcome:  Progressing Goal: Knowledge of disease or condition will improve Outcome: Progressing Goal: Knowledge of the prescribed therapeutic regimen will improve Outcome: Progressing Goal: Individualized Educational Video(s) Outcome: Progressing   Problem: Cardiac: Goal: Will achieve and/or maintain hemodynamic stability Outcome: Progressing   Problem: Respiratory: Goal: Respiratory status will improve Outcome: Progressing   Problem: Skin Integrity: Goal: Wound healing without signs and symptoms of infection Outcome: Progressing Goal: Risk for impaired skin integrity will decrease Outcome: Progressing   Problem: Urinary Elimination: Goal: Ability to achieve and maintain adequate renal perfusion and functioning will improve Outcome: Progressing

## 2020-04-29 NOTE — Progress Notes (Signed)
6 Days Post-Op Procedure(s) (LRB): CORONARY ARTERY BYPASS GRAFTING (CABG), ON PUMP, TIMES THREE, USING LEFT INTERNAL MAMMARY ARTERY AND ENDOSCOPICALLY HARVESTED RIGHT GREATER SAPHENOUS VEIN (N/A) INDOCYANINE GREEN FLUORESCENCE IMAGING (ICG) (N/A) TRANSESOPHAGEAL ECHOCARDIOGRAM (TEE) (N/A) Subjective: Feeling better  Objective: Vital signs in last 24 hours: Temp:  [97.8 F (36.6 C)-99.3 F (37.4 C)] 97.9 F (36.6 C) (04/26 0331) Pulse Rate:  [79-95] 88 (04/26 0700) Cardiac Rhythm: Normal sinus rhythm (04/26 0400) Resp:  [14-82] 25 (04/26 0700) BP: (111-158)/(75-107) 154/93 (04/26 0700) SpO2:  [85 %-100 %] 97 % (04/26 0700) FiO2 (%):  [40 %] 40 % (04/25 0818) Weight:  [74.7 kg] 74.7 kg (04/26 0700)  Hemodynamic parameters for last 24 hours:    Intake/Output from previous day: 04/25 0701 - 04/26 0700 In: 1062.2 [P.O.:240; I.V.:181.4; IV Piggyback:640.8] Out: 1355 [Urine:1355] Intake/Output this shift: No intake/output data recorded.  General appearance: alert and cooperative Neurologic: intact Heart: regular rate and rhythm, S1, S2 normal, no murmur, click, rub or gallop Lungs: clear to auscultation bilaterally Abdomen: soft, non-tender; bowel sounds normal; no masses,  no organomegaly Extremities: extremities normal, atraumatic, no cyanosis or edema Wound: c/d/i  Lab Results: Recent Labs    04/28/20 0524 04/29/20 0334  WBC 9.9 8.7  HGB 11.2* 10.6*  HCT 33.5* 32.4*  PLT 214 217   BMET:  Recent Labs    04/28/20 0524 04/29/20 0334  NA 140 139  K 3.3* 3.7  CL 104 104  CO2 28 27  GLUCOSE 145* 98  BUN 14 12  CREATININE 0.74 0.70  CALCIUM 8.7* 8.5*    PT/INR: No results for input(s): LABPROT, INR in the last 72 hours. ABG    Component Value Date/Time   PHART 7.404 04/25/2020 0627   HCO3 22.8 04/25/2020 0627   TCO2 24 04/25/2020 0627   ACIDBASEDEF 2.0 04/25/2020 0627   O2SAT 68.7 04/29/2020 0334   CBG (last 3)  Recent Labs    04/28/20 1111  04/28/20 1528 04/28/20 2122  GLUCAP 88 139* 116*    Assessment/Plan: S/P Procedure(s) (LRB): CORONARY ARTERY BYPASS GRAFTING (CABG), ON PUMP, TIMES THREE, USING LEFT INTERNAL MAMMARY ARTERY AND ENDOSCOPICALLY HARVESTED RIGHT GREATER SAPHENOUS VEIN (N/A) INDOCYANINE GREEN FLUORESCENCE IMAGING (ICG) (N/A) TRANSESOPHAGEAL ECHOCARDIOGRAM (TEE) (N/A) Mobilize Diuresis Plan for transfer to step-down: see transfer orders   LOS: 7 days    [name] 04/29/2020

## 2020-04-29 NOTE — Progress Notes (Signed)
Physical Therapy Treatment Patient Details Name: Sabrina Swanson MRN: [id number] DOB: Mar 01, 1960 Today's Date: 04/29/2020    History of Present Illness 60 yo admitted 4/19 with CAD s/p CABGx 3 on 4/20. PMhx: HTN, COPD, DM, HLD    PT Comments    Pt with significant improvement now on 3L [place] with sats 100% at rest and 91% with gait. Pt able to walk long hall distance but not yet ready to attempt without RW. Educated pt for all precautions. Pt states family has ordered a power recliner for her and she plans to sleep in it. Performed bed mobility and educated for benefit of being able to use core to perform transfers and not rely on recliner. Pt will need to perform stairs and hopefully gait without AD next session.   HR 85-90 Pre gait 139/94 (109) Post gait 156/93 (112)    Follow Up Recommendations  Supervision - Intermittent     Equipment Recommendations  Rolling walker with 5" wheels    Recommendations for Other Services       Precautions / Restrictions Precautions Precautions: Sternal Restrictions Weight Bearing Restrictions: Yes (sternal precautions)    Mobility  Bed Mobility Overal bed mobility: Needs Assistance Bed Mobility: Sit to Sidelying         Sit to sidelying: Min assist General bed mobility comments: min assist to fully clear legs onto surface, cues for sequence    Transfers Overall transfer level: Needs assistance   Transfers: Sit to/from Stand Sit to Stand: Supervision         General transfer comment: cues for hand placement  Ambulation/Gait Ambulation/Gait assistance: Supervision Gait Distance (Feet): 800 Feet Assistive device: Rolling walker (2 wheeled) Gait Pattern/deviations: Step-through pattern;Decreased stride length   Gait velocity interpretation: 1.31 - 2.62 ft/sec, indicative of limited community ambulator General Gait Details: pt with good posture and gait speed, cues for direction, pt not yet willing to attempt without  RW   Stairs             Wheelchair Mobility    Modified Rankin (Stroke Patients Only)       Balance Overall balance assessment: Mild deficits observed, not formally tested                                          Cognition Arousal/Alertness: Awake/alert Behavior During Therapy: WFL for tasks assessed/performed Overall Cognitive Status: Impaired/Different from baseline                       Memory: Decreased recall of precautions                Exercises General Exercises - Lower Extremity Heel Slides: AROM;Both;Supine;15 reps Hip ABduction/ADduction: AROM;Both;Supine;15 reps Straight Leg Raises: AROM;Both;Supine;15 reps    General Comments        Pertinent Vitals/Pain Pain Assessment: No/denies pain    Home Living                      Prior Function            PT Goals (current goals can now be found in the care plan section) Progress towards PT goals: Progressing toward goals    Frequency    Min 3X/week      PT Plan Discharge plan needs to be updated    Co-evaluation  AM-PAC PT "6 Clicks" Mobility   Outcome Measure  Help needed turning from your back to your side while in a flat bed without using bedrails?: A Little Help needed moving from lying on your back to sitting on the side of a flat bed without using bedrails?: A Little Help needed moving to and from a bed to a chair (including a wheelchair)?: A Little Help needed standing up from a chair using your arms (e.g., wheelchair or bedside chair)?: A Little Help needed to walk in hospital room?: A Little Help needed climbing 3-5 steps with a railing? : A Little 6 Click Score: 18    End of Session   Activity Tolerance: Patient tolerated treatment well Patient left: in bed;with call bell/phone within reach Nurse Communication: Mobility status;Precautions PT Visit Diagnosis: Other abnormalities of gait and mobility  (R26.89);Difficulty in walking, not elsewhere classified (R26.2)     Time: 2637-8588 PT Time Calculation (min) (ACUTE ONLY): 27 min  Charges:  $Gait Training: 8-22 mins $Therapeutic Exercise: 8-22 mins                     Hadlyn Amero P, PT Acute Rehabilitation Services Pager: [phone] Office: [place] 04/29/2020, 11:48 AM

## 2020-04-29 NOTE — Progress Notes (Addendum)
Pharmacy Antibiotic Note  Sabrina Swanson is a 60 y.o. 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 Pharmacy has been consulted for Vancomycin and Cefepime dosing.  WBC now wnl at 8.7. Afebrile. Renal function stable Scr 0.7, CrCl 74 ml/min. Day 4 of ABX today. Continue for 7 days total per CCM.  Plan: Vancomycin 1500 mg Q24 hrs (est AUC 495) Cefepime 2 gm IV q8 hrs Monitor renal function, clinical progression Continue ABX for 7 days total   Height: 5\' 4"  (162.6 cm) Weight: 74.7 kg (164 lb 10.9 oz) IBW/kg (Calculated) : 54.7  Temp (24hrs), Avg:98.3 F (36.8 C), Min:97.6 F (36.4 C), Max:99.3 F (37.4 C)  Recent Labs  Lab 04/24/20 1601 04/25/20 0214 04/26/20 0441 04/27/20 0339 04/28/20 0524 04/29/20 0334  WBC 12.8* 14.4* 14.9*  --  9.9 8.7  CREATININE 0.88 0.83 0.84 0.64 0.74 0.70    Estimated Creatinine Clearance: 74 mL/min (by C-G formula based on SCr of 0.7 mg/dL).    Allergies  Allergen Reactions  . Acetaminophen Itching and Nausea Only    Antimicrobials this admission: Vancomycin 4/20 x2; 4/23 >>  Cefepime 4/23 >>  Cefuroxime 4/20 >>4/22  Dose adjustments this admission: N/A  Microbiology results: 4/19 MRSA PCR: neg  Richardine Service, PharmD, BCPS PGY2 Cardiology Pharmacy Resident Phone: [phone] 04/29/2020  1:31 PM  Please check [email or web address] for unit-specific pharmacy phone numbers.

## 2020-04-30 ENCOUNTER — Other Ambulatory Visit (HOSPITAL_COMMUNITY): Payer: Self-pay

## 2020-04-30 LAB — BASIC METABOLIC PANEL
Anion gap: 8 (ref 5–15)
BUN: 7 mg/dL (ref 6–20)
CO2: 25 mmol/L (ref 22–32)
Calcium: 7.9 mg/dL — ABNORMAL LOW (ref 8.9–10.3)
Chloride: 106 mmol/L (ref 98–111)
Creatinine, Ser: 0.61 mg/dL (ref 0.44–1.00)
GFR, Estimated: >60 mL/min (ref >60)
Glucose, Bld: 99 mg/dL (ref 70–99)
Potassium: 3.6 mmol/L (ref 3.5–5.1)
Sodium: 139 mmol/L (ref 135–145)

## 2020-04-30 LAB — GLUCOSE, CAPILLARY
Glucose-Capillary: 116 mg/dL — ABNORMAL HIGH (ref 70–99)
Glucose-Capillary: 126 mg/dL — ABNORMAL HIGH (ref 70–99)
Glucose-Capillary: 124 mg/dL — ABNORMAL HIGH (ref 70–99)
Glucose-Capillary: 147 mg/dL — ABNORMAL HIGH (ref 70–99)

## 2020-04-30 MED ORDER — FUROSEMIDE 10 MG/ML IJ SOLN
40.0000 mg | Freq: Two times a day (BID) | INTRAMUSCULAR | Status: DC
  Administered 2020-04-30 (×2): 40 mg via INTRAVENOUS
  Filled 2020-04-30 (×2): qty 4

## 2020-04-30 MED ORDER — CARVEDILOL 25 MG PO TABS
25.0000 mg | ORAL_TABLET | Freq: Two times a day (BID) | ORAL | Status: DC
  Administered 2020-04-30 – 2020-05-01 (×2): 25 mg via ORAL
  Filled 2020-04-30 (×2): qty 1

## 2020-04-30 NOTE — Progress Notes (Addendum)
Progress Note  Patient Name: Sabrina Swanson Date of Encounter: 04/30/2020  Primary Cardiologist: Dr. Geraldo Pitter, MD   Subjective   Feels well today with no chest pain or SOB despite low O2 saturations in the upper 80's. Placed on 3L [place] with improvement   Inpatient Medications    Scheduled Meds: . acetaminophen  500 mg Oral Q6H  . aspirin EC  81 mg Oral Daily  . atorvastatin  80 mg Oral Daily  . bisacodyl  10 mg Oral Daily   Or  . bisacodyl  10 mg Rectal Daily  . carvedilol  12.5 mg Oral BID WC  . Chlorhexidine Gluconate Cloth  6 each Topical Daily  . clopidogrel  75 mg Oral Daily  . docusate sodium  200 mg Oral Daily  . furosemide  40 mg Intravenous BID  . gabapentin  300 mg Oral BID  . insulin aspart  0-24 Units Subcutaneous TID WC & HS  . lidocaine  1 patch Transdermal Q24H  . losartan  50 mg Oral Daily  . mouth rinse  15 mL Mouth Rinse BID  . pantoprazole  40 mg Oral Daily  . sodium chloride flush  3 mL Intravenous Q12H   Continuous Infusions: . ceFEPime (MAXIPIME) IV 2 g (04/30/20 0524)  . vancomycin Stopped (04/29/20 1242)   PRN Meds: dextrose, levalbuterol, metoprolol tartrate, ondansetron (ZOFRAN) IV, oxyCODONE, sodium chloride flush, traMADol   Vital Signs    Vitals:   04/30/20 0300 04/30/20 0436 04/30/20 0437 04/30/20 0729  BP:  (!) 144/96  (!) 143/86  Pulse: 83 83  91  Resp: 15 17  19   Temp:  98.6 F (37 C)  98.3 F (36.8 C)  TempSrc:  Oral  Oral  SpO2: 98% 100%  98%  Weight:   74.1 kg   Height:        Intake/Output Summary (Last 24 hours) at 04/30/2020 0928 Last data filed at 04/30/2020 0748 Gross per 24 hour  Intake 1612.98 ml  Output --  Net 1612.98 ml   Filed Weights   04/28/20 0500 04/29/20 0700 04/30/20 0437  Weight: 74 kg 74.7 kg 74.1 kg    Physical Exam   General: Well developed, well nourished, NAD Neck: Negative for carotid bruits. No JVD Lungs: Diminished in bilateral lobes. Breathing is unlabored. Cardiovascular: RRR  with S1 S2. No murmurs Abdomen: Soft, non-tender, non-distended. No obvious abdominal masses. Extremities: No edema. Radial pulses 2+ bilaterally Neuro: Alert and oriented. No focal deficits. No facial asymmetry. MAE spontaneously. Psych: Responds to questions appropriately with normal affect.    Labs    Chemistry Recent Labs  Lab 04/23/20 2200 04/24/20 0454 04/28/20 0524 04/29/20 0334 04/30/20 0443  NA 141   < > 140 139 139  K 4.6   < > 3.3* 3.7 3.6  CL 110   < > 104 104 106  CO2 24   < > 28 27 25   GLUCOSE 168*   < > 145* 98 99  BUN 10   < > 14 12 7   CREATININE 1.03*   < > 0.74 0.70 0.61  CALCIUM 7.6*   < > 8.7* 8.5* 7.9*  PROT 5.0*  --   --   --   --   ALBUMIN 3.3*  --   --   --   --   AST 127*  --   --   --   --   ALT 63*  --   --   --   --  ALKPHOS 43  --   --   --   --   BILITOT 1.1  --   --   --   --   GFRNONAA >60   < > >60 >60 >60  ANIONGAP 7   < > 8 8 8    < > = values in this interval not displayed.     Hematology Recent Labs  Lab 04/26/20 0441 04/28/20 0524 04/29/20 0334  WBC 14.9* 9.9 8.7  RBC 3.33* 3.68* 3.55*  HGB 10.0* 11.2* 10.6*  HCT 30.8* 33.5* 32.4*  MCV 92.5 91.0 91.3  MCH 30.0 30.4 29.9  MCHC 32.5 33.4 32.7  RDW 15.6* 14.5 14.4  PLT 116* 214 217    Cardiac EnzymesNo results for input(s): TROPONINI in the last 168 hours. No results for input(s): TROPIPOC in the last 168 hours.   BNPNo results for input(s): BNP, PROBNP in the last 168 hours.   DDimer No results for input(s): DDIMER in the last 168 hours.   Radiology    DG Chest 2 View  Result Date: 04/29/2020 CLINICAL DATA:  Respiratory distress EXAM: CHEST - 2 VIEW COMPARISON:  04/28/2020, CT 04/28/2020 FINDINGS: The lungs are well expanded and pulmonary insufflation is stable when compared to prior examination. Right internal jugular Cordis introducer is unchanged. Retrocardiac opacification related to left basilar consolidation is unchanged. Small left pleural effusion is likely  present. Right basilar infiltrate is stable. Coronary artery bypass grafting has been performed. Cardiac size is mildly enlarged. Pulmonary vascularity is stable. No acute bone abnormality. IMPRESSION: Stable retrocardiac consolidation and right basilar focal pulmonary infiltrate. Small left pleural effusions suspected. Stable cardiomegaly. Electronically Signed   By: Helyn NumbersAshesh  Parikh MD   On: 04/29/2020 06:30   CT ANGIO CHEST PE W OR WO CONTRAST  Result Date: 04/28/2020 CLINICAL DATA:  60 year old female with concern for pulmonary embolism. EXAM: CT ANGIOGRAPHY CHEST WITH CONTRAST TECHNIQUE: Multidetector CT imaging of the chest was performed using the standard protocol during bolus administration of intravenous contrast. Multiplanar CT image reconstructions and MIPs were obtained to evaluate the vascular anatomy. CONTRAST:  80mL OMNIPAQUE IOHEXOL 300 MG/ML  SOLN COMPARISON:  Chest radiograph dated 04/28/2020. FINDINGS: Cardiovascular: Mild cardiomegaly. Pericardial effusion measuring approximately up to 7 mm in thickness. There is retrograde flow of contrast from the right atrium into the IVC suggestive of a degree of right heart dysfunction. Correlation with clinical exam and echocardiogram recommended. There is coronary vascular calcification and postsurgical changes of CABG. Mild atherosclerotic calcification of the thoracic aorta. Evaluation of the pulmonary arteries is limited due to respiratory motion artifact. No large or central pulmonary artery embolus identified. Mediastinum/Nodes: No definite hilar adenopathy. Evaluation is however limited due to consolidative changes of the adjacent lungs. Subcarinal lymph node measures 12 mm in short axis. The esophagus is grossly unremarkable. No mediastinal fluid collection. Lungs/Pleura: Small left pleural effusion. There is consolidative changes of the majority of the left lower lobe. Bilateral patchy ground-glass and streaky densities concerning for pneumonia.  Clinical correlation and follow-up recommended. There is no pneumothorax. The central airways are patent. Upper Abdomen: No acute abnormality. Musculoskeletal: Mild degenerative changes of the spine. Median sternotomy wires. No acute osseous pathology. Review of the MIP images confirms the above findings. IMPRESSION: 1. No CT evidence of central pulmonary artery embolus. 2. Small left pleural effusion with consolidative changes of the majority of the left lower lobe. Bilateral patchy ground-glass and streaky densities concerning for multilobar pneumonia. Clinical correlation and follow-up recommended. 3. Mild cardiomegaly with  small pericardial effusion. Correlation with clinical exam and echocardiogram recommended. 4. Aortic Atherosclerosis (ICD10-I70.0). Electronically Signed   By: Anner Crete M.D.   On: 04/28/2020 22:41   Telemetry    04/30/20 NSR with rates in the 70-90's - Personally Reviewed  ECG    No new tracing as of 04/30/20- Personally Reviewed  Cardiac Studies   Echo 04/22/20: IMPRESSIONS   1. Left ventricular ejection fraction, by estimation, is 55 to 60%. The  left ventricle has normal function. The left ventricle has regional wall  motion abnormalities. Mild hypokinesis of the left ventricular, basal  inferior wall and inferolateral wall.  Left ventricular diastolic parameters are consistent with Grade II  diastolic dysfunction (pseudonormalization). The average left ventricular  global longitudinal strain is -16.4 %. The global longitudinal strain is  mildly abnormal.  2. Right ventricular systolic function is normal. The right ventricular  size is normal. Tricuspid regurgitation signal is inadequate for assessing  PA pressure.  3. The mitral valve is normal in structure. No evidence of mitral valve  regurgitation. No evidence of mitral stenosis.  4. The aortic valve is normal in structure. Aortic valve regurgitation is  not visualized. No aortic stenosis is  present.  5. The inferior vena cava is normal in size with greater than 50%  respiratory variability, suggesting right atrial pressure of 3 mmHg.   Comparison(s): No significant change from prior study. Prior images  reviewed side by side.   Cardiac Cath: Conclusion    RPDA lesion is 95% stenosed.  Prox RCA-2 lesion is 95% stenosed.  Mid RCA lesion is 90% stenosed.  Dist RCA lesion is 80% stenosed.  Prox RCA-1 lesion is 80% stenosed.  Prox RCA-3 lesion is 95% stenosed.  Ramus-1 lesion is 50% stenosed.  Ramus-2 lesion is 70% stenosed.  Prox Cx to Mid Cx lesion is 95% stenosed.  Mid Cx to Dist Cx lesion is 50% stenosed.  Dist Cx lesion is 80% stenosed with 80% stenosed side branch in 3rd Mrg.  Ost LAD to Prox LAD lesion is 60% stenosed.  Mid LAD lesion is 50% stenosed.  The left ventricular systolic function is normal.  LV end diastolic pressure is normal.  Severe multivessel CAD with focal 60% ostial LAD stenosis followed by 50% proximal to mid stenosis; 50 and 70% stenoses in the ramus intermediate vessel; tortuous left circumflex coronary artery with 95% proximal stenosis followed by 50, 95 and 80% stenoses in a diffusely diseased segment; and severe diffuse disease in the RCA with 80% proximal followed by diffuse 95% stenoses with bridging collaterals from the conus branch to the mid RCA and additional 90% and 80% stenoses with subtotal occlusion of the PDA. There is retrograde filling of the PDA via the left coronary injection.  Normal LV function with EF estimated 60 to 65% without focal segmental wall motion abnormalities. LVEDP ranging from 20 to 23 mm Hg.  RECOMMENDATION: Surgical consultation for CABG revascularization surgery. I have recommended the patient stay in the hospital following her catheterization and be initiated on anti-ischemic medication with surgical consultation today and hopeful surgery this week. If patient cannot have surgery the  next several days she may be able to be be discharged until elective surgery can be scheduled.  Patient Profile     60 y.o. female with multivessel CAD admitted 4/19 after cardiac catheterization and underwent CABG 4/20.   Assessment & Plan    1. Multivessel CAD s/p CABG: -POD #7>>management per TCTS -No chest pain -Having difficulty with  hypoxia>>ambulation trial requested for today>>may require supplemental home O2 -Continue ASA, Plavix, statin, beta blocker  2. Post operative hypoxia: -Improved over the last several days>>not felt to be due to fluid volume overload with clear CXR and no overload on exam  -Plan per TCTS to diurese for one additional day  -Placed on supplemental O2 with improvement   3. HTN: -Elevated 143/86>>144/96>>150/81 -Will up-titrate carvedilol to 25mg  BID -Continue losartan 50mg  QD   4. HLD: -Continue high intensity statin -Check Lipid panel today  -Plan for repeat lipid panel and LFTs in 6-8 weeks  5. DM2: -HbA1c, 6.9 on 04/23/20 -SSI while inpatient -Will add SGLT2>>cost checked by pharmD>>$0  Signed, Kathyrn Drown NP-C [facility] Pager: [phone] 04/30/2020, 9:28 AM     For questions or updates, please contact   Please consult [email or web address] for contact info under Cardiology/STEMI.  Patient seen, examined. Available data reviewed. Agree with findings, assessment, and plan as outlined by Kathyrn Drown, NP-C.  The patient is independently interviewed and examined.  JVP is normal, lungs are diminished at the bases, heart is regular rate and rhythm with no murmur gallop, extremities have no lower extremity edema.  The patient appears to be progressing well.  She still has some oxygen requirement but this is improved.  I agree with the medication changes outlined above.  We will arrange outpatient cardiology follow-up for ongoing cardiac care post-hospital.  Sherren Mocha, M.D. 04/30/2020 2:58 PM

## 2020-04-30 NOTE — Progress Notes (Signed)
CARDIAC REHAB PHASE I   PRE:  Rate/Rhythm: 96 SR  BP:  Sitting: 151/92      SaO2: 92 RA  MODE:  Ambulation: 800 ft   POST:  Rate/Rhythm: 98 SR  BP:  Sitting: 115/84    SaO2: 87 RA --> 90 RA   Pt ambulated 837ft in hallway standby assist with steady gait. Pt denies pain, dizziness, or SOB throughout walk. Sats maintained on RA during ambulation. Demonstrating ~625 on IS. Encouraged continued ambulation and IS use. Will continue to follow.  [id number] Rufina Falco, RN BSN 04/30/2020 9:48 AM

## 2020-04-30 NOTE — TOC Benefit Eligibility Note (Signed)
Patient Teacher, English as a foreign language completed.    The patient is currently admitted and upon discharge could be taking Jardiance 10 mg.  The current 30 day co-pay is, $0.00.   The patient is currently admitted and upon discharge could be taking Farxiga 10 mg.  The current 30 day co-pay is, $0.00.   The patient is insured through [employer] and Medicare Part D     Lyndel Safe, [place] Patient Advocate Specialist [place] Team Direct Number: [phone]  Fax: [phone]

## 2020-04-30 NOTE — Progress Notes (Addendum)
      [address]       Sabrina Swanson,[place] [address]             [phone]      7 Days Post-Op Procedure(s) (LRB): CORONARY ARTERY BYPASS GRAFTING (CABG), ON PUMP, TIMES THREE, USING LEFT INTERNAL MAMMARY ARTERY AND ENDOSCOPICALLY HARVESTED RIGHT GREATER SAPHENOUS VEIN (N/A) INDOCYANINE GREEN FLUORESCENCE IMAGING (ICG) (N/A) TRANSESOPHAGEAL ECHOCARDIOGRAM (TEE) (N/A) Subjective: Transferred from ICU to [place] yesterday.  Awake and alert, eating breakfast. Walked in the hall yesterday. Having bowel movements.  O2 at 3L/[place], sat 98%.  Objective: Vital signs in last 24 hours: Temp:  [97.6 F (36.4 C)-99.3 F (37.4 C)] 98.3 F (36.8 C) (04/27 0729) Pulse Rate:  [82-92] 91 (04/27 0729) Cardiac Rhythm: Normal sinus rhythm (04/27 0738) Resp:  [11-29] 19 (04/27 0729) BP: (122-150)/(81-96) 143/86 (04/27 0729) SpO2:  [94 %-100 %] 98 % (04/27 0729) Weight:  [74.1 kg] 74.1 kg (04/27 0437)     Intake/Output from previous day: 04/26 0701 - 04/27 0700 In: 9563 [P.O.:1220; IV Piggyback:393] Out: -  Intake/Output this shift: Total I/O In: 240 [P.O.:240] Out: -   General appearance: alert, cooperative and no distress Neurologic: intact Heart: NSR Lungs: breath sounds are clear. CXR from yesterday reviewed, Volume loss left base, no significant effusions.  Abdomen: soft and non-tender Extremities: well perfused, no peripheral edema Wound: the sternotomy incision and RLE incision are both intact and dry.   Lab Results: Recent Labs    04/28/20 0524 04/29/20 0334  WBC 9.9 8.7  HGB 11.2* 10.6*  HCT 33.5* 32.4*  PLT 214 217   BMET:  Recent Labs    04/29/20 0334 04/30/20 0443  NA 139 139  K 3.7 3.6  CL 104 106  CO2 27 25  GLUCOSE 98 99  BUN 12 7  CREATININE 0.70 0.61  CALCIUM 8.5* 7.9*    PT/INR: No results for input(s): LABPROT, INR in the last 72 hours. ABG    Component Value Date/Time   PHART 7.404 04/25/2020 0627   HCO3 22.8 04/25/2020 0627   TCO2 24  04/25/2020 0627   ACIDBASEDEF 2.0 04/25/2020 0627   O2SAT 68.7 04/29/2020 0334   CBG (last 3)  Recent Labs    04/29/20 1637 04/29/20 2110 04/30/20 0601  GLUCAP 163* 117* 116*    Assessment/Plan: S/P Procedure(s) (LRB): CORONARY ARTERY BYPASS GRAFTING (CABG), ON PUMP, TIMES THREE, USING LEFT INTERNAL MAMMARY ARTERY AND ENDOSCOPICALLY HARVESTED RIGHT GREATER SAPHENOUS VEIN (N/A) INDOCYANINE GREEN FLUORESCENCE IMAGING (ICG) (N/A) TRANSESOPHAGEAL ECHOCARDIOGRAM (TEE) (N/A)  -POD-7 CABG x 3- Stable VS and cardiac rhythm stable. Continue current Rx.   -Hypoxia- O2 has been weaned down significantly over past 3 days. Does non appear volume overloaded on exam and the CXR does not show obvious vascular congestion. Will continue working on pulmonary hygiene and ambulation. Diurese one more day. May need home O2. Will request walk test today to document needs.   -Disposition- she plans to return home and says her son and sister will be available to assist her.     LOS: 8 days    Sabrina Swanson, [place] [phone] 04/30/2020 Pt seen and examined; agree with PA documentation. Plan a little more diuresis then hopefully discharge later this week. Sabrina Swanson Sabrina Swanson Seen, [place]

## 2020-04-30 NOTE — Care Management Important Message (Signed)
Important Message  Patient Details  Name: Sabrina Swanson MRN: [id number] Date of Birth: Aug 24, 1960   Medicare Important Message Given:  Yes     [name] [address] 04/30/2020, 3:45 PM

## 2020-05-01 ENCOUNTER — Encounter: Payer: Self-pay | Admitting: Cardiothoracic Surgery

## 2020-05-01 ENCOUNTER — Inpatient Hospital Stay (HOSPITAL_COMMUNITY): Payer: Medicare Other

## 2020-05-01 LAB — COMPREHENSIVE METABOLIC PANEL
ALT: 77 U/L — ABNORMAL HIGH (ref 0–44)
AST: 38 U/L (ref 15–41)
Albumin: 2.8 g/dL — ABNORMAL LOW (ref 3.5–5.0)
Alkaline Phosphatase: 74 U/L (ref 38–126)
Anion gap: 9 (ref 5–15)
BUN: 11 mg/dL (ref 6–20)
CO2: 28 mmol/L (ref 22–32)
Calcium: 8.5 mg/dL — ABNORMAL LOW (ref 8.9–10.3)
Chloride: 101 mmol/L (ref 98–111)
Creatinine, Ser: 0.72 mg/dL (ref 0.44–1.00)
GFR, Estimated: >60 mL/min (ref >60)
Glucose, Bld: 129 mg/dL — ABNORMAL HIGH (ref 70–99)
Potassium: 3.2 mmol/L — ABNORMAL LOW (ref 3.5–5.1)
Sodium: 138 mmol/L (ref 135–145)
Total Bilirubin: 0.7 mg/dL (ref 0.3–1.2)
Total Protein: 5.8 g/dL — ABNORMAL LOW (ref 6.5–8.1)

## 2020-05-01 LAB — LIPID PANEL
Cholesterol: 77 mg/dL (ref 0–200)
HDL: 14 mg/dL — ABNORMAL LOW (ref >40)
LDL Cholesterol: 41 mg/dL (ref 0–99)
Total CHOL/HDL Ratio: 5.5 RATIO
Triglycerides: 111 mg/dL (ref <150)
VLDL: 22 mg/dL (ref 0–40)

## 2020-05-01 LAB — GLUCOSE, CAPILLARY
Glucose-Capillary: 135 mg/dL — ABNORMAL HIGH (ref 70–99)
Glucose-Capillary: 108 mg/dL — ABNORMAL HIGH (ref 70–99)

## 2020-05-01 MED ORDER — DAPAGLIFLOZIN PROPANEDIOL 10 MG PO TABS: 10.0000 mg | ORAL_TABLET | Freq: Every day | ORAL | 2 refills | Status: AC

## 2020-05-01 MED ORDER — POTASSIUM CHLORIDE CRYS ER 20 MEQ PO TBCR
40.0000 meq | EXTENDED_RELEASE_TABLET | Freq: Two times a day (BID) | ORAL | Status: DC
  Administered 2020-05-01: 40 meq via ORAL
  Filled 2020-05-01: qty 2

## 2020-05-01 MED ORDER — OXYCODONE HCL 5 MG PO TABS: 5.0000 mg | ORAL_TABLET | Freq: Four times a day (QID) | ORAL | 0 refills | Status: AC | PRN

## 2020-05-01 MED ORDER — CARVEDILOL 25 MG PO TABS: 25.0000 mg | ORAL_TABLET | Freq: Two times a day (BID) | ORAL | 2 refills | Status: AC

## 2020-05-01 MED ORDER — ATORVASTATIN CALCIUM 40 MG PO TABS: 40.0000 mg | ORAL_TABLET | Freq: Every day | ORAL | 11 refills | Status: DC

## 2020-05-01 MED ORDER — LOSARTAN POTASSIUM 50 MG PO TABS: 50.0000 mg | ORAL_TABLET | Freq: Every day | ORAL | 5 refills | Status: DC

## 2020-05-01 MED ORDER — LEVOFLOXACIN 500 MG PO TABS: 500.0000 mg | ORAL_TABLET | Freq: Every day | ORAL | 0 refills | Status: DC

## 2020-05-01 MED ORDER — LEVOFLOXACIN 500 MG PO TABS: 500.0000 mg | ORAL_TABLET | Freq: Every day | ORAL | 0 refills | Status: AC

## 2020-05-01 MED ORDER — CLOPIDOGREL BISULFATE 75 MG PO TABS: 75.0000 mg | ORAL_TABLET | Freq: Every day | ORAL | 5 refills | Status: DC

## 2020-05-01 NOTE — Progress Notes (Signed)
Physical Therapy Treatment/ Discharge Patient Details Name: Sabrina Swanson MRN: [id number] DOB: 1960-12-16 Today's Date: 05/01/2020    History of Present Illness 60 yo admitted 4/19 with CAD s/p CABGx 3 on 4/20. PMhx: HTN, COPD, DM, HLD    PT Comments    Pt very pleasant and eager to D/C today. Pt able to perform all transfers and gait without physical assist or AD. Pt completed stairs, gait, HEP and educated for sternal precautions, walking program and heart healthy diet. pt encouraged to have long handled sponge and pulse oximeter at D/C. Pt has met all acute therapy goals with pt agreeable to D/C from our service. Will sign off.   HR 92-99 126/89 (100) pre gait 134/79 (94) post gait 94% on 3L throughout   Follow Up Recommendations  No PT follow up     Equipment Recommendations  3in1 (PT)    Recommendations for Other Services       Precautions / Restrictions Precautions Precautions: Sternal    Mobility  Bed Mobility Overal bed mobility: Modified Independent Bed Mobility: Rolling;Sidelying to Sit Rolling: Modified independent (Device/Increase time) Sidelying to sit: Modified independent (Device/Increase time)       General bed mobility comments: cues for sequence and precautions    Transfers Overall transfer level: Modified independent   Transfers: Sit to/from [facility] Sit to Stand: Modified independent (Device/Increase time)            Ambulation/Gait Ambulation/Gait assistance: Independent Gait Distance (Feet): 800 Feet Assistive device: None Gait Pattern/deviations: Step-through pattern;Decreased stride length   Gait velocity interpretation: >2.62 ft/sec, indicative of community ambulatory General Gait Details: pt with good posture, speed and stability with SpO2 94% on 3L with gait   Stairs Stairs: Yes Stairs assistance: Modified independent (Device/Increase time) Stair Management: Alternating pattern;Forwards;One rail  Left Number of Stairs: 4     Wheelchair Mobility    Modified Rankin (Stroke Patients Only)       Balance Overall balance assessment: No apparent balance deficits (not formally assessed)                                          Cognition Arousal/Alertness: Awake/alert Behavior During Therapy: WFL for tasks assessed/performed Overall Cognitive Status: Within Functional Limits for tasks assessed                                        Exercises General Exercises - Lower Extremity Long Arc Quad: AROM;Both;Seated;20 reps Hip Flexion/Marching: AROM;Both;Seated;20 reps    General Comments        Pertinent Vitals/Pain Pain Assessment: No/denies pain    Home Living                      Prior Function            PT Goals (current goals can now be found in the care plan section) Progress towards PT goals: Goals met/education completed, patient discharged from PT    Frequency    Min 3X/week      PT Plan Current plan remains appropriate;Equipment recommendations need to be updated    Co-evaluation              AM-PAC PT "6 Clicks" Mobility   Outcome Measure  Help needed turning from your back to  your side while in a flat bed without using bedrails?: None Help needed moving from lying on your back to sitting on the side of a flat bed without using bedrails?: None Help needed moving to and from a bed to a chair (including a wheelchair)?: None Help needed standing up from a chair using your arms (e.g., wheelchair or bedside chair)?: None Help needed to walk in hospital room?: None Help needed climbing 3-5 steps with a railing? : None 6 Click Score: 24    End of Session   Activity Tolerance: Patient tolerated treatment well Patient left: in chair;with call bell/phone within reach Nurse Communication: Mobility status;Precautions PT Visit Diagnosis: Other abnormalities of gait and mobility (R26.89);Difficulty in  walking, not elsewhere classified (R26.2)     Time: 0923-3007 PT Time Calculation (min) (ACUTE ONLY): 33 min  Charges:  $Gait Training: 8-22 mins $Therapeutic Exercise: 8-22 mins                     Doris Gruhn P, PT Acute Rehabilitation Services Pager: [phone] Office: [place] 05/01/2020, 9:53 AM

## 2020-05-01 NOTE — Progress Notes (Signed)
RN went over discharge paperwork with pt. NT took PIV out. Pt belongings with pt and pt's son. NTs transporting pt to private vehicle. Pt to be transported home by her son.

## 2020-05-01 NOTE — Progress Notes (Signed)
  This encounter was created in error - please disregard. Patient in hospital.  

## 2020-05-01 NOTE — Progress Notes (Signed)
CARDIAC REHAB PHASE I   D/c education completed with pt. Pt educated on importance of site care and monitoring incisions daily. Encouraged continued IS use, walks, and sternal precautions. Pt given in-the-tube sheet along with heart healthy and diabetic diets. Reviewed restrictions and exercise guidelines. Will refer to CRP II [place].  [id number] Rufina Falco, RN BSN 05/01/2020 10:35 AM

## 2020-05-01 NOTE — TOC Transition Note (Addendum)
Transition of Care [facility]) - CM/SW Discharge Note   Patient Details  Name: Sabrina Swanson MRN: [id number] Date of Birth: [birth year]  [facility] CM/SW Contact:  Zenon Mayo, RN Phone Number: 05/01/2020, 10:23 AM   Clinical Narrative:    Patient for dc today, she will need home oxygen, NCM offered choice,  she is ok with [employer] supplying the oxygen for her.  Oxygen will be delivered to her room prior to dc.   Final next level of care: Home/Self Care Barriers to Discharge: No Barriers Identified   Patient Goals and CMS Choice Patient states their goals for this hospitalization and ongoing recovery are:: return home      Discharge Placement                       Discharge Plan and Services                DME Arranged: Oxygen Sabrina Swanson)   Date DME Agency Contacted: 05/01/20 Time DME Agency Contacted: 85 Representative spoke with at DME Agency: [facility] Interventions     Readmission Risk Interventions No flowsheet data found.

## 2020-05-01 NOTE — Progress Notes (Signed)
SATURATION QUALIFICATIONS: (This note is used to comply with regulatory documentation for home oxygen)  Patient Saturations on Room Air at Rest = 93%  Patient Saturations on Room Air while Ambulating = 70%  Patient Saturations on 3 Liters of oxygen while Ambulating = 95%  Please briefly explain why patient needs home oxygen: Pt de-sats into 80s on rm air with activity

## 2020-05-01 NOTE — Progress Notes (Addendum)
[address]       [place] [address]             [phone]      8 Days Post-Op Procedure(s) (LRB): CORONARY ARTERY BYPASS GRAFTING (CABG), ON PUMP, TIMES THREE, USING LEFT INTERNAL MAMMARY ARTERY AND ENDOSCOPICALLY HARVESTED RIGHT GREATER SAPHENOUS VEIN (N/A) INDOCYANINE GREEN FLUORESCENCE IMAGING (ICG) (N/A) TRANSESOPHAGEAL ECHOCARDIOGRAM (TEE) (N/A) Subjective: Feels ok, minimal sputum, denies SOB  Objective: Vital signs in last 24 hours: Temp:  [98.1 F (36.7 C)-98.9 F (37.2 C)] 98.6 F (37 C) (04/28 0551) Pulse Rate:  [83-96] 86 (04/28 0551) Cardiac Rhythm: Normal sinus rhythm (04/27 1900) Resp:  [17-27] 23 (04/28 0551) BP: (116-143)/(69-87) 122/82 (04/28 0551) SpO2:  [91 %-98 %] 96 % (04/28 0551) Weight:  [72.4 kg] 72.4 kg (04/28 0551)  Hemodynamic parameters for last 24 hours:    Intake/Output from previous day: 04/27 0701 - 04/28 0700 In: 1342 [P.O.:1060; I.V.:3; IV Piggyback:279] Out: 2956 [Urine:1650] Intake/Output this shift: No intake/output data recorded.  General appearance: alert, cooperative and no distress Heart: regular rate and rhythm Lungs: dim left base Abdomen: benign Extremities: no edema Wound: incis healing well  Lab Results: Recent Labs    04/29/20 0334  WBC 8.7  HGB 10.6*  HCT 32.4*  PLT 217   BMET:  Recent Labs    04/30/20 0443 05/01/20 0025  NA 139 138  K 3.6 3.2*  CL 106 101  CO2 25 28  GLUCOSE 99 129*  BUN 7 11  CREATININE 0.61 0.72  CALCIUM 7.9* 8.5*    PT/INR: No results for input(s): LABPROT, INR in the last 72 hours. ABG    Component Value Date/Time   PHART 7.404 04/25/2020 0627   HCO3 22.8 04/25/2020 0627   TCO2 24 04/25/2020 0627   ACIDBASEDEF 2.0 04/25/2020 0627   O2SAT 68.7 04/29/2020 0334   CBG (last 3)  Recent Labs    04/30/20 1635 04/30/20 2032 05/01/20 0605  GLUCAP 124* 147* 135*    Meds Scheduled Meds: . acetaminophen  500 mg Oral Q6H  . aspirin EC  81 mg  Oral Daily  . atorvastatin  80 mg Oral Daily  . bisacodyl  10 mg Oral Daily   Or  . bisacodyl  10 mg Rectal Daily  . carvedilol  25 mg Oral BID WC  . Chlorhexidine Gluconate Cloth  6 each Topical Daily  . clopidogrel  75 mg Oral Daily  . docusate sodium  200 mg Oral Daily  . furosemide  40 mg Intravenous BID  . gabapentin  300 mg Oral BID  . insulin aspart  0-24 Units Subcutaneous TID WC & HS  . lidocaine  1 patch Transdermal Q24H  . losartan  50 mg Oral Daily  . mouth rinse  15 mL Mouth Rinse BID  . pantoprazole  40 mg Oral Daily  . sodium chloride flush  3 mL Intravenous Q12H   Continuous Infusions: . ceFEPime (MAXIPIME) IV 2 g (05/01/20 0530)  . vancomycin 1,500 mg (04/30/20 1044)   PRN Meds:.dextrose, levalbuterol, metoprolol tartrate, ondansetron (ZOFRAN) IV, oxyCODONE, sodium chloride flush, traMADol  Xrays No results found.  Assessment/Plan: S/P Procedure(s) (LRB): CORONARY ARTERY BYPASS GRAFTING (CABG), ON PUMP, TIMES THREE, USING LEFT INTERNAL MAMMARY ARTERY AND ENDOSCOPICALLY HARVESTED RIGHT GREATER SAPHENOUS VEIN (N/A) INDOCYANINE GREEN FLUORESCENCE IMAGING (ICG) (N/A) TRANSESOPHAGEAL ECHOCARDIOGRAM (TEE) (N/A)   1 afeb , VSS 2 sats ok on 3 liters [place] 3 normal renal fxn, k+ 3.2 -  will replace, good uOP, weight conts to decrease- will stop IV BID lasix 4 CXR - left basilar consolidation- remains on Vanco/maxepime- consider transition to po versus stop- started 4/23 5 very low cholesterol levels... Decrease statin dose?, ? accuracy 6 good CBG control 7 cont rehab  LOS: 9 days    Sabrina Giovanni PA-C Pager [phone] 05/01/2020 Pt seen and examined; discussed with PA. Plan discharge today with home O2 and po abx for another 5 days Konstantina Nachreiner Z. Orvan Seen, [place]

## 2020-05-06 ENCOUNTER — Encounter: Payer: Self-pay | Admitting: Cardiothoracic Surgery

## 2020-05-06 ENCOUNTER — Ambulatory Visit (INDEPENDENT_AMBULATORY_CARE_PROVIDER_SITE_OTHER): Payer: Self-pay | Admitting: Cardiothoracic Surgery

## 2020-05-06 ENCOUNTER — Ambulatory Visit
Admission: RE | Admit: 2020-05-06 | Discharge: 2020-05-06 | Disposition: A | Discharge status: Home / Self Care | Payer: Medicare Other | Source: Ambulatory Visit | Attending: Cardiothoracic Surgery | Admitting: Cardiothoracic Surgery

## 2020-05-06 ENCOUNTER — Other Ambulatory Visit: Payer: Self-pay

## 2020-05-06 VITALS — BP 117/82 | HR 71 | Resp 20 | Ht 64.0 in | Wt 156.0 lb

## 2020-05-06 DIAGNOSIS — Z951 Presence of aortocoronary bypass graft: Secondary | ICD-10-CM

## 2020-05-06 NOTE — Progress Notes (Signed)
[address]       ,[place] [address]             [phone]     CARDIOTHORACIC SURGERY OFFICE NOTE  Referring Provider is Troy Sine, MD Primary Cardiologist is None PCP is Wynelle Fanny, DO   HPI:  60 year old lady underwent urgent CABG approximately 2 weeks ago.  She presents for initial outpatient visit.  She had been smoking up to the time of surgery and therefore required aggressive pulmonary toilet, diuresis and was ultimately discharged with home oxygen.  This is being weaned but she still uses it occasionally.  She denies any fevers or chills or difficulty with the wound.  Her appetite remains a little poor.   Current Outpatient Medications  Medication Sig Dispense Refill  . albuterol (VENTOLIN HFA) 108 (90 Base) MCG/ACT inhaler Inhale 2 puffs into the lungs every 4 (four) hours as needed for shortness of breath.    Marland Kitchen aspirin 81 MG EC tablet Take 81 mg by mouth daily.    Marland Kitchen atorvastatin (LIPITOR) 40 MG tablet Take 1 tablet (40 mg total) by mouth daily. 30 tablet 11  . carvedilol (COREG) 25 MG tablet Take 1 tablet (25 mg total) by mouth 2 (two) times daily with a meal. 60 tablet 2  . clopidogrel (PLAVIX) 75 MG tablet Take 1 tablet (75 mg total) by mouth daily. 30 tablet 5  . dapagliflozin propanediol (FARXIGA) 10 MG TABS tablet Take 1 tablet (10 mg total) by mouth daily before breakfast. 30 tablet 2  . ergocalciferol (VITAMIN D2) 1.25 MG (50000 UT) capsule Take 50,000 Units by mouth every 7 (seven) days.    . Ipratropium-Albuterol (COMBIVENT) 20-100 MCG/ACT AERS respimat Inhale 1 puff into the lungs daily in the afternoon.    Marland Kitchen levofloxacin (LEVAQUIN) 500 MG tablet Take 1 tablet (500 mg total) by mouth daily for 5 days. 5 tablet 0  . losartan (COZAAR) 50 MG tablet Take 1 tablet (50 mg total) by mouth daily. 30 tablet 5  . metFORMIN (GLUCOPHAGE-XR) 500 MG 24 hr tablet Take 500 mg by mouth daily.    Marland Kitchen NARCAN 4 MG/0.1ML LIQD nasal spray kit Place 4 mg  into the nose once.    . nitroGLYCERIN (NITROSTAT) 0.4 MG SL tablet Place 0.4 mg under the tongue every 5 (five) minutes as needed for chest pain.    . Omega-3 Fatty Acids (FISH OIL) 1000 MG CAPS Take 1,000 mg by mouth daily.    Marland Kitchen oxyCODONE (OXY IR/ROXICODONE) 5 MG immediate release tablet Take 1 tablet (5 mg total) by mouth every 6 (six) hours as needed for up to 7 days for severe pain. 28 tablet 0  . vitamin B-12 (CYANOCOBALAMIN) 1000 MCG tablet Take 1,000 mcg by mouth daily.     No current facility-administered medications for this visit.      Physical Exam:   BP 117/82 (BP Location: Left Arm, Patient Position: Sitting)   Pulse 71   Resp 20   Ht '5\' 4"'  (1.626 m)   Wt 70.8 kg   SpO2 91% Comment: 3L O2 [place]  BMI 26.78 kg/m   General:  No acute distress  Chest:   Clear to auscultation bilaterally  CV:   Regular rate and rhythm  Incisions:  Healing well  Abdomen:  Mildly distended soft  Extremities:  No edema; right lower extremity vein site without erythema but mild induration  Diagnostic Tests:  Personally reviewed her chest x-ray from today  which demonstrates a mildly elevated left hemidiaphragm and clear lung fields otherwise   Impression:  Doing well after CABG  Plan:  Follow-up in 10 days for a wound check Encourage enteral intake Would not drive until released in 10 days  I spent in excess of 15 minutes during the conduct of this office consultation and >50% of this time involved direct face-to-face encounter with the patient for counseling and/or coordination of their care.  Level 2                 10 minutes Level 3                 15 minutes Level 4                 25 minutes Level 5                 40 minutes  B.  Murvin Natal, MD 05/06/2020 4:07 PM

## 2020-05-09 ENCOUNTER — Telehealth (HOSPITAL_COMMUNITY): Payer: Self-pay

## 2020-05-09 NOTE — Telephone Encounter (Signed)
Cardiac rehab referral for Ph.II faxed to [place] [facility].  

## 2020-05-13 ENCOUNTER — Encounter: Payer: Self-pay | Admitting: Thoracic Surgery (Cardiothoracic Vascular Surgery)

## 2020-05-13 ENCOUNTER — Ambulatory Visit: Payer: Medicare Other | Admitting: Cardiothoracic Surgery

## 2020-05-13 ENCOUNTER — Other Ambulatory Visit: Payer: Self-pay

## 2020-05-13 ENCOUNTER — Encounter: Payer: Self-pay | Admitting: Cardiothoracic Surgery

## 2020-05-13 VITALS — BP 131/83 | HR 82 | Temp 97.4°F | Resp 20 | Wt 159.2 lb

## 2020-05-13 DIAGNOSIS — Z951 Presence of aortocoronary bypass graft: Secondary | ICD-10-CM

## 2020-05-13 NOTE — Progress Notes (Signed)
The patient returns for her second postoperative visit status post CABG.  She did well but was discharged on home oxygen.  She has subsequently stopped smoking and is now off oxygen.  She has no complaints  Physical exam:  BP 131/83 (BP Location: Right Arm, Patient Position: Sitting, Cuff Size: Normal)   Pulse 82   Temp (!) 97.4 F (36.3 C) (Skin)   Resp 20   Wt 72.2 kg   SpO2 100% Comment: RA  BMI 27.33 kg/m  Well-appearing no acute distress Clear to auscultation bilaterally Regular rate and rhythm Incisions healing well  Imaging: No new examinations  Impression/plan: Doing well after CABG Continue ambulation and pulmonary toilet Okay to drive Follow-up with [facility] cardiology  Sabrina Swanson Z. Orvan Seen, [place]

## 2020-05-15 ENCOUNTER — Ambulatory Visit: Payer: Medicare Other | Admitting: Cardiothoracic Surgery

## 2020-05-19 ENCOUNTER — Other Ambulatory Visit: Payer: Self-pay

## 2020-05-21 ENCOUNTER — Encounter: Payer: Self-pay | Admitting: Cardiology

## 2020-05-21 ENCOUNTER — Ambulatory Visit (INDEPENDENT_AMBULATORY_CARE_PROVIDER_SITE_OTHER): Payer: Medicare Other | Admitting: Cardiology

## 2020-05-21 ENCOUNTER — Other Ambulatory Visit: Payer: Self-pay

## 2020-05-21 VITALS — BP 134/86 | HR 90 | Ht 64.0 in | Wt 159.0 lb

## 2020-05-21 DIAGNOSIS — I2510 Atherosclerotic heart disease of native coronary artery without angina pectoris: Secondary | ICD-10-CM

## 2020-05-21 DIAGNOSIS — E1140 Type 2 diabetes mellitus with diabetic neuropathy, unspecified: Secondary | ICD-10-CM

## 2020-05-21 DIAGNOSIS — Z87891 Personal history of nicotine dependence: Secondary | ICD-10-CM

## 2020-05-21 DIAGNOSIS — I10 Essential (primary) hypertension: Secondary | ICD-10-CM | POA: Diagnosis not present

## 2020-05-21 DIAGNOSIS — E782 Mixed hyperlipidemia: Secondary | ICD-10-CM

## 2020-05-21 HISTORY — DX: Personal history of nicotine dependence: Z87.891

## 2020-05-21 NOTE — Progress Notes (Signed)
Cardiology Office Note:    Date:  05/21/2020   ID:  Sabrina Swanson, DOB [birth year], MRN [id number]  PCP:  Wynelle Fanny, DO  Cardiologist:  None  Electrophysiologist:  None   Referring MD: Wynelle Fanny, DO     History of Present Illness:    Sabrina Swanson is a 60 y.o. female with a hx of coronary artery disease status post CABG x3 in April 2022, hypertension, hyperlipidemia, smoker and diabetes mellitus.  I did see the patient on April 16, 2020 after she presented due to abnormal stress test advised the patient had a left heart catheterization was highly recommended.  The patient did undergo medical cannulization which showed severe multivessel disease.  It was recommended she remain in the hospital and underwent coronary artery bypass grafting surgery.  She is here today for follow-up visit.  The patient tells me that she is improving without any chest pain.  Slowly getting back to her.  She stopped smoking today of her surgery and has not picked up a cigarette since she tells me.  She also tells me that she has had very poor appetite.  Past Medical History:  Diagnosis Date  . Acquired trigger finger 06/10/2012  . Alopecia 05/16/2013  . Angina pectoris ([place]) 11/13/2019  . Anxiety state 06/10/2012  . Arthritis of right acromioclavicular joint 11/27/2018  . Back pain   . Benign neoplasm of colon 06/10/2012  . Bursitis disorder 05/11/2011  . Cardiac murmur 11/13/2019  . Carpal tunnel syndrome 06/10/2012   Formatting of this note might be different from the original. bilateral  . Chalazion right upper eyelid 06/30/2015  . Continuous dependence on cigarette smoking 11/13/2019  . COPD (chronic obstructive pulmonary disease) ([place]) 05/16/2013  . DDD (degenerative disc disease), lumbosacral 06/10/2012  . Depressive disorder 06/10/2012  . Diabetes mellitus without complication ([place])   . Dyspareunia 03/04/2014  . Dysthymia 06/10/2012  . Encounter for long-term (current) use of other medications 06/10/2012  .  Essential hypertension 08/14/2019  . Generalized anxiety disorder 06/10/2012  . Hammer toe of right foot 10/23/2014   Formatting of this note might be different from the original. Second and third tarsals Formatting of this note might be different from the original. Formatting of this note might be different from the original. Second and third tarsals  . High cholesterol   . Hypercholesterolemia 03/06/2013  . Hypertension   . Insomnia 06/10/2012  . Lumbosacral spondylosis 06/10/2012  . Lump of skin 10/23/2014   Formatting of this note might be different from the original. Right breast-manipulated by patient prior to clinical exam  . Migraine syndrome 06/10/2012  . Numbness of toes 10/23/2014  . Osteoarthrosis, generalized, involving multiple sites 06/10/2012  . Pain in soft tissues of limb 06/10/2012   Formatting of this note might be different from the original. 03/09/11 right arm.  . Pain in toes of both feet 10/23/2014  . Patellar tendinitis 06/10/2012  . Persistent lymphocytosis 05/25/2014  . Plantar fascial fibromatosis 06/10/2012  . Risk for falls 03/05/2015  . Sensorineural hearing loss 06/10/2012  . Shoulder impingement, right 11/27/2018  . Subacute vaginitis 07/18/2015  . Tendinopathy of rotator cuff, right 11/27/2018  . Tinnitus 03/21/2013  . Tobacco use disorder 03/06/2013  . Type 2 diabetes, controlled, with neuropathy ([place]) 10/21/2014  . Unspecified cataract 06/30/2015  . Vertigo 06/14/2013  . Vestibular dizziness 06/10/2012    Past Surgical History:  Procedure Laterality Date  . ABDOMINAL HYSTERECTOMY    . CESAREAN SECTION    . CORONARY  ARTERY BYPASS GRAFT N/A 04/23/2020   Procedure: CORONARY ARTERY BYPASS GRAFTING (CABG), ON PUMP, TIMES THREE, USING LEFT INTERNAL MAMMARY ARTERY AND ENDOSCOPICALLY HARVESTED RIGHT GREATER SAPHENOUS VEIN;  Surgeon: [name], MD;  Location: [facility] [place];  Service: Open Heart Surgery;  Laterality: N/A;  . LEFT HEART CATH AND CORONARY ANGIOGRAPHY N/A 04/22/2020    Procedure: LEFT HEART CATH AND CORONARY ANGIOGRAPHY;  Surgeon: Troy Sine, MD;  Location: [facility];  Service: Cardiovascular;  Laterality: N/A;  . TEE WITHOUT CARDIOVERSION N/A 04/23/2020   Procedure: TRANSESOPHAGEAL ECHOCARDIOGRAM (TEE);  Surgeon: [name], MD;  Location: [facility];  Service: Open Heart Surgery;  Laterality: N/A;    Current Medications: Current Meds  Medication Sig  . albuterol (VENTOLIN HFA) 108 (90 Base) MCG/ACT inhaler Inhale 2 puffs into the lungs every 4 (four) hours as needed for shortness of breath.  Marland Kitchen aspirin 81 MG EC tablet Take 81 mg by mouth daily.  Marland Kitchen atorvastatin (LIPITOR) 40 MG tablet Take 1 tablet (40 mg total) by mouth daily.  . carvedilol (COREG) 25 MG tablet Take 1 tablet (25 mg total) by mouth 2 (two) times daily with a meal.  . clopidogrel (PLAVIX) 75 MG tablet Take 1 tablet (75 mg total) by mouth daily.  . dapagliflozin propanediol (FARXIGA) 10 MG TABS tablet Take 1 tablet (10 mg total) by mouth daily before breakfast.  . ergocalciferol (VITAMIN D2) 1.25 MG (50000 UT) capsule Take 50,000 Units by mouth every 7 (seven) days.  . Ipratropium-Albuterol (COMBIVENT) 20-100 MCG/ACT AERS respimat Inhale 1 puff into the lungs daily in the afternoon.  Marland Kitchen levofloxacin (LEVAQUIN) 500 MG tablet Take 500 mg by mouth daily.  Marland Kitchen losartan (COZAAR) 50 MG tablet Take 1 tablet (50 mg total) by mouth daily.  . metFORMIN (GLUCOPHAGE-XR) 500 MG 24 hr tablet Take 500 mg by mouth daily.  Marland Kitchen NARCAN 4 MG/0.1ML LIQD nasal spray kit Place 4 mg into the nose once.  . nitroGLYCERIN (NITROSTAT) 0.4 MG SL tablet Place 0.4 mg under the tongue every 5 (five) minutes as needed for chest pain.  . Omega-3 Fatty Acids (FISH OIL) 1000 MG CAPS Take 1,000 mg by mouth daily.  . vitamin B-12 (CYANOCOBALAMIN) 1000 MCG tablet Take 1,000 mcg by mouth daily.     Allergies:   Acetaminophen   Social History   Socioeconomic History  . Marital status: Single    Spouse name: Not on  file  . Number of children: Not on file  . Years of education: Not on file  . Highest education level: Not on file  Occupational History  . Not on file  Tobacco Use  . Smoking status: Former Smoker    Packs/day: 1.00    Types: Cigarettes    Quit date: 04/22/2020    Years since quitting: 0.0  . Smokeless tobacco: Never Used  Vaping Use  . Vaping Use: Never used  Substance and Sexual Activity  . Alcohol use: Yes    Comment: occasional  . Drug use: No    Comment: did eat marijuana brownie last week  . Sexual activity: Not on file  Other Topics Concern  . Not on file  Social History Narrative  . Not on file   Social Determinants of Health   Financial Resource Strain: Not on file  Food Insecurity: Not on file  Transportation Needs: Not on file  Physical Activity: Not on file  Stress: Not on file  Social Connections: Not on file  Family History: The patient's family history includes Diabetes in her maternal grandfather; Hypertension in her maternal grandmother; Stomach cancer in her mother.  ROS:   Review of Systems  Constitution: Negative for decreased appetite, fever and weight gain.  HENT: Negative for congestion, ear discharge, hoarse voice and sore throat.   Eyes: Negative for discharge, redness, vision loss in right eye and visual halos.  Cardiovascular: Negative for chest pain, dyspnea on exertion, leg swelling, orthopnea and palpitations.  Respiratory: Negative for cough, hemoptysis, shortness of breath and snoring.   Endocrine: Negative for heat intolerance and polyphagia.  Hematologic/Lymphatic: Negative for bleeding problem. Does not bruise/bleed easily.  Skin: Negative for flushing, nail changes, rash and suspicious lesions.  Musculoskeletal: Negative for arthritis, joint pain, muscle cramps, myalgias, neck pain and stiffness.  Gastrointestinal: Negative for abdominal pain, bowel incontinence, diarrhea and excessive appetite.  Genitourinary: Negative for  decreased libido, genital sores and incomplete emptying.  Neurological: Negative for brief paralysis, focal weakness, headaches and loss of balance.  Psychiatric/Behavioral: Negative for altered mental status, depression and suicidal ideas.  Allergic/Immunologic: Negative for HIV exposure and persistent infections.    EKGs/Labs/Other Studies Reviewed:    The following studies were reviewed today:   EKG: None today  Recent Labs: 04/24/2020: Magnesium 2.4 04/29/2020: Hemoglobin 10.6; Platelets 217 05/01/2020: ALT 77; BUN 11; Creatinine, Ser 0.72; Potassium 3.2; Sodium 138  Recent Lipid Panel    Component Value Date/Time   CHOL 77 05/01/2020 0025   TRIG 111 05/01/2020 0025   HDL 14 (L) 05/01/2020 0025   CHOLHDL 5.5 05/01/2020 0025   VLDL 22 05/01/2020 0025   LDLCALC 41 05/01/2020 0025    Physical Exam:    VS:  BP 134/86   Pulse 90   Ht '5\' 4"'  (1.626 m)   Wt 159 lb (72.1 kg)   SpO2 97%   BMI 27.29 kg/m     Wt Readings from Last 3 Encounters:  05/21/20 159 lb (72.1 kg)  05/13/20 159 lb 3.2 oz (72.2 kg)  05/06/20 156 lb (70.8 kg)     GEN: Well nourished, well developed in no acute distress HEENT: Normal NECK: No JVD; No carotid bruits LYMPHATICS: No lymphadenopathy CARDIAC: S1S2 noted,RRR, no murmurs, rubs, gallops RESPIRATORY:  Clear to auscultation without rales, wheezing or rhonchi  ABDOMEN: Soft, non-tender, non-distended, +bowel sounds, no guarding. EXTREMITIES: No edema, No cyanosis, no clubbing MUSCULOSKELETAL:  No deformity  SKIN: Warm and dry NEUROLOGIC:  Alert and oriented x 3, non-focal PSYCHIATRIC:  Normal affect, good insight  ASSESSMENT:    1. Coronary artery disease involving native coronary artery of native heart, unspecified whether angina present   2. Hypertension, unspecified type   3. Essential hypertension   4. Type 2 diabetes, controlled, with neuropathy ([place])   5. Former smoker   6. Mixed hyperlipidemia    PLAN:     She appears to be  doing well postoperatively.  She has no complaints at this time.  We will continue patient on aspirin 81 mg daily, Plavix 75 mg daily as well as atorvastatin 40 mg daily.  I reviewed her lipid panel which was done with May 01, 2020 showed total cholesterol 77, triglyceride 11 HDL 14, LDL 41.  She needs her LDL target of less than 55.  Unfortunately her HDL is very low.  I encouraged the patient to increase diet that would help increasing her HDL which I was able to share with her different types of foods.  Unfortunately she has not started cardiac  rehab, she reports because of the timing of it.  I encouraged the patient that cardiac rehab will be beneficial for her.  She is going to think about this in the meantime we have sent a referral  Continued smoking cessation encouraged.  The patient is in agreement with the above plan. The patient left the office in stable condition.  The patient will follow up in   Medication Adjustments/Labs and Tests Ordered: Current medicines are reviewed at length with the patient today.  Concerns regarding medicines are outlined above.  Orders Placed This Encounter  Procedures  . AMB referral to cardiac rehabilitation   No orders of the defined types were placed in this encounter.   Patient Instructions  Medication Instructions:  Your physician recommends that you continue on your current medications as directed. Please refer to the Current Medication list given to you today.  *If you need a refill on your cardiac medications before your next appointment, please call your pharmacy*   Lab Work: None If you have labs (blood work) drawn today and your tests are completely normal, you will receive your results only by: Marland Kitchen MyChart Message (if you have MyChart) OR . A paper copy in the mail If you have any lab test that is abnormal or we need to change your treatment, we will call you to review the results.   Testing/Procedures: None   Follow-Up: At  [facility], you and your health needs are our priority.  As part of our continuing mission to provide you with exceptional heart care, we have created designated Provider Care Teams.  These Care Teams include your primary Cardiologist (physician) and Advanced Practice Providers (APPs -  Physician Assistants and Nurse Practitioners) who all work together to provide you with the care you need, when you need it.  We recommend signing up for the patient portal called "MyChart".  Sign up information is provided on this After Visit Summary.  MyChart is used to connect with patients for Virtual Visits (Telemedicine).  Patients are able to view lab/test results, encounter notes, upcoming appointments, etc.  Non-urgent messages can be sent to your provider as well.   To learn more about what you can do with MyChart, go to [email or web address].    Your next appointment:   3 month(s)  The format for your next appointment:   In Person  Provider:   Jyl Heinz, MD   Other Instructions      Adopting a Healthy Lifestyle.  Know what a healthy weight is for you (roughly BMI <25) and aim to maintain this   Aim for 7+ servings of fruits and vegetables daily   65-80+ fluid ounces of water or unsweet tea for healthy kidneys   Limit to max 1 drink of alcohol per day; avoid smoking/tobacco   Limit animal fats in diet for cholesterol and heart health - choose grass fed whenever available   Avoid highly processed foods, and foods high in saturated/trans fats   Aim for low stress - take time to unwind and care for your mental health   Aim for 150 min of moderate intensity exercise weekly for heart health, and weights twice weekly for bone health   Aim for 7-9 hours of sleep daily   When it comes to diets, agreement about the perfect plan isnt easy to find, even among the experts. Experts at the [employer] developed an idea known as the Healthy Eating Plate. Just imagine  a plate divided into logical,  healthy portions.   The emphasis is on diet quality:   Load up on vegetables and fruits - one-half of your plate: Aim for color and variety, and remember that potatoes dont count.   Go for whole grains - one-quarter of your plate: Whole wheat, barley, wheat berries, quinoa, oats, brown rice, and foods made with them. If you want pasta, go with whole wheat pasta.   Protein power - one-quarter of your plate: Fish, chicken, beans, and nuts are all healthy, versatile protein sources. Limit red meat.   The diet, however, does go beyond the plate, offering a few other suggestions.   Use healthy plant oils, such as olive, canola, soy, corn, sunflower and peanut. Check the labels, and avoid partially hydrogenated oil, which have unhealthy trans fats.   If youre thirsty, drink water. Coffee and tea are good in moderation, but skip sugary drinks and limit milk and dairy products to one or two daily servings.   The type of carbohydrate in the diet is more important than the amount. Some sources of carbohydrates, such as vegetables, fruits, whole grains, and beans-are healthier than others.   Finally, stay active  Signed, Berniece Salines, DO  05/21/2020 11:00 AM    [place]

## 2020-05-21 NOTE — Patient Instructions (Addendum)

## 2020-05-23 ENCOUNTER — Telehealth: Payer: Self-pay

## 2020-05-23 NOTE — Telephone Encounter (Signed)
Spoke with patient in regards to Cardiac Rehab. The patient states she was just getting home after surgery when she was contacted about the Cardiac Rehab and she states she does want to participate in the Cardiac Rehab. "I was just getting home, I had that oxygen tank and I had to have my son drive me everywhere until they cleared me. I was not about to do all that." When asked did she still want to go to Cardiac Rehab she states "yes, I just wanted them to contact me later, not right when I was getting home, that's all" I will reach out to the Cardiac Rehab in [place] to clarify the matter. Patient verbalizes understanding. No further questions or concerns at this time.

## 2020-05-23 NOTE — Telephone Encounter (Signed)
Spoke with a staff member at Cardiac Rehab. I relayed the information about patient wanting to do Cardiac Rehab but not at the time she was contacted. Staff member states they will reach out Monday.

## 2020-06-03 ENCOUNTER — Ambulatory Visit: Payer: Medicare Other | Admitting: Cardiology

## 2020-06-12 ENCOUNTER — Telehealth: Payer: Self-pay

## 2020-06-12 NOTE — Telephone Encounter (Signed)
Please contact requesting office to verify details surrounding procedure.  We do not provide blanket coverage.  Thank you,  Jossie Ng. Triana Coover NP-C    06/12/2020, 11:41 AM [place] [address] Office [phone] Fax [phone]

## 2020-06-12 NOTE — Telephone Encounter (Signed)
   [facility] Pre-operative Risk Assessment    Patient Name: Sabrina Swanson  DOB: 1960-11-20  MRN: [id number]   HEARTCARE STAFF: - Please ensure there is not already an duplicate clearance open for this procedure. - Under Visit Info/Reason for Call, type in Other and utilize the format Clearance MM/DD/YY or Clearance TBD. Do not use dashes or single digits. - If request is for dental extraction, please clarify the # of teeth to be extracted. - If the patient is currently at the dentist's office, call Pre-Op APP to address. If the patient is not currently in the dentist office, please route to the Pre-Op pool  Request for surgical clearance:  What type of surgery is being performed? Oral Surgery. Patient is unsure what type of surgery and clearance form does not state.   When is this surgery scheduled? TBD   What type of clearance is required (medical clearance vs. Pharmacy clearance to hold med vs. Both)? Both  Are there any medications that need to be held prior to surgery and how long? None noted on form   Practice name and name of physician performing surgery? [facility]/    What is the office phone number? [phone]   7.   What is the office fax number? [phone]  8.   Anesthesia type (None, local, MAC, general) ? Unspecified.    Gita Kudo 06/12/2020, 11:22 AM  _________________________________________________________________   (provider comments below)

## 2020-06-12 NOTE — Telephone Encounter (Signed)
   Primary Cardiologist: Berniece Salines, DO  Chart reviewed as part of pre-operative protocol coverage. Given past medical history and time since last visit, based on ACC/AHA guidelines, Sabrina Swanson would be at acceptable risk for the planned procedure without further cardiovascular testing.   Her RCRI is a class III risk, 6.6% of major cardiac event.  I will route this recommendation to the requesting party via Epic fax function and remove from pre-op pool.  Please call with questions.  Jossie Ng. Dallon Dacosta NP-C    06/12/2020, 12:49 PM [place] [address] Office [phone] Fax [phone]

## 2020-06-12 NOTE — Telephone Encounter (Signed)
S/w [place] with [facility] to clarify procedure to be done. Per [place], pt will be having 2 teeth to be extracted, bone and tori removal and partial plate to be done. Anesthesia will be general.

## 2020-08-11 ENCOUNTER — Other Ambulatory Visit: Payer: Self-pay | Admitting: Physician Assistant

## 2020-08-26 ENCOUNTER — Other Ambulatory Visit: Payer: Self-pay

## 2020-08-26 ENCOUNTER — Ambulatory Visit (HOSPITAL_BASED_OUTPATIENT_CLINIC_OR_DEPARTMENT_OTHER)
Admission: RE | Admit: 2020-08-26 | Discharge: 2020-08-26 | Disposition: A | Discharge status: Home / Self Care | Payer: Medicare Other | Source: Ambulatory Visit | Attending: Cardiology | Admitting: Cardiology

## 2020-08-26 ENCOUNTER — Ambulatory Visit (INDEPENDENT_AMBULATORY_CARE_PROVIDER_SITE_OTHER): Payer: Medicare Other | Admitting: Cardiology

## 2020-08-26 ENCOUNTER — Encounter: Payer: Self-pay | Admitting: Cardiology

## 2020-08-26 VITALS — BP 144/98 | HR 96 | Ht 64.0 in | Wt 170.0 lb

## 2020-08-26 DIAGNOSIS — R0602 Shortness of breath: Secondary | ICD-10-CM | POA: Diagnosis present

## 2020-08-26 DIAGNOSIS — Z951 Presence of aortocoronary bypass graft: Secondary | ICD-10-CM

## 2020-08-26 DIAGNOSIS — E782 Mixed hyperlipidemia: Secondary | ICD-10-CM | POA: Diagnosis not present

## 2020-08-26 DIAGNOSIS — E1140 Type 2 diabetes mellitus with diabetic neuropathy, unspecified: Secondary | ICD-10-CM

## 2020-08-26 DIAGNOSIS — I2510 Atherosclerotic heart disease of native coronary artery without angina pectoris: Secondary | ICD-10-CM

## 2020-08-26 DIAGNOSIS — I10 Essential (primary) hypertension: Secondary | ICD-10-CM | POA: Diagnosis not present

## 2020-08-26 DIAGNOSIS — J449 Chronic obstructive pulmonary disease, unspecified: Secondary | ICD-10-CM | POA: Diagnosis present

## 2020-08-26 NOTE — Patient Instructions (Addendum)
Medication Instructions:  Your physician recommends that you continue on your current medications as directed. Please refer to the Current Medication list given to you today.  *If you need a refill on your cardiac medications before your next appointment, please call your pharmacy*   Lab Work: Your physician recommends that you have labs done in the office today. Your test included  basic metabolic panel, complete blood count, TSH, liver function and lipids.   If you have labs (blood work) drawn today and your tests are completely normal, you will receive your results only by: [place] (if you have MyChart) OR A paper copy in the mail If you have any lab test that is abnormal or we need to change your treatment, we will call you to review the results.   Testing/Procedures: A chest x-ray takes a picture of the organs and structures inside the chest, including the heart, lungs, and blood vessels. This test can show several things, including, whether the heart is enlarges; whether fluid is building up in the lungs; and whether pacemaker / defibrillator leads are still in place.    Follow-Up: At [facility], you and your health needs are our priority.  As part of our continuing mission to provide you with exceptional heart care, we have created designated Provider Care Teams.  These Care Teams include your primary Cardiologist (physician) and Advanced Practice Providers (APPs -  Physician Assistants and Nurse Practitioners) who all work together to provide you with the care you need, when you need it.  We recommend signing up for the patient portal called "MyChart".  Sign up information is provided on this After Visit Summary.  MyChart is used to connect with patients for Virtual Visits (Telemedicine).  Patients are able to view lab/test results, encounter notes, upcoming appointments, etc.  Non-urgent messages can be sent to your provider as well.   To learn more about what you can do  with MyChart, go to [email or web address].    Your next appointment:   6 month(s)  The format for your next appointment:   In Person  Provider:   Jyl Heinz, MD   Other Instructions NA

## 2020-08-26 NOTE — Progress Notes (Signed)
Cardiology Office Note:    Date:  08/26/2020   ID:  Sabrina Swanson, DOB [birth year], MRN [id number]  PCP:  Wynelle Fanny, DO  Cardiologist:  Jenean Lindau, MD   Referring MD: Wynelle Fanny, DO    ASSESSMENT:    No diagnosis found. PLAN:    In order of problems listed above:  Coronary artery disease post CABG surgery: Secondary prevention stressed with the patient.  Importance of compliance with diet medication stressed and she vocalized understanding.  I told her to walk on a regular basis.  Bilaterally air entry at the bases is diminished so I will get a chest x-ray to assess this specially since she is postop. Essential hypertension: Blood pressure stable and diet was emphasized.  Lifestyle modification urged.  She tells me that her blood pressures are better at home. Mixed dyslipidemia: She will have all blood work today and she is fasting including lipids.  She is on lipid-lowering therapy. Ex-smoker: She has not smoked after surgery and I congratulated her about this. Dyspnea on exertion: Issues such as COPD, possible oxygen therapy in a supplemental fashion was discussed she is agreeable to see a pulmonologist for that evaluation.  We will send a referral. Patient will be seen in follow-up appointment in 6 months or earlier if the patient has any concerns    Medication Adjustments/Labs and Tests Ordered: Current medicines are reviewed at length with the patient today.  Concerns regarding medicines are outlined above.  No orders of the defined types were placed in this encounter.  No orders of the defined types were placed in this encounter.    No chief complaint on file.    History of Present Illness:    Sabrina Swanson is a 60 y.o. female.  Patient has past medical history of coronary artery disease post CABG surgery, essential hypertension dyslipidemia and she is an ex-smoker.  She complains of shortness of breath on exertion.  She was recommended oxygen and exercise  which she has not done much since cardiac surgery.  No chest pain orthopnea or PND.  At the time of my evaluation, the patient is alert awake oriented and in no distress.  Past Medical History:  Diagnosis Date   Abnormal stress test 04/16/2020   Acquired trigger finger 06/10/2012   Alopecia 05/16/2013   Angina pectoris ([place]) 11/13/2019   Anxiety state 06/10/2012   Arthritis of right acromioclavicular joint 11/27/2018   Back pain    Benign neoplasm of colon 06/10/2012   Bursitis disorder 05/11/2011   CAD (coronary artery disease), native coronary artery 04/22/2020   Cardiac murmur 11/13/2019   Carpal tunnel syndrome 06/10/2012   Formatting of this note might be different from the original. bilateral   Chalazion right upper eyelid 06/30/2015   Continuous dependence on cigarette smoking 11/13/2019   COPD (chronic obstructive pulmonary disease) ([place]) 05/16/2013   DDD (degenerative disc disease), lumbosacral 06/10/2012   Depressive disorder 06/10/2012   Diabetes mellitus due to underlying condition with hyperosmolarity without coma, without long-term current use of insulin ([place]) 04/16/2020   Diabetes mellitus without complication ([place])    Dyspareunia 03/04/2014   Dysthymia 06/10/2012   Encounter for long-term (current) use of other medications 06/10/2012   Essential hypertension 08/14/2019   Former smoker 05/21/2020   Generalized anxiety disorder 06/10/2012   Hammer toe of right foot 10/23/2014   Formatting of this note might be different from the original. Second and third tarsals Formatting of this note might be different from the original. [employer]  of this note might be different from the original. Second and third tarsals   High cholesterol    Hypercholesterolemia 03/06/2013   Hypertension    Insomnia 06/10/2012   Lumbosacral spondylosis 06/10/2012   Lump of skin 10/23/2014   Formatting of this note might be different from the original. Right breast-manipulated by patient prior to clinical exam   Migraine syndrome  06/10/2012   Mixed hyperlipidemia 04/16/2020   Numbness of toes 10/23/2014   Osteoarthrosis, generalized, involving multiple sites 06/10/2012   Pain in soft tissues of limb 06/10/2012   Formatting of this note might be different from the original. 03/09/11 right arm.   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   Sensorineural hearing loss 06/10/2012   Shoulder impingement, right 11/27/2018   Subacute vaginitis 07/18/2015   Tendinopathy of rotator cuff, right 11/27/2018   Tinnitus 03/21/2013   Tobacco abuse 04/16/2020   Tobacco use disorder 03/06/2013   Type 2 diabetes, controlled, with neuropathy ([place]) 10/21/2014   Unspecified cataract 06/30/2015   Vertigo 06/14/2013   Vestibular dizziness 06/10/2012    Past Surgical History:  Procedure Laterality Date   ABDOMINAL HYSTERECTOMY     CESAREAN SECTION     CORONARY ARTERY BYPASS GRAFT N/A 04/23/2020   Procedure: CORONARY ARTERY BYPASS GRAFTING (CABG), ON PUMP, TIMES THREE, USING LEFT INTERNAL MAMMARY ARTERY AND ENDOSCOPICALLY HARVESTED RIGHT GREATER SAPHENOUS VEIN;  Surgeon: [name], MD;  Location: [facility];  Service: Open Heart Surgery;  Laterality: N/A;   LEFT HEART CATH AND CORONARY ANGIOGRAPHY N/A 04/22/2020   Procedure: LEFT HEART CATH AND CORONARY ANGIOGRAPHY;  Surgeon: Troy Sine, MD;  Location: [facility];  Service: Cardiovascular;  Laterality: N/A;   TEE WITHOUT CARDIOVERSION N/A 04/23/2020   Procedure: TRANSESOPHAGEAL ECHOCARDIOGRAM (TEE);  Surgeon: [name], MD;  Location: [facility];  Service: Open Heart Surgery;  Laterality: N/A;    Current Medications: Current Meds  Medication Sig   albuterol (VENTOLIN HFA) 108 (90 Base) MCG/ACT inhaler Inhale 2 puffs into the lungs every 4 (four) hours as needed for shortness of breath.   aspirin 81 MG EC tablet Take 81 mg by  mouth daily.   atorvastatin (LIPITOR) 40 MG tablet Take 1 tablet (40 mg total) by mouth daily.   carvedilol (COREG) 25 MG tablet Take 1 tablet (25 mg total) by mouth 2 (two) times daily with a meal.   clopidogrel (PLAVIX) 75 MG tablet Take 1 tablet (75 mg total) by mouth daily.   dapagliflozin propanediol (FARXIGA) 10 MG TABS tablet Take 1 tablet (10 mg total) by mouth daily before breakfast.   ergocalciferol (VITAMIN D2) 1.25 MG (50000 UT) capsule Take 50,000 Units by mouth every 7 (seven) days.   Ipratropium-Albuterol (COMBIVENT) 20-100 MCG/ACT AERS respimat Inhale 1 puff into the lungs daily in the afternoon.   levofloxacin (LEVAQUIN) 500 MG tablet Take 500 mg by mouth daily.   losartan (COZAAR) 50 MG tablet Take 1 tablet (50 mg total) by mouth daily.   metFORMIN (GLUCOPHAGE-XR) 500 MG 24 hr tablet Take 500 mg by mouth daily.   NARCAN 4 MG/0.1ML LIQD nasal spray kit Place 4 mg into the nose once.   nitroGLYCERIN (NITROSTAT) 0.4 MG SL tablet Place 0.4 mg under the tongue every 5 (five) minutes as needed for chest pain.   Omega-3 Fatty  Acids (FISH OIL) 1000 MG CAPS Take 1,000 mg by mouth daily.   oxyCODONE-acetaminophen (PERCOCET) 10-325 MG tablet Take 1 tablet by mouth every 8 (eight) hours.   vitamin B-12 (CYANOCOBALAMIN) 1000 MCG tablet Take 1,000 mcg by mouth daily.     Allergies:   Acetaminophen   Social History   Socioeconomic History   Marital status: Single    Spouse name: Not on file   Number of children: Not on file   Years of education: Not on file   Highest education level: Not on file  Occupational History   Not on file  Tobacco Use   Smoking status: Former    Packs/day: 1.00    Types: Cigarettes    Quit date: 04/22/2020    Years since quitting: 0.3   Smokeless tobacco: Never  Vaping Use   Vaping Use: Never used  Substance and Sexual Activity   Alcohol use: Yes    Comment: occasional   Drug use: No    Comment: did eat marijuana brownie last week   Sexual  activity: Not on file  Other Topics Concern   Not on file  Social History Narrative   Not on file   Social Determinants of Health   Financial Resource Strain: Not on file  Food Insecurity: Not on file  Transportation Needs: Not on file  Physical Activity: Not on file  Stress: Not on file  Social Connections: Not on file     Family History: The patient's family history includes Diabetes in her maternal grandfather; Hypertension in her maternal grandmother; Stomach cancer in her mother.  ROS:   Please see the history of present illness.    All other systems reviewed and are negative.  EKGs/Labs/Other Studies Reviewed:    The following studies were reviewed today: LEFT HEART CATH AND CORONARY ANGIOGRAPHY   Conclusion    RPDA lesion is 95% stenosed. Prox RCA-2 lesion is 95% stenosed. Mid RCA lesion is 90% stenosed. Dist RCA lesion is 80% stenosed. Prox RCA-1 lesion is 80% stenosed. Prox RCA-3 lesion is 95% stenosed. Ramus-1 lesion is 50% stenosed. Ramus-2 lesion is 70% stenosed. Prox Cx to Mid Cx lesion is 95% stenosed. Mid Cx to Dist Cx lesion is 50% stenosed. Dist Cx lesion is 80% stenosed with 80% stenosed side branch in 3rd Mrg. Ost LAD to Prox LAD lesion is 60% stenosed. Mid LAD lesion is 50% stenosed. The left ventricular systolic function is normal. LV end diastolic pressure is normal.   Severe multivessel CAD with focal 60% ostial LAD stenosis followed by 50% proximal to mid stenosis; 50 and 70% stenoses in the ramus intermediate vessel; tortuous left circumflex coronary artery with 95% proximal stenosis followed by 50, 95 and 80% stenoses in a diffusely diseased segment; and severe diffuse disease in the RCA with 80% proximal followed by diffuse 95% stenoses with bridging collaterals from the conus branch to the mid RCA and additional 90% and 80% stenoses with subtotal occlusion of the PDA.  There is retrograde filling of the PDA via the left coronary injection.    Normal LV function with EF estimated 60 to 65% without focal segmental wall motion abnormalities.  LVEDP ranging from 20 to 23 mm Hg.   RECOMMENDATION: Surgical consultation for CABG revascularization surgery.  I have recommended the patient stay in the hospital following her catheterization and be initiated on anti-ischemic medication with surgical consultation today and hopeful surgery this week.  If patient cannot have surgery the next several days she may be  able to be be discharged until elective surgery can be scheduled.   Recent Labs: 04/24/2020: Magnesium 2.4 04/29/2020: Hemoglobin 10.6; Platelets 217 05/01/2020: ALT 77; BUN 11; Creatinine, Ser 0.72; Potassium 3.2; Sodium 138  Recent Lipid Panel    Component Value Date/Time   CHOL 77 05/01/2020 0025   TRIG 111 05/01/2020 0025   HDL 14 (L) 05/01/2020 0025   CHOLHDL 5.5 05/01/2020 0025   VLDL 22 05/01/2020 0025   LDLCALC 41 05/01/2020 0025    Physical Exam:    VS:  BP (!) 144/98   Pulse 96   Ht _0  (1.626 m)   Wt 170 lb (77.1 kg)   BMI 29.18 kg/m     Wt Readings from Last 3 Encounters:  08/26/20 170 lb (77.1 kg)  05/21/20 159 lb (72.1 kg)  05/13/20 159 lb 3.2 oz (72.2 kg)     GEN: Patient is in no acute distress HEENT: Normal NECK: No JVD; No carotid bruits LYMPHATICS: No lymphadenopathy CARDIAC: Hear sounds regular, 2/6 systolic murmur at the apex. RESPIRATORY:  Clear to auscultation without rales, wheezing or rhonchi  ABDOMEN: Soft, non-tender, non-distended MUSCULOSKELETAL:  No edema; No deformity  SKIN: Warm and dry NEUROLOGIC:  Alert and oriented x 3 PSYCHIATRIC:  Normal affect   Signed, Jenean Lindau, MD  08/26/2020 8:38 AM    [facility]

## 2020-08-27 LAB — CBC WITH DIFFERENTIAL/PLATELET
Basophils Absolute: 0 10*3/uL (ref 0.0–0.2)
Basos: 1 %
EOS (ABSOLUTE): 0.1 10*3/uL (ref 0.0–0.4)
Eos: 2 %
Hematocrit: 44.2 % (ref 34.0–46.6)
Hemoglobin: 14.9 g/dL (ref 11.1–15.9)
Immature Grans (Abs): 0 10*3/uL (ref 0.0–0.1)
Immature Granulocytes: 0 %
Lymphocytes Absolute: 2.3 10*3/uL (ref 0.7–3.1)
Lymphs: 48 %
MCH: 28.8 pg (ref 26.6–33.0)
MCHC: 33.7 g/dL (ref 31.5–35.7)
MCV: 85 fL (ref 79–97)
Monocytes Absolute: 0.3 10*3/uL (ref 0.1–0.9)
Monocytes: 7 %
Neutrophils Absolute: 2 10*3/uL (ref 1.4–7.0)
Neutrophils: 42 %
Platelets: 256 10*3/uL (ref 150–450)
RBC: 5.18 x10E6/uL (ref 3.77–5.28)
RDW: 14.1 % (ref 11.7–15.4)
WBC: 4.8 10*3/uL (ref 3.4–10.8)

## 2020-08-27 LAB — HEPATIC FUNCTION PANEL
ALT: 21 IU/L (ref 0–32)
AST: 15 IU/L (ref 0–40)
Albumin: 4.8 g/dL (ref 3.8–4.9)
Alkaline Phosphatase: 118 IU/L (ref 44–121)
Bilirubin Total: 0.3 mg/dL (ref 0.0–1.2)
Bilirubin, Direct: <0.1 mg/dL (ref 0.00–0.40)
Total Protein: 7.3 g/dL (ref 6.0–8.5)

## 2020-08-27 LAB — LIPID PANEL
Chol/HDL Ratio: 4.7 ratio — ABNORMAL HIGH (ref 0.0–4.4)
Cholesterol, Total: 191 mg/dL (ref 100–199)
HDL: 41 mg/dL (ref >39)
LDL Chol Calc (NIH): 132 mg/dL — ABNORMAL HIGH (ref 0–99)
Triglycerides: 101 mg/dL (ref 0–149)
VLDL Cholesterol Cal: 18 mg/dL (ref 5–40)

## 2020-08-27 LAB — BASIC METABOLIC PANEL
BUN/Creatinine Ratio: 11 — ABNORMAL LOW (ref 12–28)
BUN: 9 mg/dL (ref 8–27)
CO2: 20 mmol/L (ref 20–29)
Calcium: 9.6 mg/dL (ref 8.7–10.3)
Chloride: 106 mmol/L (ref 96–106)
Creatinine, Ser: 0.82 mg/dL (ref 0.57–1.00)
Glucose: 116 mg/dL — ABNORMAL HIGH (ref 65–99)
Potassium: 4.8 mmol/L (ref 3.5–5.2)
Sodium: 140 mmol/L (ref 134–144)
eGFR: 82 mL/min/{1.73_m2} (ref >59)

## 2020-08-27 LAB — TSH: TSH: 1.48 u[IU]/mL (ref 0.450–4.500)

## 2020-08-27 MED ORDER — ATORVASTATIN CALCIUM 80 MG PO TABS: 40.0000 mg | ORAL_TABLET | Freq: Every day | ORAL | 11 refills | Status: DC

## 2020-08-27 NOTE — Addendum Note (Signed)
Addended by: Truddie Hidden on: 08/27/2020 12:56 PM   Modules accepted: Orders

## 2020-09-26 ENCOUNTER — Institutional Professional Consult (permissible substitution): Payer: Medicare Other | Admitting: Pulmonary Disease

## 2020-09-30 ENCOUNTER — Other Ambulatory Visit: Payer: Self-pay | Admitting: Physician Assistant

## 2020-10-07 ENCOUNTER — Encounter: Payer: Self-pay | Admitting: Pulmonary Disease

## 2020-10-07 ENCOUNTER — Other Ambulatory Visit: Payer: Self-pay

## 2020-10-07 ENCOUNTER — Ambulatory Visit (INDEPENDENT_AMBULATORY_CARE_PROVIDER_SITE_OTHER): Payer: Medicare Other | Admitting: Pulmonary Disease

## 2020-10-07 VITALS — BP 130/88 | HR 83 | Temp 97.8°F | Ht 64.0 in | Wt 174.6 lb

## 2020-10-07 DIAGNOSIS — R0602 Shortness of breath: Secondary | ICD-10-CM

## 2020-10-07 NOTE — Patient Instructions (Signed)
Breathing study at next visit  Graded exercises as tolerated  Follow-up with cardiology  Inhaler use as we reviewed today  You may procure a spacer -will help with inhaler technique  Call with significant concerns  I will see you in about 6 weeks

## 2020-10-07 NOTE — Progress Notes (Signed)
Sabrina Swanson    [id number]    Jun 29, 1960  Primary Care Physician:Lim, Barbera Setters, DO  Referring Physician: Jenean Lindau, [address] [place] ,  [place] [address]  Chief complaint:   Shortness of breath post cardiac surgery  HPI:  With shortness of breath post cardiac surgery Was found to have coronary artery disease for which she had CABG May have had a pneumonia post surgery She is still recovering her  She still does have chest discomfort, chest pressure  Shortness of breath with exertion She is not as winded with regular activity, if she takes a time  Denies any chronic cough  Has been using albuterol, Combivent Inhaler technique was suboptimal-she was taught how to use the inhalers better  Of significant his chest x-ray findings showing hypoventilation, multifocal infiltrate, elevated left hemidiaphragm which has progressively improved  Reformed smoker No pertinent occupational history  Mention of chronic obstructive pulmonary disease She was not having significant shortness of breath prior to diagnosis of coronary artery disease   Outpatient Encounter Medications as of 10/07/2020  Medication Sig   albuterol (VENTOLIN HFA) 108 (90 Base) MCG/ACT inhaler Inhale 2 puffs into the lungs every 4 (four) hours as needed for shortness of breath.   aspirin 81 MG EC tablet Take 81 mg by mouth daily.   atorvastatin (LIPITOR) 80 MG tablet Take 0.5 tablets (40 mg total) by mouth daily.   clopidogrel (PLAVIX) 75 MG tablet Take 1 tablet (75 mg total) by mouth daily.   ergocalciferol (VITAMIN D2) 1.25 MG (50000 UT) capsule Take 50,000 Units by mouth every 7 (seven) days.   Ipratropium-Albuterol (COMBIVENT) 20-100 MCG/ACT AERS respimat Inhale 1 puff into the lungs daily in the afternoon.   losartan (COZAAR) 50 MG tablet Take 1 tablet (50 mg total) by mouth daily.   metFORMIN (GLUCOPHAGE-XR) 500 MG 24 hr tablet Take 500 mg by mouth daily.   NARCAN 4  MG/0.1ML LIQD nasal spray kit Place 4 mg into the nose once.   nitroGLYCERIN (NITROSTAT) 0.4 MG SL tablet Place 0.4 mg under the tongue every 5 (five) minutes as needed for chest pain.   Omega-3 Fatty Acids (FISH OIL) 1000 MG CAPS Take 1,000 mg by mouth daily.   oxyCODONE-acetaminophen (PERCOCET) 10-325 MG tablet Take 1 tablet by mouth every 8 (eight) hours.   vitamin B-12 (CYANOCOBALAMIN) 1000 MCG tablet Take 1,000 mcg by mouth daily.   carvedilol (COREG) 25 MG tablet Take 1 tablet (25 mg total) by mouth 2 (two) times daily with a meal. (Patient not taking: Reported on 10/07/2020)   dapagliflozin propanediol (FARXIGA) 10 MG TABS tablet Take 1 tablet (10 mg total) by mouth daily before breakfast. (Patient not taking: Reported on 10/07/2020)   levofloxacin (LEVAQUIN) 500 MG tablet Take 500 mg by mouth daily. (Patient not taking: Reported on 10/07/2020)   No facility-administered encounter medications on file as of 10/07/2020.    Allergies as of 10/07/2020 - Review Complete 10/07/2020  Allergen Reaction Noted   Acetaminophen Itching and Nausea Only 07/13/2016    Past Medical History:  Diagnosis Date   Abnormal stress test 04/16/2020   Acquired trigger finger 06/10/2012   Alopecia 05/16/2013   Angina pectoris ([place]) 11/13/2019   Anxiety state 06/10/2012   Arthritis of right acromioclavicular joint 11/27/2018   Back pain    Benign neoplasm of colon 06/10/2012   Bursitis disorder 05/11/2011   CAD (coronary artery disease), native coronary artery 04/22/2020  Cardiac murmur 11/13/2019   Carpal tunnel syndrome 06/10/2012   Formatting of this note might be different from the original. bilateral   Chalazion right upper eyelid 06/30/2015   Continuous dependence on cigarette smoking 11/13/2019   COPD (chronic obstructive pulmonary disease) ([place]) 05/16/2013   DDD (degenerative disc disease), lumbosacral 06/10/2012   Depressive disorder 06/10/2012   Diabetes mellitus due to underlying condition with hyperosmolarity  without coma, without long-term current use of insulin ([place]) 04/16/2020   Diabetes mellitus without complication ([place])    Dyspareunia 03/04/2014   Dysthymia 06/10/2012   Encounter for long-term (current) use of other medications 06/10/2012   Essential hypertension 08/14/2019   Former smoker 05/21/2020   Generalized anxiety disorder 06/10/2012   Hammer toe of right foot 10/23/2014   Formatting of this note might be different from the original. Second and third tarsals Formatting of this note might be different from the original. Formatting of this note might be different from the original. Second and third tarsals   High cholesterol    Hypercholesterolemia 03/06/2013   Hypertension    Insomnia 06/10/2012   Lumbosacral spondylosis 06/10/2012   Lump of skin 10/23/2014   Formatting of this note might be different from the original. Right breast-manipulated by patient prior to clinical exam   Migraine syndrome 06/10/2012   Mixed hyperlipidemia 04/16/2020   Numbness of toes 10/23/2014   Osteoarthrosis, generalized, involving multiple sites 06/10/2012   Pain in soft tissues of limb 06/10/2012   Formatting of this note might be different from the original. 03/09/11 right arm.   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   Sensorineural hearing loss 06/10/2012   Shoulder impingement, right 11/27/2018   Subacute vaginitis 07/18/2015   Tendinopathy of rotator cuff, right 11/27/2018   Tinnitus 03/21/2013   Tobacco abuse 04/16/2020   Tobacco use disorder 03/06/2013   Type 2 diabetes, controlled, with neuropathy ([place]) 10/21/2014   Unspecified cataract 06/30/2015   Vertigo 06/14/2013   Vestibular dizziness 06/10/2012    Past Surgical History:  Procedure Laterality Date   ABDOMINAL HYSTERECTOMY     CESAREAN SECTION     CORONARY ARTERY BYPASS GRAFT N/A 04/23/2020    Procedure: CORONARY ARTERY BYPASS GRAFTING (CABG), ON PUMP, TIMES THREE, USING LEFT INTERNAL MAMMARY ARTERY AND ENDOSCOPICALLY HARVESTED RIGHT GREATER SAPHENOUS VEIN;  Surgeon: [name], MD;  Location: [facility];  Service: Open Heart Surgery;  Laterality: N/A;   LEFT HEART CATH AND CORONARY ANGIOGRAPHY N/A 04/22/2020   Procedure: LEFT HEART CATH AND CORONARY ANGIOGRAPHY;  Surgeon: Troy Sine, MD;  Location: [facility];  Service: Cardiovascular;  Laterality: N/A;   TEE WITHOUT CARDIOVERSION N/A 04/23/2020   Procedure: TRANSESOPHAGEAL ECHOCARDIOGRAM (TEE);  Surgeon: [name], MD;  Location: [facility];  Service: Open Heart Surgery;  Laterality: N/A;    Family History  Problem Relation Age of Onset   Stomach cancer Mother    Hypertension Maternal Grandmother    Diabetes Maternal Grandfather     Social History   Socioeconomic History   Marital status: Single    Spouse name: Not on file   Number of children: Not on file   Years of education: Not on file   Highest education level: Not on file  Occupational History   Not on file  Tobacco Use   Smoking status:  Former    Packs/day: 1.00    Types: Cigarettes    Quit date: 04/22/2020    Years since quitting: 0.4   Smokeless tobacco: Never  Vaping Use   Vaping Use: Never used  Substance and Sexual Activity   Alcohol use: Yes    Comment: occasional   Drug use: No    Comment: did eat marijuana brownie last week   Sexual activity: Not on file  Other Topics Concern   Not on file  Social History Narrative   Not on file   Social Determinants of Health   Financial Resource Strain: Not on file  Food Insecurity: Not on file  Transportation Needs: Not on file  Physical Activity: Not on file  Stress: Not on file  Social Connections: Not on file  Intimate Partner Violence: Not on file    Review of Systems  Respiratory:  Positive for chest tightness and shortness of breath.    Vitals:   10/07/20 0917  BP: 130/88   Pulse: 83  Temp: 97.8 F (36.6 C)  SpO2: 95%     Physical Exam Constitutional:      Appearance: Normal appearance.  HENT:     Head: Normocephalic.     Nose: No congestion.     Mouth/Throat:     Mouth: Mucous membranes are moist.  Cardiovascular:     Rate and Rhythm: Normal rate and regular rhythm.     Heart sounds: No murmur heard.   No friction rub.  Pulmonary:     [place]: No respiratory distress.     Breath sounds: No stridor. No wheezing or rhonchi.  Musculoskeletal:     Cervical back: No rigidity or tenderness.  Neurological:     Mental Status: She is alert.  Psychiatric:        Mood and Affect: Mood normal.     Data Reviewed: Chest x-ray is reviewed with the patient  CT scan of the chest reviewed with the patient showing atelectasis, multifocal infiltrates  Assessment:  Shortness of breath on exertion  Possible obstructive lung disease -History of smoking  Coronary artery disease  Diastolic heart failure  Diaphragmatic dysfunction  Plan/Recommendations: Obtain pulmonary function test  Graded exercise as tolerated  Inhaler technique was reviewed -Taught how to use the inhaler properly -Spacer provided  She does have albuterol and Combivent -Has been using albuterol more frequently  Overall, I think she is getting better Shortness of breath is likely multifactorial including significant atelectasis which is improving She still has significant musculoskeletal pains contributing to limitations  Will order pulmonary function test -Useful information will only be obtained if she is not having as much pain and discomfort   Tentative follow-up in about 6 weeks  I spent 45 minutes dedicated to the care of this patient on the date of this encounter to include previsit review of records, face-to-face time with the patient discussing conditions above, post visit ordering of testing, clinical documentation with electronic health record and communicated  necessary findings to members of the patient's care team  Sherrilyn Rist MD [facility] 10/07/2020, 10:05 AM  CC: Revankar, Reita Cliche, MD

## 2020-10-20 ENCOUNTER — Other Ambulatory Visit: Payer: Self-pay | Admitting: Physician Assistant

## 2020-10-21 ENCOUNTER — Other Ambulatory Visit: Payer: Self-pay | Admitting: Physician Assistant

## 2020-10-25 ENCOUNTER — Other Ambulatory Visit: Payer: Self-pay | Admitting: Physician Assistant

## 2020-11-18 ENCOUNTER — Ambulatory Visit (INDEPENDENT_AMBULATORY_CARE_PROVIDER_SITE_OTHER): Payer: Medicare Other | Admitting: Pulmonary Disease

## 2020-11-18 ENCOUNTER — Encounter: Payer: Self-pay | Admitting: Pulmonary Disease

## 2020-11-18 ENCOUNTER — Other Ambulatory Visit: Payer: Self-pay

## 2020-11-18 VITALS — BP 136/80 | HR 72 | Temp 97.6°F | Ht 64.0 in | Wt 176.0 lb

## 2020-11-18 DIAGNOSIS — R0602 Shortness of breath: Secondary | ICD-10-CM | POA: Diagnosis not present

## 2020-11-18 NOTE — Progress Notes (Signed)
Sabrina Swanson    [id number]    [birth year]  Primary Care Physician:Lim, Barbera Setters, DO  Referring Physician: Jenean Lindau, [address] [place] ,  [place] [address]  Chief complaint:   Shortness of breath post cardiac surgery  HPI:  With shortness of breath post cardiac surgery Was found to have coronary artery disease for which she had CABG May have had a pneumonia post surgery  Since the last visit, breathing feels about the same Uses inhaler as needed Not exercising regularly but trying  Still has some chest discomfort with activity  PFT pending No cough No shortness of breath at rest  Has been using albuterol, Combivent Inhaler technique was suboptimal-she was taught how to use the inhalers better  Of significant his chest x-ray findings showing hypoventilation, multifocal infiltrate, elevated left hemidiaphragm which has progressively improved  Reformed smoker No pertinent occupational history  Mention of chronic obstructive pulmonary disease She was not having significant shortness of breath prior to diagnosis of coronary artery disease   Outpatient Encounter Medications as of 10/07/2020  Medication Sig   albuterol (VENTOLIN HFA) 108 (90 Base) MCG/ACT inhaler Inhale 2 puffs into the lungs every 4 (four) hours as needed for shortness of breath.   aspirin 81 MG EC tablet Take 81 mg by mouth daily.   atorvastatin (LIPITOR) 80 MG tablet Take 0.5 tablets (40 mg total) by mouth daily.   clopidogrel (PLAVIX) 75 MG tablet Take 1 tablet (75 mg total) by mouth daily.   ergocalciferol (VITAMIN D2) 1.25 MG (50000 UT) capsule Take 50,000 Units by mouth every 7 (seven) days.   Ipratropium-Albuterol (COMBIVENT) 20-100 MCG/ACT AERS respimat Inhale 1 puff into the lungs daily in the afternoon.   losartan (COZAAR) 50 MG tablet Take 1 tablet (50 mg total) by mouth daily.   metFORMIN (GLUCOPHAGE-XR) 500 MG 24 hr tablet Take 500 mg by mouth daily.    NARCAN 4 MG/0.1ML LIQD nasal spray kit Place 4 mg into the nose once.   nitroGLYCERIN (NITROSTAT) 0.4 MG SL tablet Place 0.4 mg under the tongue every 5 (five) minutes as needed for chest pain.   Omega-3 Fatty Acids (FISH OIL) 1000 MG CAPS Take 1,000 mg by mouth daily.   oxyCODONE-acetaminophen (PERCOCET) 10-325 MG tablet Take 1 tablet by mouth every 8 (eight) hours.   vitamin B-12 (CYANOCOBALAMIN) 1000 MCG tablet Take 1,000 mcg by mouth daily.   carvedilol (COREG) 25 MG tablet Take 1 tablet (25 mg total) by mouth 2 (two) times daily with a meal. (Patient not taking: Reported on 10/07/2020)   dapagliflozin propanediol (FARXIGA) 10 MG TABS tablet Take 1 tablet (10 mg total) by mouth daily before breakfast. (Patient not taking: Reported on 10/07/2020)   levofloxacin (LEVAQUIN) 500 MG tablet Take 500 mg by mouth daily. (Patient not taking: Reported on 10/07/2020)   No facility-administered encounter medications on file as of 10/07/2020.    Allergies as of 10/07/2020 - Review Complete 10/07/2020  Allergen Reaction Noted   Acetaminophen Itching and Nausea Only 07/13/2016    Past Medical History:  Diagnosis Date   Abnormal stress test 04/16/2020   Acquired trigger finger 06/10/2012   Alopecia 05/16/2013   Angina pectoris ([place]) 11/13/2019   Anxiety state 06/10/2012   Arthritis of right acromioclavicular joint 11/27/2018   Back pain    Benign neoplasm of colon 06/10/2012   Bursitis disorder 05/11/2011   CAD (coronary artery disease), native coronary artery 04/22/2020  Cardiac murmur 11/13/2019   Carpal tunnel syndrome 06/10/2012   Formatting of this note might be different from the original. bilateral   Chalazion right upper eyelid 06/30/2015   Continuous dependence on cigarette smoking 11/13/2019   COPD (chronic obstructive pulmonary disease) ([place]) 05/16/2013   DDD (degenerative disc disease), lumbosacral 06/10/2012   Depressive disorder 06/10/2012   Diabetes mellitus due to underlying condition with  hyperosmolarity without coma, without long-term current use of insulin ([place]) 04/16/2020   Diabetes mellitus without complication ([place])    Dyspareunia 03/04/2014   Dysthymia 06/10/2012   Encounter for long-term (current) use of other medications 06/10/2012   Essential hypertension 08/14/2019   Former smoker 05/21/2020   Generalized anxiety disorder 06/10/2012   Hammer toe of right foot 10/23/2014   Formatting of this note might be different from the original. Second and third tarsals Formatting of this note might be different from the original. Formatting of this note might be different from the original. Second and third tarsals   High cholesterol    Hypercholesterolemia 03/06/2013   Hypertension    Insomnia 06/10/2012   Lumbosacral spondylosis 06/10/2012   Lump of skin 10/23/2014   Formatting of this note might be different from the original. Right breast-manipulated by patient prior to clinical exam   Migraine syndrome 06/10/2012   Mixed hyperlipidemia 04/16/2020   Numbness of toes 10/23/2014   Osteoarthrosis, generalized, involving multiple sites 06/10/2012   Pain in soft tissues of limb 06/10/2012   Formatting of this note might be different from the original. 03/09/11 right arm.   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   Sensorineural hearing loss 06/10/2012   Shoulder impingement, right 11/27/2018   Subacute vaginitis 07/18/2015   Tendinopathy of rotator cuff, right 11/27/2018   Tinnitus 03/21/2013   Tobacco abuse 04/16/2020   Tobacco use disorder 03/06/2013   Type 2 diabetes, controlled, with neuropathy ([place]) 10/21/2014   Unspecified cataract 06/30/2015   Vertigo 06/14/2013   Vestibular dizziness 06/10/2012    Past Surgical History:  Procedure Laterality Date   ABDOMINAL HYSTERECTOMY     CESAREAN SECTION     CORONARY ARTERY BYPASS GRAFT N/A  04/23/2020   Procedure: CORONARY ARTERY BYPASS GRAFTING (CABG), ON PUMP, TIMES THREE, USING LEFT INTERNAL MAMMARY ARTERY AND ENDOSCOPICALLY HARVESTED RIGHT GREATER SAPHENOUS VEIN;  Surgeon: [name], MD;  Location: [facility];  Service: Open Heart Surgery;  Laterality: N/A;   LEFT HEART CATH AND CORONARY ANGIOGRAPHY N/A 04/22/2020   Procedure: LEFT HEART CATH AND CORONARY ANGIOGRAPHY;  Surgeon: Troy Sine, MD;  Location: [facility];  Service: Cardiovascular;  Laterality: N/A;   TEE WITHOUT CARDIOVERSION N/A 04/23/2020   Procedure: TRANSESOPHAGEAL ECHOCARDIOGRAM (TEE);  Surgeon: [name], MD;  Location: [facility];  Service: Open Heart Surgery;  Laterality: N/A;    Family History  Problem Relation Age of Onset   Stomach cancer Mother    Hypertension Maternal Grandmother    Diabetes Maternal Grandfather     Social History   Socioeconomic History   Marital status: Single    Spouse name: Not on file   Number of children: Not on file   Years of education: Not on file   Highest education level: Not on file  Occupational History   Not on file  Tobacco Use   Smoking status:  Former    Packs/day: 1.00    Types: Cigarettes    Quit date: 04/22/2020    Years since quitting: 0.4   Smokeless tobacco: Never  Vaping Use   Vaping Use: Never used  Substance and Sexual Activity   Alcohol use: Yes    Comment: occasional   Drug use: No    Comment: did eat marijuana brownie last week   Sexual activity: Not on file  Other Topics Concern   Not on file  Social History Narrative   Not on file   Social Determinants of Health   Financial Resource Strain: Not on file  Food Insecurity: Not on file  Transportation Needs: Not on file  Physical Activity: Not on file  Stress: Not on file  Social Connections: Not on file  Intimate Partner Violence: Not on file    Review of Systems  Respiratory:  Positive for chest tightness and shortness of breath.    Vitals:   10/07/20 0917   BP: 130/88  Pulse: 83  Temp: 97.8 F (36.6 C)  SpO2: 95%     Physical Exam Constitutional:      Appearance: Normal appearance.  HENT:     Head: Normocephalic.     Nose: No congestion.     Mouth/Throat:     Mouth: Mucous membranes are moist.  Cardiovascular:     Rate and Rhythm: Normal rate and regular rhythm.     Heart sounds: No murmur heard.   No friction rub.  Pulmonary:     Effort: No respiratory distress.     Breath sounds: No stridor. No wheezing or rhonchi.  Musculoskeletal:     Cervical back: No rigidity or tenderness.  Neurological:     Mental Status: She is alert.  Psychiatric:        Mood and Affect: Mood normal.     Data Reviewed: Chest x-ray is reviewed with the patient chest x-ray reviewed again today -Significant improvement since perioperative period  CT scan of the chest reviewed   Pulmonary function test is pending at present   Assessment:  Shortness of breath on exertion -Symptoms are about the same  Possible obstructive lung disease -Past history of smoking  Coronary artery disease Diastolic heart failure Diaphragmatic dysfunction  Plan:  .  Encouraged about graded exercises  .  Encouraged regarding inhaler use  .  Overall stable  .  We will see her back in 3 months  .  Importance of regular exercises to get back to her usual was discussed  Sherrilyn Rist MD [facility] 10/07/2020, 10:05 AM  CC: Revankar, Reita Cliche, MD

## 2020-11-18 NOTE — Patient Instructions (Signed)
I will see you back in about 3 months  Continue using your inhalers  Graded exercises as tolerated Make sure you are exercising regularly as tolerated  Call us with any significant concerns

## 2020-12-24 ENCOUNTER — Telehealth: Payer: Self-pay | Admitting: Cardiology

## 2020-12-24 MED ORDER — CLOPIDOGREL BISULFATE 75 MG PO TABS: 75.0000 mg | ORAL_TABLET | Freq: Every day | ORAL | 5 refills | Status: DC

## 2020-12-24 NOTE — Telephone Encounter (Signed)
Rx sent and pt is aware.

## 2020-12-24 NOTE — Telephone Encounter (Signed)
Patient was prescribed Clopidogrel 75 mg while she was in the hospital. She has 2 pills left and wanted to know if she needs a new RX for it. CB # [phone]

## 2021-01-08 ENCOUNTER — Telehealth: Payer: Self-pay | Admitting: Pulmonary Disease

## 2021-01-08 NOTE — Telephone Encounter (Signed)
Noted  

## 2021-02-17 ENCOUNTER — Other Ambulatory Visit: Payer: Self-pay

## 2021-02-18 ENCOUNTER — Ambulatory Visit: Payer: Medicare Other | Admitting: Pulmonary Disease

## 2021-02-18 ENCOUNTER — Ambulatory Visit (INDEPENDENT_AMBULATORY_CARE_PROVIDER_SITE_OTHER): Payer: Medicare Other | Admitting: Pulmonary Disease

## 2021-02-18 ENCOUNTER — Other Ambulatory Visit: Payer: Self-pay

## 2021-02-18 DIAGNOSIS — R0602 Shortness of breath: Secondary | ICD-10-CM | POA: Diagnosis not present

## 2021-02-18 LAB — PULMONARY FUNCTION TEST
DL/VA % pred: 129 %
DL/VA: 5.45 ml/min/mmHg/L
DLCO cor % pred: 78 %
DLCO cor: 15.91 ml/min/mmHg
DLCO unc % pred: 78 %
DLCO unc: 15.91 ml/min/mmHg
FEF 25-75 Post: 0.94 L/sec
FEF 25-75 Pre: 0.73 L/sec
FEF2575-%Change-Post: 27 %
FEF2575-%Pred-Post: 45 %
FEF2575-%Pred-Pre: 35 %
FEV1-%Change-Post: 14 %
FEV1-%Pred-Post: 54 %
FEV1-%Pred-Pre: 47 %
FEV1-Post: 1.13 L
FEV1-Pre: 0.99 L
FEV1FVC-%Change-Post: 2 %
FEV1FVC-%Pred-Pre: 85 %
FEV6-%Change-Post: 11 %
FEV6-%Pred-Post: 63 %
FEV6-%Pred-Pre: 57 %
FEV6-Post: 1.64 L
FEV6-Pre: 1.47 L
FEV6FVC-%Pred-Post: 103 %
FEV6FVC-%Pred-Pre: 103 %
FVC-%Change-Post: 11 %
FVC-%Pred-Post: 61 %
FVC-%Pred-Pre: 55 %
FVC-Post: 1.64 L
FVC-Pre: 1.47 L
Post FEV1/FVC ratio: 69 %
Post FEV6/FVC ratio: 100 %
Pre FEV1/FVC ratio: 68 %
Pre FEV6/FVC Ratio: 100 %
RV % pred: 99 %
RV: 1.98 L
TLC % pred: 71 %
TLC: 3.6 L

## 2021-02-18 NOTE — Progress Notes (Signed)
Full PFT performed today. °

## 2021-02-18 NOTE — Patient Instructions (Signed)
Full PFT performed today. °

## 2021-02-20 ENCOUNTER — Encounter: Payer: Self-pay | Admitting: Cardiology

## 2021-02-20 ENCOUNTER — Ambulatory Visit (INDEPENDENT_AMBULATORY_CARE_PROVIDER_SITE_OTHER): Payer: Medicare Other | Admitting: Cardiology

## 2021-02-20 ENCOUNTER — Other Ambulatory Visit: Payer: Self-pay

## 2021-02-20 VITALS — BP 132/80 | HR 94 | Ht 63.0 in | Wt 179.1 lb

## 2021-02-20 DIAGNOSIS — I10 Essential (primary) hypertension: Secondary | ICD-10-CM

## 2021-02-20 DIAGNOSIS — Z951 Presence of aortocoronary bypass graft: Secondary | ICD-10-CM

## 2021-02-20 DIAGNOSIS — I2510 Atherosclerotic heart disease of native coronary artery without angina pectoris: Secondary | ICD-10-CM | POA: Diagnosis not present

## 2021-02-20 DIAGNOSIS — E1140 Type 2 diabetes mellitus with diabetic neuropathy, unspecified: Secondary | ICD-10-CM

## 2021-02-20 DIAGNOSIS — E782 Mixed hyperlipidemia: Secondary | ICD-10-CM | POA: Diagnosis not present

## 2021-02-20 NOTE — Patient Instructions (Signed)

## 2021-02-20 NOTE — Progress Notes (Signed)
Cardiology Office Note:    Date:  02/20/2021   ID:  Sabrina Swanson, DOB March 10, 1960, MRN [id number]  PCP:  Wynelle Fanny, DO  Cardiologist:  Jenean Lindau, MD   Referring MD: Wynelle Fanny, DO    ASSESSMENT:    1. Coronary artery disease involving native coronary artery of native heart without angina pectoris   2. Mixed hyperlipidemia   3. Essential hypertension   4. S/P CABG x 3    PLAN:    In order of problems listed above:  Neri artery disease post CABG surgery: Secondary prevention stressed to the patient.  Importance of compliance with diet medication stressed and she vocalized understanding.  She was advised to walk at least half an hour a day 5 days a week and she promises to do so. Essential hypertension: Blood pressure stable and I discussed this with her Mixed dyslipidemia: Lipids were elevated and her statin was doubled.  She did not come for follow-up blood work.  Compliance urged.  She will have blood work today. Diabetes mellitus: Diet emphasized.  We will add hemoglobin A1c to her blood work and send it to her primary care for management. Patient will be seen in follow-up appointment in 6 months or earlier if the patient has any concerns    Medication Adjustments/Labs and Tests Ordered: Current medicines are reviewed at length with the patient today.  Concerns regarding medicines are outlined above.  No orders of the defined types were placed in this encounter.  No orders of the defined types were placed in this encounter.    No chief complaint on file.    History of Present Illness:    Sabrina Swanson is a 61 y.o. female.  Patient has past medical history of coronary artery disease post CABG surgery, essential hypertension, mixed dyslipidemia and diabetes mellitus.  She denies any problems at this time and takes care of activities of daily living.  No chest pain orthopnea or PND at the time of my evaluation, the patient is alert awake oriented and in no  distress.  She walks some on a regular basis.  Past Medical History:  Diagnosis Date   Abnormal stress test 04/16/2020   Acquired trigger finger 06/10/2012   Alopecia 05/16/2013   Angina pectoris ([place]) 11/13/2019   Anxiety state 06/10/2012   Arthritis of right acromioclavicular joint 11/27/2018   Back pain    Benign neoplasm of colon 06/10/2012   Bursitis disorder 05/11/2011   CAD (coronary artery disease), native coronary artery 04/22/2020   Cardiac murmur 11/13/2019   Carpal tunnel syndrome 06/10/2012   Formatting of this note might be different from the original. bilateral   Chalazion right upper eyelid 06/30/2015   Continuous dependence on cigarette smoking 11/13/2019   COPD (chronic obstructive pulmonary disease) ([place]) 05/16/2013   DDD (degenerative disc disease), lumbosacral 06/10/2012   Depressive disorder 06/10/2012   Diabetes mellitus due to underlying condition with hyperosmolarity without coma, without long-term current use of insulin ([place]) 04/16/2020   Diabetes mellitus without complication ([place])    Dyspareunia 03/04/2014   Dysthymia 06/10/2012   Encounter for long-term (current) use of other medications 06/10/2012   Essential hypertension 08/14/2019   Former smoker 05/21/2020   Generalized anxiety disorder 06/10/2012   Hammer toe of right foot 10/23/2014   Formatting of this note might be different from the original. Second and third tarsals Formatting of this note might be different from the original. Formatting of this note might be different from the original. Second and  third tarsals   High cholesterol    Hypercholesterolemia 03/06/2013   Hypertension    Insomnia 06/10/2012   Lumbosacral spondylosis 06/10/2012   Lump of skin 10/23/2014   Formatting of this note might be different from the original. Right breast-manipulated by patient prior to clinical exam   Migraine syndrome 06/10/2012   Mixed hyperlipidemia 04/16/2020   Numbness of toes 10/23/2014   Osteoarthrosis, generalized, involving multiple  sites 06/10/2012   Pain in soft tissues of limb 06/10/2012   Formatting of this note might be different from the original. 03/09/11 right arm.   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   Sensorineural hearing loss 06/10/2012   Shoulder impingement, right 11/27/2018   Subacute vaginitis 07/18/2015   Tendinopathy of rotator cuff, right 11/27/2018   Tinnitus 03/21/2013   Tobacco abuse 04/16/2020   Tobacco use disorder 03/06/2013   Type 2 diabetes, controlled, with neuropathy ([place]) 10/21/2014   Unspecified cataract 06/30/2015   Vertigo 06/14/2013   Vestibular dizziness 06/10/2012    Past Surgical History:  Procedure Laterality Date   ABDOMINAL HYSTERECTOMY     CESAREAN SECTION     CORONARY ARTERY BYPASS GRAFT N/A 04/23/2020   Procedure: CORONARY ARTERY BYPASS GRAFTING (CABG), ON PUMP, TIMES THREE, USING LEFT INTERNAL MAMMARY ARTERY AND ENDOSCOPICALLY HARVESTED RIGHT GREATER SAPHENOUS VEIN;  Surgeon: [name], MD;  Location: [facility];  Service: Open Heart Surgery;  Laterality: N/A;   LEFT HEART CATH AND CORONARY ANGIOGRAPHY N/A 04/22/2020   Procedure: LEFT HEART CATH AND CORONARY ANGIOGRAPHY;  Surgeon: Troy Sine, MD;  Location: [facility];  Service: Cardiovascular;  Laterality: N/A;   TEE WITHOUT CARDIOVERSION N/A 04/23/2020   Procedure: TRANSESOPHAGEAL ECHOCARDIOGRAM (TEE);  Surgeon: [name], MD;  Location: [facility];  Service: Open Heart Surgery;  Laterality: N/A;    Current Medications: Current Meds  Medication Sig   albuterol (VENTOLIN HFA) 108 (90 Base) MCG/ACT inhaler Inhale 2 puffs into the lungs every 4 (four) hours as needed for shortness of breath.   aspirin 81 MG EC tablet Take 81 mg by mouth daily.   atorvastatin (LIPITOR) 80 MG tablet Take 0.5 tablets (40 mg total) by mouth daily.   carvedilol (COREG) 25 MG  tablet Take 1 tablet (25 mg total) by mouth 2 (two) times daily with a meal.   clopidogrel (PLAVIX) 75 MG tablet Take 1 tablet (75 mg total) by mouth daily.   dapagliflozin propanediol (FARXIGA) 10 MG TABS tablet Take 1 tablet (10 mg total) by mouth daily before breakfast.   ergocalciferol (VITAMIN D2) 1.25 MG (50000 UT) capsule Take 50,000 Units by mouth every 7 (seven) days.   Ipratropium-Albuterol (COMBIVENT) 20-100 MCG/ACT AERS respimat Inhale 1 puff into the lungs daily in the afternoon.   levofloxacin (LEVAQUIN) 500 MG tablet Take 500 mg by mouth daily.   losartan (COZAAR) 25 MG tablet Take 25 mg by mouth 2 (two) times daily.   metFORMIN (GLUCOPHAGE-XR) 500 MG 24 hr tablet Take 500 mg by mouth daily.   NARCAN 4 MG/0.1ML LIQD nasal spray kit Place 4 mg into the nose once.   nitroGLYCERIN (NITROSTAT) 0.4 MG SL tablet Place 0.4 mg under the tongue every 5 (five) minutes as needed for chest pain.   Omega-3 Fatty Acids (FISH OIL) 1000 MG CAPS Take 1,000 mg by mouth  daily.   oxyCODONE-acetaminophen (PERCOCET) 10-325 MG tablet Take 1 tablet by mouth every 8 (eight) hours.   vitamin B-12 (CYANOCOBALAMIN) 1000 MCG tablet Take 1,000 mcg by mouth daily.     Allergies:   Acetaminophen   Social History   Socioeconomic History   Marital status: Single    Spouse name: Not on file   Number of children: Not on file   Years of education: Not on file   Highest education level: Not on file  Occupational History   Not on file  Tobacco Use   Smoking status: Former    Packs/day: 1.00    Types: Cigarettes    Quit date: 04/22/2020    Years since quitting: 0.8   Smokeless tobacco: Never  Vaping Use   Vaping Use: Never used  Substance and Sexual Activity   Alcohol use: Yes    Comment: occasional   Drug use: No    Comment: did eat marijuana brownie last week   Sexual activity: Not on file  Other Topics Concern   Not on file  Social History Narrative   Not on file   Social Determinants of  Health   Financial Resource Strain: Not on file  Food Insecurity: Not on file  Transportation Needs: Not on file  Physical Activity: Not on file  Stress: Not on file  Social Connections: Not on file     Family History: The patient's family history includes Diabetes in her maternal grandfather; Hypertension in her maternal grandmother; Stomach cancer in her mother.  ROS:   Please see the history of present illness.    All other systems reviewed and are negative.  EKGs/Labs/Other Studies Reviewed:    The following studies were reviewed today: EKG reveals sinus rhythm and nonspecific ST-T changes done in the past   Recent Labs: 04/24/2020: Magnesium 2.4 08/26/2020: ALT 21; BUN 9; Creatinine, Ser 0.82; Hemoglobin 14.9; Platelets 256; Potassium 4.8; Sodium 140; TSH 1.480  Recent Lipid Panel    Component Value Date/Time   CHOL 191 08/26/2020 0908   TRIG 101 08/26/2020 0908   HDL 41 08/26/2020 0908   CHOLHDL 4.7 (H) 08/26/2020 0908   CHOLHDL 5.5 05/01/2020 0025   VLDL 22 05/01/2020 0025   LDLCALC 132 (H) 08/26/2020 0908    Physical Exam:    VS:  BP 132/80    Pulse 94    Ht 5' 3" (1.6 m)    Wt 179 lb 1.9 oz (81.2 kg)    SpO2 95%    BMI 31.73 kg/m     Wt Readings from Last 3 Encounters:  02/20/21 179 lb 1.9 oz (81.2 kg)  11/18/20 176 lb (79.8 kg)  10/07/20 174 lb 9.6 oz (79.2 kg)     GEN: Patient is in no acute distress HEENT: Normal NECK: No JVD; No carotid bruits LYMPHATICS: No lymphadenopathy CARDIAC: Hear sounds regular, 2/6 systolic murmur at the apex. RESPIRATORY:  Clear to auscultation without rales, wheezing or rhonchi  ABDOMEN: Soft, non-tender, non-distended MUSCULOSKELETAL:  No edema; No deformity  SKIN: Warm and dry NEUROLOGIC:  Alert and oriented x 3 PSYCHIATRIC:  Normal affect   Signed, Jenean Lindau, MD  02/20/2021 8:58 AM    [place]

## 2021-02-21 LAB — LIPID PANEL
Chol/HDL Ratio: 4.4 ratio (ref 0.0–4.4)
Cholesterol, Total: 153 mg/dL (ref 100–199)
HDL: 35 mg/dL — ABNORMAL LOW (ref >39)
LDL Chol Calc (NIH): 100 mg/dL — ABNORMAL HIGH (ref 0–99)
Triglycerides: 93 mg/dL (ref 0–149)
VLDL Cholesterol Cal: 18 mg/dL (ref 5–40)

## 2021-02-21 LAB — BASIC METABOLIC PANEL
BUN/Creatinine Ratio: 11 — ABNORMAL LOW (ref 12–28)
BUN: 10 mg/dL (ref 8–27)
CO2: 23 mmol/L (ref 20–29)
Calcium: 9.7 mg/dL (ref 8.7–10.3)
Chloride: 106 mmol/L (ref 96–106)
Creatinine, Ser: 0.87 mg/dL (ref 0.57–1.00)
Glucose: 146 mg/dL — ABNORMAL HIGH (ref 70–99)
Potassium: 4.5 mmol/L (ref 3.5–5.2)
Sodium: 141 mmol/L (ref 134–144)
eGFR: 76 mL/min/{1.73_m2} (ref >59)

## 2021-02-21 LAB — HEPATIC FUNCTION PANEL
ALT: 44 IU/L — ABNORMAL HIGH (ref 0–32)
AST: 25 IU/L (ref 0–40)
Albumin: 4.7 g/dL (ref 3.8–4.9)
Alkaline Phosphatase: 117 IU/L (ref 44–121)
Bilirubin Total: 0.2 mg/dL (ref 0.0–1.2)
Bilirubin, Direct: <0.1 mg/dL (ref 0.00–0.40)
Total Protein: 7.3 g/dL (ref 6.0–8.5)

## 2021-02-21 LAB — HEMOGLOBIN A1C
Est. average glucose Bld gHb Est-mCnc: 189 mg/dL
Hgb A1c MFr Bld: 8.2 % — ABNORMAL HIGH (ref 4.8–5.6)

## 2021-02-23 ENCOUNTER — Telehealth: Payer: Self-pay

## 2021-02-23 NOTE — Telephone Encounter (Signed)
-----   Message from Jenean Lindau, MD sent at 02/23/2021  9:32 AM EST ----- A1 is up and lft is up. Needs to talk to pcp. OtherwiseThe results of the study is unremarkable. Please inform patient. I will discuss in detail at next appointment. Cc  primary care/referring physician Jenean Lindau, MD 02/23/2021 9:32 AM

## 2021-02-23 NOTE — Telephone Encounter (Signed)
Spoke with patient regarding results and recommendation.  Patient verbalizes understanding and is agreeable to plan of care. Advised patient to call back with any issues or concerns.  

## 2021-03-04 ENCOUNTER — Ambulatory Visit: Payer: Medicare Other | Admitting: Cardiology

## 2021-03-10 ENCOUNTER — Telehealth: Payer: Self-pay | Admitting: Pulmonary Disease

## 2021-03-10 NOTE — Telephone Encounter (Signed)
Fax received from [facility] about urgent physician order that needed to be signed. This is in regards to O2 stating the dosing is for 3L cont. At pt's last OV with Dr. Jenetta Downer, there was no documentation on pt being or needing O2. Called and spoke with pt about this order and stated to her that we had no documentation that she was on any O2 and due to this, she would need to be seen for an appt in order for [place] to see if we are able to have provider sign the form. ? ?Pt stated that she believes it was cardiology who placed her on the O2. Pt was okay with scheduling a sooner appt with provider so has been scheduled for an appt with Roxan Diesel, NP 3/16 at 9:30. Stated to pt that we would need to reevaluate at that time to see if she still needed the O2 as if she does, we will be able to get the form signed at that time. Nothing further needed. ?

## 2021-03-19 ENCOUNTER — Encounter: Payer: Self-pay | Admitting: Nurse Practitioner

## 2021-03-19 ENCOUNTER — Ambulatory Visit (INDEPENDENT_AMBULATORY_CARE_PROVIDER_SITE_OTHER): Payer: Medicare Other | Admitting: Nurse Practitioner

## 2021-03-19 ENCOUNTER — Telehealth: Payer: Self-pay | Admitting: Nurse Practitioner

## 2021-03-19 ENCOUNTER — Other Ambulatory Visit: Payer: Self-pay

## 2021-03-19 VITALS — BP 138/76 | HR 80

## 2021-03-19 DIAGNOSIS — J449 Chronic obstructive pulmonary disease, unspecified: Secondary | ICD-10-CM

## 2021-03-19 DIAGNOSIS — R0683 Snoring: Secondary | ICD-10-CM | POA: Insufficient documentation

## 2021-03-19 DIAGNOSIS — I2510 Atherosclerotic heart disease of native coronary artery without angina pectoris: Secondary | ICD-10-CM

## 2021-03-19 DIAGNOSIS — J9611 Chronic respiratory failure with hypoxia: Secondary | ICD-10-CM | POA: Insufficient documentation

## 2021-03-19 DIAGNOSIS — R5382 Chronic fatigue, unspecified: Secondary | ICD-10-CM

## 2021-03-19 HISTORY — DX: Snoring: R06.83

## 2021-03-19 HISTORY — DX: Chronic respiratory failure with hypoxia: J96.11

## 2021-03-19 MED ORDER — FLUTICASONE FUROATE-VILANTEROL 100-25 MCG/ACT IN AEPB: 1.0000 | INHALATION_SPRAY | Freq: Every day | RESPIRATORY_TRACT | 5 refills | Status: DC

## 2021-03-19 NOTE — Assessment & Plan Note (Signed)
Originally prescribed O2 by cardiology. Walking oximetry today with qualification for continuation of supplemental O2 at 2 lpm with activity. Order sent to DME for [place] qualification. Discussed the importance of compliance with oxygen therapy. Goal SpO2 >88-90% ?

## 2021-03-19 NOTE — Patient Instructions (Addendum)
-  Continue Albuterol inhaler 2 puffs every 4 hours as needed for shortness of breath or wheezing. Notify if symptoms persist despite rescue inhaler/neb use. ?-Stop Combivent. Start Breo 1 puff daily. Brush tongue and rinse mouth afterwards. ?-Continue supplemental oxygen therapy at 2 lpm with activity. Goal oxygen level >88-90% ? ?Split night sleep study ordered. Someone will contact you for scheduling.  ? ?Referral to pulmonary rehab placed today. ? ?Follow up in 4-6 weeks after sleep study with Dr. Ander Slade or Alanson Aly. If symptoms do not improve or worsen, please contact office for sooner follow up or seek emergency care. ?

## 2021-03-19 NOTE — Telephone Encounter (Signed)
Patient is aware of below message and voiced her understanding.  ?Order placed to [employer] for POC eval.  ?Nothing further needed.  ? ?

## 2021-03-19 NOTE — Progress Notes (Signed)
? ?_0  ID: Sabrina Swanson, female    DOB: 03/06/1960, 61 y.o.   MRN: [id number] ? ?No chief complaint on file. ? ? ?Referring provider: ?Wynelle Fanny, DO ? ?HPI: ?61 year old female, former smoker followed for moderate COPD/asthma and chronic respiratory failure. She is a patient of Dr. Judson Roch and last seen in office on 11/18/2020. Past medical history significant for CAD s/p CABG, HTN, migraines, DM, DDD, HLD, anxiety, depression, insomnia, HLD.  ? ?TEST/EVENTS:  ?08/26/2020 CXR 2 view: persistent elevation of left hemidiaphragm, blunting of left costophrenic angle. Improved overall aeration with mild persistent left basilar atelectasis/scarring ?02/18/2021 PFTs: FVC 1.64 (61), FEV1 1.13 (54), ratio 69, TLC 71%, DLCOcor 78%. Positive BD (14%). Moderate obstructive airway disease with reversibility and mild restriction with minimal diffusion defect.  ? ?11/18/2020: OV with Dr. Ander Slade.  Referred in October for evaluation of shortness of breath after CABG.  There was concern for possible pneumonia postsurgery.  During this OV, she reported that her breathing felt about the same as it did previously.  Only using Combivent and albuterol as needed.  Does have some chest discomfort with activity.  PFTs were pending.  Did have mention of COPD in her history however reported that her shortness of breath did not start till after her cardiac surgery.  Noted to have some diaphragmatic dysfunction on her chest x-ray.  Advised graded exercises and taught proper inhaler technique. ? ?03/19/2021: Today-follow-up ?Patient presents today for follow-up after PFTs and to requalify for oxygen therapy.  She was previously started on this by cardiology after her surgery.  Her pulmonary function testing showed moderate obstruction with a 14% bronchodilator response and mild restriction.  She usually wears her oxygen as needed at home when she feels short of breath.  Does not routinely check her oxygen levels. Her activity tolerance is  relatively unchanged compared to when she was seen last.  She continues to have shortness of breath with long distances and feels as though she gets winded more easily than she used to.  She does notice some wheezing as well.  She denies any cough, orthopnea, PND or lower extremity swelling.  She uses her albuterol and Combivent every other day to a few times a week. Doesn't notice a significant difference with Combivent versus albuterol. Does reports some snoring at night and daytime fatigue symptoms as well. She denies drowsy driving, morning headaches or narcolepsy. ? ?Allergies  ?Allergen Reactions  ? Acetaminophen Itching and Nausea Only  ? ? ?Immunization History  ?Administered Date(s) Administered  ? Influenza Split 12/20/2011  ? Influenza, Seasonal, Injecte, Preservative Fre 04/03/2012  ? Influenza-Unspecified 12/20/2011, 04/03/2012, 10/18/2020  ? Moderna Sars-Covid-2 Vaccination 04/17/2019, 05/15/2019, 01/02/2020  ? PPD Test 05/03/2016  ? Pneumococcal Polysaccharide-23 12/20/2011, 04/03/2012  ? Pneumococcal-Unspecified 05/19/2015  ? Tetanus 05/19/2015  ? ? ?Past Medical History:  ?Diagnosis Date  ? Abnormal stress test 04/16/2020  ? Acquired trigger finger 06/10/2012  ? Alopecia 05/16/2013  ? Angina pectoris ([place]) 11/13/2019  ? Anxiety state 06/10/2012  ? Arthritis of right acromioclavicular joint 11/27/2018  ? Back pain   ? Benign neoplasm of colon 06/10/2012  ? Bursitis disorder 05/11/2011  ? CAD (coronary artery disease), native coronary artery 04/22/2020  ? Cardiac murmur 11/13/2019  ? Carpal tunnel syndrome 06/10/2012  ? Formatting of this note might be different from the original. bilateral  ? Chalazion right upper eyelid 06/30/2015  ? Continuous dependence on cigarette smoking 11/13/2019  ? COPD (chronic obstructive pulmonary disease) ([place]) 05/16/2013  ?  DDD (degenerative disc disease), lumbosacral 06/10/2012  ? Depressive disorder 06/10/2012  ? Diabetes mellitus due to underlying condition with hyperosmolarity without  coma, without long-term current use of insulin ([place]) 04/16/2020  ? Diabetes mellitus without complication ([place])   ? Dyspareunia 03/04/2014  ? Dysthymia 06/10/2012  ? Encounter for long-term (current) use of other medications 06/10/2012  ? Essential hypertension 08/14/2019  ? Former smoker 05/21/2020  ? Generalized anxiety disorder 06/10/2012  ? Hammer toe of right foot 10/23/2014  ? Formatting of this note might be different from the original. Second and third tarsals Formatting of this note might be different from the original. Formatting of this note might be different from the original. Second and third tarsals  ? High cholesterol   ? Hypercholesterolemia 03/06/2013  ? Hypertension   ? Insomnia 06/10/2012  ? Lumbosacral spondylosis 06/10/2012  ? Lump of skin 10/23/2014  ? Formatting of this note might be different from the original. Right breast-manipulated by patient prior to clinical exam  ? Migraine syndrome 06/10/2012  ? Mixed hyperlipidemia 04/16/2020  ? Numbness of toes 10/23/2014  ? Osteoarthrosis, generalized, involving multiple sites 06/10/2012  ? Pain in soft tissues of limb 06/10/2012  ? Formatting of this note might be different from the original. 03/09/11 right arm.  ? Pain in toes of both feet 10/23/2014  ? Patellar tendinitis 06/10/2012  ? Persistent lymphocytosis 05/25/2014  ? Plantar fascial fibromatosis 06/10/2012  ? Risk for falls 03/05/2015  ? S/P CABG x 3 04/23/2020  ? Sensorineural hearing loss 06/10/2012  ? Shoulder impingement, right 11/27/2018  ? Subacute vaginitis 07/18/2015  ? Tendinopathy of rotator cuff, right 11/27/2018  ? Tinnitus 03/21/2013  ? Tobacco abuse 04/16/2020  ? Tobacco use disorder 03/06/2013  ? Type 2 diabetes, controlled, with neuropathy ([place]) 10/21/2014  ? Unspecified cataract 06/30/2015  ? Vertigo 06/14/2013  ? Vestibular dizziness 06/10/2012  ? ? ?Tobacco History: ?Social History  ? ?Tobacco Use  ?Smoking Status Former  ? Packs/day: 1.00  ? Types: Cigarettes  ? Quit date: 04/22/2020  ? Years since quitting: 0.9   ?Smokeless Tobacco Never  ? ?Counseling given: Not Answered ? ? ?Outpatient Medications Prior to Visit  ?Medication Sig Dispense Refill  ? albuterol (VENTOLIN HFA) 108 (90 Base) MCG/ACT inhaler Inhale 2 puffs into the lungs every 4 (four) hours as needed for shortness of breath.    ? aspirin 81 MG EC tablet Take 81 mg by mouth daily.    ? atorvastatin (LIPITOR) 80 MG tablet Take 0.5 tablets (40 mg total) by mouth daily. 30 tablet 11  ? carvedilol (COREG) 25 MG tablet Take 1 tablet (25 mg total) by mouth 2 (two) times daily with a meal. 60 tablet 2  ? clopidogrel (PLAVIX) 75 MG tablet Take 1 tablet (75 mg total) by mouth daily. 30 tablet 5  ? dapagliflozin propanediol (FARXIGA) 10 MG TABS tablet Take 1 tablet (10 mg total) by mouth daily before breakfast. 30 tablet 2  ? ergocalciferol (VITAMIN D2) 1.25 MG (50000 UT) capsule Take 50,000 Units by mouth every 7 (seven) days.    ? Ipratropium-Albuterol (COMBIVENT) 20-100 MCG/ACT AERS respimat Inhale 1 puff into the lungs daily in the afternoon.    ? levofloxacin (LEVAQUIN) 500 MG tablet Take 500 mg by mouth daily.    ? losartan (COZAAR) 25 MG tablet Take 25 mg by mouth 2 (two) times daily.    ? metFORMIN (GLUCOPHAGE-XR) 500 MG 24 hr tablet Take 500 mg by mouth daily.    ?  NARCAN 4 MG/0.1ML LIQD nasal spray kit Place 4 mg into the nose once.    ? nitroGLYCERIN (NITROSTAT) 0.4 MG SL tablet Place 0.4 mg under the tongue every 5 (five) minutes as needed for chest pain.    ? Omega-3 Fatty Acids (FISH OIL) 1000 MG CAPS Take 1,000 mg by mouth daily.    ? oxyCODONE-acetaminophen (PERCOCET) 10-325 MG tablet Take 1 tablet by mouth every 8 (eight) hours.    ? vitamin B-12 (CYANOCOBALAMIN) 1000 MCG tablet Take 1,000 mcg by mouth daily.    ? ?No facility-administered medications prior to visit.  ? ? ? ?Review of Systems:  ? ?Constitutional: No weight loss or gain, night sweats, fevers, chills. +daytime fatigue ?HEENT: No headaches, difficulty swallowing, tooth/dental problems, or  sore throat. No ear ache, nasal congestion, or post nasal drip. +occasional allergy symptoms ?CV:  No chest pain, orthopnea, PND, swelling in lower extremities, anasarca, dizziness, palpitations, syncop

## 2021-03-19 NOTE — Assessment & Plan Note (Signed)
S/p CABG in 04/2020. Difficulties with DOE since surgery, requiring supplemental O2. Suspect there is a mixed component r/t CAD/heart disease and COPD/asthma. Follow up with cardiology as scheduled.  ?

## 2021-03-19 NOTE — Assessment & Plan Note (Addendum)
Occasional snoring and persistent daytime fatigue symptoms. Concerned the patient may have obstructive sleep apnea. Given extensive cardiac history and current O2 requirement with activity, split night study ordered for further evaluation.  ?

## 2021-03-19 NOTE — Telephone Encounter (Signed)
I called and was not able to leave a message for the patient. I let Sabrina Swanson know that we could move the referral to [facility]. Waiting on a call back. ?

## 2021-03-19 NOTE — Telephone Encounter (Signed)
Spoke to patient and relayed below message. She voiced her understanding.  ?She is requesting an order to be placed to [facility] for POC.  ? ?Joellen Jersey, please advise. Thanks ?

## 2021-03-19 NOTE — Telephone Encounter (Signed)
Patient is returning phone call. Patient phone number is [phone]. 

## 2021-03-19 NOTE — Telephone Encounter (Signed)
She has to have [place] qualification first. Our's is not functioning properly so unable to do at the visit. The order should have been put in at the visit. If not, can you please place an order for the DME to do walk for qualification? Thanks! ?

## 2021-03-19 NOTE — Assessment & Plan Note (Addendum)
Evidence of moderate obstruction with reversibility on recent PFTs (14% change). Very mild eosinophilia on CBC with diff (0.1) and has wheezing/allergy type symptoms at times. Has not noticed much relief with Combivent. DOE is stable overall but not at her baseline since having CABG. Will trial with ICS/LABA therapy. Referral to pulmonary rehab placed today. ? ?Patient Instructions  ?-Continue Albuterol inhaler 2 puffs every 4 hours as needed for shortness of breath or wheezing. Notify if symptoms persist despite rescue inhaler/neb use. ?-Stop Combivent. Start Breo 1 puff daily. Brush tongue and rinse mouth afterwards. ?-Continue supplemental oxygen therapy at 2 lpm with activity. Goal oxygen level >88-90% ? ?Split night sleep study ordered. Someone will contact you for scheduling.  ? ?Referral to pulmonary rehab placed today. ? ?Follow up in 4-6 weeks after sleep study with Dr. Ander Slade or Alanson Aly. If symptoms do not improve or worsen, please contact office for sooner follow up or seek emergency care. ? ? ?

## 2021-03-20 ENCOUNTER — Telehealth: Payer: Self-pay | Admitting: Nurse Practitioner

## 2021-03-20 DIAGNOSIS — J449 Chronic obstructive pulmonary disease, unspecified: Secondary | ICD-10-CM

## 2021-03-20 NOTE — Telephone Encounter (Signed)
Called and spoke with patient, she states that someone from [facility] stating that they cannot train her on a POC.  She states that she did not walk on the POC in the office because it was not working.  Advised I would call [employer] and get clarification on what they are needing.  I let her know that we would call her back once we determine what [employer] is needing.  She verbalized understanding. ? ?I called and left a vm for [employer].  Will await return call. ?

## 2021-03-23 NOTE — Telephone Encounter (Signed)
Our POC was not working when she was seen in office. She had a walk that day and qualified for 2 lpm continuous but needs the walk for POC. If our's is back up and running, she can come into the office just for POC walk; however, the order to the DME company was supposed to be for [place] qualification. Thanks!

## 2021-03-23 NOTE — Telephone Encounter (Signed)
Spoke with Sabrina Swanson who had no idea what information [employer] needed. Called [employer] who stated they had neither a POC order nor a qualifying POC walk. Sabrina Swanson would have to do qualifying walk in office as [employer] does not do those. Kattie Cobb please advise.  ?

## 2021-03-24 ENCOUNTER — Telehealth: Payer: Self-pay | Admitting: *Deleted

## 2021-03-24 ENCOUNTER — Telehealth: Payer: Self-pay | Admitting: Pulmonary Disease

## 2021-03-24 DIAGNOSIS — J449 Chronic obstructive pulmonary disease, unspecified: Secondary | ICD-10-CM

## 2021-03-24 NOTE — Telephone Encounter (Signed)
Please send to [facility] - [phone] ? ?Thanks!

## 2021-03-24 NOTE — Telephone Encounter (Signed)
Order placed to [facility]. Patient is aware and voiced her understanding.  ?She will call once setup with [facility] so [employer] order can be canceled.  ?Nothing further needed.  ? ?

## 2021-03-24 NOTE — Telephone Encounter (Signed)
Received call from [place] with [employer]  [phone]: [phone] ? ?Returned call to [employer].  Spoke with Cassie and she was stating that the patient's portable oxygen had been picked up because they did not have the documentation they needed for her to keep it.  She stated that she needed a qualifying walk, an office note and a new order.  I then asked her how she was going to come to the office without portable office for a walk if she had no portable oxygen tanks.  She then stated that it was pending pickup.  I advised her that her oxygen saturations with exertion are 84% on RA and they cannot pick up her oxygen.  I let her know that it looks like we are sending the orders to [facility] and once she is established we will cancel the orders with [employer].  I advised her I would clarify with the patient and if we still need to get orders and documentation to them we will send it.  She verbalized understanding. ? ?I reached out to the patient regarding the change in oxygen companies.  She states she was told by [employer] that they did not have what she needed as far as a POC.  I thanked her for the clarification.  Nothing further needed. ?

## 2021-03-24 NOTE — Telephone Encounter (Signed)
Called and spoke to pt. Informed her of the reason for delay. Pt was frustrated that she has not received anything yet.  ? ?Joellen Jersey, can we send new order to [employer] to provide pt with O2 (home concentrator) and also eval for POC/best portable unit? We can cancel order with [employer]. Thanks! ?

## 2021-03-24 NOTE — Telephone Encounter (Signed)
Called and spoke to pt. Advised pt per her chart that her Memory [facility] was electronically sent to [facility] on [address] in [place]. Pt states she will get it from that [facility] as [facility]. Nothing further needed at this time.  ?

## 2021-03-25 NOTE — Addendum Note (Signed)
Addended by: Dessie Coma on: 03/25/2021 12:16 PM ? ? Modules accepted: Orders ? ?

## 2021-03-25 NOTE — Telephone Encounter (Signed)
I have placed the order for the Pulmonary referral under the provider.  ?

## 2021-03-27 ENCOUNTER — Telehealth: Payer: Self-pay | Admitting: Pulmonary Disease

## 2021-03-27 NOTE — Telephone Encounter (Signed)
Per the order with Dr. Jenetta Downer the flow is 2 Liters and the patient is aware.  ?

## 2021-03-27 NOTE — Telephone Encounter (Signed)
Called and spoke to patient in regards to POC machine patient unable to get machine due to address being incorrect . I have updated her address and instructed her to give [employer] a call to let them know this has been done. Nothing further needed.  ?

## 2021-04-02 ENCOUNTER — Encounter: Payer: Self-pay | Admitting: Pulmonary Disease

## 2021-04-02 ENCOUNTER — Telehealth (HOSPITAL_COMMUNITY): Payer: Self-pay

## 2021-04-02 ENCOUNTER — Ambulatory Visit (INDEPENDENT_AMBULATORY_CARE_PROVIDER_SITE_OTHER): Payer: Medicare Other | Admitting: Pulmonary Disease

## 2021-04-02 ENCOUNTER — Telehealth: Payer: Self-pay | Admitting: Pulmonary Disease

## 2021-04-02 VITALS — BP 124/80 | HR 72 | Temp 98.0°F | Ht 63.0 in | Wt 184.0 lb

## 2021-04-02 DIAGNOSIS — R0602 Shortness of breath: Secondary | ICD-10-CM | POA: Diagnosis not present

## 2021-04-02 DIAGNOSIS — J9611 Chronic respiratory failure with hypoxia: Secondary | ICD-10-CM

## 2021-04-02 DIAGNOSIS — J449 Chronic obstructive pulmonary disease, unspecified: Secondary | ICD-10-CM | POA: Diagnosis not present

## 2021-04-02 NOTE — Patient Instructions (Addendum)
Continue Breo on a daily basis ? ?Continue oxygen supplementation ? ?Check your oxygen frequently with your pulse oximeter to help you guide how much oxygen you need-you need to keep the oxygen above 90 almost all the time ? ?Regular graded exercises as tolerated ? ?Call with significant concerns ? ?Follow-up in 5 to 6 months ?

## 2021-04-02 NOTE — Telephone Encounter (Signed)
Called patient and she is wondering if she could do cardiac rehab in [facility].  ? ?Please advise Dr Jenetta Downer   ?

## 2021-04-02 NOTE — Progress Notes (Signed)
? ?      ?Sabrina Swanson    [id number]    [birth year] ? ?Primary Care Physician:Lim, Sheri, DO ? ?Referring Physician: Wynelle Fanny, DO ?[address] ?[place],  [place] [address] ? ?Chief complaint:   ?In for follow-up today for shortness of breath ?Shortness of breath developed postcardiac surgery ? ?HPI: ? ?She feels relatively well ?Has been using oxygen supplementation ?Has a portable oxygen concentrator that she uses ? ?Sometimes she does leave the oxygen off while trying to do chores ? ?Encouraged to use oxygen around-the-clock especially when doing chores ?Encouraged to use a pulse ox to guide I will respect to when she can be off the oxygen supplementation ? ?Overall she continues to do well ? ?Between the last time that she was here and presently she had a pulmonary function test performed showing obstructive lung disease with significant bronchodilator response ? ?She remains on Breo ? ?She is not back to exercising on a regular basis and she is having discussions with her primary doctor about physical therapy ?Denies significant shortness of breath at rest ? ?Inhaler technique was suboptimal-she was taught how to use the inhalers better ? ?Previous chest x-ray findings showing hypoventilation, multifocal infiltrate, elevated left hemidiaphragm which has progressively improved ? ?Reformed smoker ?No pertinent occupational history ? ?chronic obstructive pulmonary disease ?She was not having significant shortness of breath prior to diagnosis of coronary artery disease ? ? ?Outpatient Encounter Medications as of 04/02/2021  ?Medication Sig  ? albuterol (VENTOLIN HFA) 108 (90 Base) MCG/ACT inhaler Inhale 2 puffs into the lungs every 4 (four) hours as needed for shortness of breath.  ? aspirin 81 MG EC tablet Take 81 mg by mouth daily.  ? atorvastatin (LIPITOR) 80 MG tablet Take 0.5 tablets (40 mg total) by mouth daily.  ? carvedilol (COREG) 25 MG tablet Take 1 tablet (25 mg total) by mouth 2 (two) times  daily with a meal.  ? clopidogrel (PLAVIX) 75 MG tablet Take 1 tablet (75 mg total) by mouth daily.  ? dapagliflozin propanediol (FARXIGA) 10 MG TABS tablet Take 1 tablet (10 mg total) by mouth daily before breakfast.  ? ergocalciferol (VITAMIN D2) 1.25 MG (50000 UT) capsule Take 50,000 Units by mouth every 7 (seven) days.  ? fluticasone furoate-vilanterol (BREO ELLIPTA) 100-25 MCG/ACT AEPB Inhale 1 puff into the lungs daily.  ? Ipratropium-Albuterol (COMBIVENT) 20-100 MCG/ACT AERS respimat Inhale 1 puff into the lungs daily in the afternoon.  ? levofloxacin (LEVAQUIN) 500 MG tablet Take 500 mg by mouth daily.  ? losartan (COZAAR) 25 MG tablet Take 25 mg by mouth 2 (two) times daily.  ? metFORMIN (GLUCOPHAGE-XR) 500 MG 24 hr tablet Take 500 mg by mouth daily.  ? NARCAN 4 MG/0.1ML LIQD nasal spray kit Place 4 mg into the nose once.  ? nitroGLYCERIN (NITROSTAT) 0.4 MG SL tablet Place 0.4 mg under the tongue every 5 (five) minutes as needed for chest pain.  ? Omega-3 Fatty Acids (FISH OIL) 1000 MG CAPS Take 1,000 mg by mouth daily.  ? oxyCODONE-acetaminophen (PERCOCET) 10-325 MG tablet Take 1 tablet by mouth every 8 (eight) hours.  ? vitamin B-12 (CYANOCOBALAMIN) 1000 MCG tablet Take 1,000 mcg by mouth daily.  ? ?No facility-administered encounter medications on file as of 04/02/2021.  ? ? ?Allergies as of 04/02/2021 - Review Complete 04/02/2021  ?Allergen Reaction Noted  ? Acetaminophen Itching and Nausea Only 07/13/2016  ? ? ?Past Medical History:  ?Diagnosis Date  ? Abnormal stress  test 04/16/2020  ? Acquired trigger finger 06/10/2012  ? Alopecia 05/16/2013  ? Angina pectoris ([place]) 11/13/2019  ? Anxiety state 06/10/2012  ? Arthritis of right acromioclavicular joint 11/27/2018  ? Back pain   ? Benign neoplasm of colon 06/10/2012  ? Bursitis disorder 05/11/2011  ? CAD (coronary artery disease), native coronary artery 04/22/2020  ? Cardiac murmur 11/13/2019  ? Carpal tunnel syndrome 06/10/2012  ? Formatting of this note might be  different from the original. bilateral  ? Chalazion right upper eyelid 06/30/2015  ? Continuous dependence on cigarette smoking 11/13/2019  ? COPD (chronic obstructive pulmonary disease) ([place]) 05/16/2013  ? DDD (degenerative disc disease), lumbosacral 06/10/2012  ? Depressive disorder 06/10/2012  ? Diabetes mellitus due to underlying condition with hyperosmolarity without coma, without long-term current use of insulin ([place]) 04/16/2020  ? Diabetes mellitus without complication ([place])   ? Dyspareunia 03/04/2014  ? Dysthymia 06/10/2012  ? Encounter for long-term (current) use of other medications 06/10/2012  ? Essential hypertension 08/14/2019  ? Former smoker 05/21/2020  ? Generalized anxiety disorder 06/10/2012  ? Hammer toe of right foot 10/23/2014  ? Formatting of this note might be different from the original. Second and third tarsals Formatting of this note might be different from the original. Formatting of this note might be different from the original. Second and third tarsals  ? High cholesterol   ? Hypercholesterolemia 03/06/2013  ? Hypertension   ? Insomnia 06/10/2012  ? Lumbosacral spondylosis 06/10/2012  ? Lump of skin 10/23/2014  ? Formatting of this note might be different from the original. Right breast-manipulated by patient prior to clinical exam  ? Migraine syndrome 06/10/2012  ? Mixed hyperlipidemia 04/16/2020  ? Numbness of toes 10/23/2014  ? Osteoarthrosis, generalized, involving multiple sites 06/10/2012  ? Pain in soft tissues of limb 06/10/2012  ? Formatting of this note might be different from the original. 03/09/11 right arm.  ? Pain in toes of both feet 10/23/2014  ? Patellar tendinitis 06/10/2012  ? Persistent lymphocytosis 05/25/2014  ? Plantar fascial fibromatosis 06/10/2012  ? Risk for falls 03/05/2015  ? S/P CABG x 3 04/23/2020  ? Sensorineural hearing loss 06/10/2012  ? Shoulder impingement, right 11/27/2018  ? Subacute vaginitis 07/18/2015  ? Tendinopathy of rotator cuff, right 11/27/2018  ? Tinnitus 03/21/2013  ? Tobacco abuse  04/16/2020  ? Tobacco use disorder 03/06/2013  ? Type 2 diabetes, controlled, with neuropathy ([place]) 10/21/2014  ? Unspecified cataract 06/30/2015  ? Vertigo 06/14/2013  ? Vestibular dizziness 06/10/2012  ? ? ?Past Surgical History:  ?Procedure Laterality Date  ? ABDOMINAL HYSTERECTOMY    ? CESAREAN SECTION    ? CORONARY ARTERY BYPASS GRAFT N/A 04/23/2020  ? Procedure: CORONARY ARTERY BYPASS GRAFTING (CABG), ON PUMP, TIMES THREE, USING LEFT INTERNAL MAMMARY ARTERY AND ENDOSCOPICALLY HARVESTED RIGHT GREATER SAPHENOUS VEIN;  Surgeon: [name], MD;  Location: [facility];  Service: Open Heart Surgery;  Laterality: N/A;  ? LEFT HEART CATH AND CORONARY ANGIOGRAPHY N/A 04/22/2020  ? Procedure: LEFT HEART CATH AND CORONARY ANGIOGRAPHY;  Surgeon: Troy Sine, MD;  Location: [facility];  Service: Cardiovascular;  Laterality: N/A;  ? TEE WITHOUT CARDIOVERSION N/A 04/23/2020  ? Procedure: TRANSESOPHAGEAL ECHOCARDIOGRAM (TEE);  Surgeon: [name], MD;  Location: [facility];  Service: Open Heart Surgery;  Laterality: N/A;  ? ? ?Family History  ?Problem Relation Age of Onset  ? Stomach cancer Mother   ? Hypertension Maternal Grandmother   ? Diabetes Maternal Grandfather   ? ? ?  Social History  ? ?Socioeconomic History  ? Marital status: Single  ?  Spouse name: Not on file  ? Number of children: Not on file  ? Years of education: Not on file  ? Highest education level: Not on file  ?Occupational History  ? Not on file  ?Tobacco Use  ? Smoking status: Former  ?  Packs/day: 1.00  ?  Types: Cigarettes  ?  Quit date: 04/22/2020  ?  Years since quitting: 0.9  ? Smokeless tobacco: Never  ?Vaping Use  ? Vaping Use: Never used  ?Substance and Sexual Activity  ? Alcohol use: Yes  ?  Comment: occasional  ? Drug use: No  ?  Comment: did eat marijuana brownie last week  ? Sexual activity: Not on file  ?Other Topics Concern  ? Not on file  ?Social History Narrative  ? Not on file  ? ?Social Determinants of Health  ? ?Financial Resource  Strain: Not on file  ?Food Insecurity: Not on file  ?Transportation Needs: Not on file  ?Physical Activity: Not on file  ?Stress: Not on file  ?Social Connections: Not on file  ?Intimate Partner Violence:

## 2021-04-02 NOTE — Telephone Encounter (Signed)
Called and spoke with pt in regards to PR, pt stated she is not interested. She will need PR in [facility] b/c she does not drive and our location is to far.Adv pt [facility] has not open there PR program back open. Pt still not interested in PR here at [facility]. ?  ?Closed referral ?

## 2021-04-03 NOTE — Telephone Encounter (Signed)
Spoke with the PCCs. They are not sure if [facility] has restarted the pulm or cardiac program yet. I called [phone] but they are not open. Will call back on Monday to see if they are taking new patients.  ?

## 2021-04-03 NOTE — Telephone Encounter (Signed)
Okay to place order for rehab at [facility] ?

## 2021-04-08 NOTE — Telephone Encounter (Signed)
AT[facility], they are open M-Th 8:30 am-4:30 pm.  I left a VM requesting a return call to see if they are taking new patients.  Will await a return call. ?

## 2021-04-09 NOTE — Telephone Encounter (Signed)
Spoke with pulmonary rehab at [facility]. They are still not doing pulmonary rehab. I tried calling the pt and let her know and see if she wants to attend rehab at another location. There was no answer and no option to leave msg. Will call back.  ?

## 2021-04-09 NOTE — Telephone Encounter (Signed)
Spoke with the pt  ?She is aware that [facility] is not doing rehab at this time  ?She wants to try [facility]  ?Referral placed  ?Nothing further needed ?

## 2021-04-23 ENCOUNTER — Telehealth (HOSPITAL_COMMUNITY): Payer: Self-pay

## 2021-04-23 NOTE — Telephone Encounter (Signed)
Called and spoke with pt in regards to PR, pt stated [facility] is too far for her and she is unable to participate at this time. ?  ?Closed referral ?

## 2021-05-17 ENCOUNTER — Encounter (HOSPITAL_BASED_OUTPATIENT_CLINIC_OR_DEPARTMENT_OTHER): Payer: Medicare Other | Admitting: Pulmonary Disease

## 2021-06-08 ENCOUNTER — Other Ambulatory Visit: Payer: Self-pay | Admitting: Cardiology

## 2021-06-08 NOTE — Telephone Encounter (Signed)
RX sent

## 2021-06-19 ENCOUNTER — Ambulatory Visit (HOSPITAL_BASED_OUTPATIENT_CLINIC_OR_DEPARTMENT_OTHER): Payer: Medicare Other | Attending: Nurse Practitioner | Admitting: Pulmonary Disease

## 2021-06-19 DIAGNOSIS — G4733 Obstructive sleep apnea (adult) (pediatric): Secondary | ICD-10-CM | POA: Insufficient documentation

## 2021-06-19 DIAGNOSIS — R5382 Chronic fatigue, unspecified: Secondary | ICD-10-CM | POA: Diagnosis not present

## 2021-06-19 DIAGNOSIS — G4736 Sleep related hypoventilation in conditions classified elsewhere: Secondary | ICD-10-CM | POA: Diagnosis not present

## 2021-06-19 DIAGNOSIS — R0683 Snoring: Secondary | ICD-10-CM | POA: Insufficient documentation

## 2021-06-30 DIAGNOSIS — G4733 Obstructive sleep apnea (adult) (pediatric): Secondary | ICD-10-CM

## 2021-07-01 ENCOUNTER — Other Ambulatory Visit: Payer: Self-pay

## 2021-07-01 ENCOUNTER — Encounter (HOSPITAL_BASED_OUTPATIENT_CLINIC_OR_DEPARTMENT_OTHER): Payer: Self-pay | Admitting: Urology

## 2021-07-01 ENCOUNTER — Emergency Department (HOSPITAL_BASED_OUTPATIENT_CLINIC_OR_DEPARTMENT_OTHER): Payer: Medicare Other

## 2021-07-01 ENCOUNTER — Emergency Department (HOSPITAL_BASED_OUTPATIENT_CLINIC_OR_DEPARTMENT_OTHER)
Admission: EM | Admit: 2021-07-01 | Discharge: 2021-07-01 | Disposition: A | Discharge status: Home / Self Care | Payer: Medicare Other | Attending: Emergency Medicine | Admitting: Emergency Medicine

## 2021-07-01 DIAGNOSIS — Z79899 Other long term (current) drug therapy: Secondary | ICD-10-CM | POA: Insufficient documentation

## 2021-07-01 DIAGNOSIS — Z7982 Long term (current) use of aspirin: Secondary | ICD-10-CM | POA: Diagnosis not present

## 2021-07-01 DIAGNOSIS — R1084 Generalized abdominal pain: Secondary | ICD-10-CM | POA: Diagnosis present

## 2021-07-01 DIAGNOSIS — Z7902 Long term (current) use of antithrombotics/antiplatelets: Secondary | ICD-10-CM | POA: Diagnosis not present

## 2021-07-01 LAB — CBC WITH DIFFERENTIAL/PLATELET
Abs Immature Granulocytes: 0.02 10*3/uL (ref 0.00–0.07)
Basophils Absolute: 0 10*3/uL (ref 0.0–0.1)
Basophils Relative: 0 %
Eosinophils Absolute: 0.1 10*3/uL (ref 0.0–0.5)
Eosinophils Relative: 2 %
HCT: 38.4 % (ref 36.0–46.0)
Hemoglobin: 12.8 g/dL (ref 12.0–15.0)
Immature Granulocytes: 0 %
Lymphocytes Relative: 39 %
Lymphs Abs: 2.8 10*3/uL (ref 0.7–4.0)
MCH: 29.5 pg (ref 26.0–34.0)
MCHC: 33.3 g/dL (ref 30.0–36.0)
MCV: 88.5 fL (ref 80.0–100.0)
Monocytes Absolute: 0.6 10*3/uL (ref 0.1–1.0)
Monocytes Relative: 9 %
Neutro Abs: 3.6 10*3/uL (ref 1.7–7.7)
Neutrophils Relative %: 50 %
Platelets: 263 10*3/uL (ref 150–400)
RBC: 4.34 MIL/uL (ref 3.87–5.11)
RDW: 14.9 % (ref 11.5–15.5)
WBC: 7.2 10*3/uL (ref 4.0–10.5)
nRBC: 0 % (ref 0.0–0.2)

## 2021-07-01 LAB — URINALYSIS, ROUTINE W REFLEX MICROSCOPIC
Bilirubin Urine: NEGATIVE
Glucose, UA: NEGATIVE mg/dL
Ketones, ur: NEGATIVE mg/dL
Leukocytes,Ua: NEGATIVE
Nitrite: NEGATIVE
Protein, ur: NEGATIVE mg/dL
Specific Gravity, Urine: 1.02 (ref 1.005–1.030)
pH: 5.5 (ref 5.0–8.0)

## 2021-07-01 LAB — COMPREHENSIVE METABOLIC PANEL
ALT: 39 U/L (ref 0–44)
AST: 33 U/L (ref 15–41)
Albumin: 3.9 g/dL (ref 3.5–5.0)
Alkaline Phosphatase: 103 U/L (ref 38–126)
Anion gap: 6 (ref 5–15)
BUN: 13 mg/dL (ref 8–23)
CO2: 26 mmol/L (ref 22–32)
Calcium: 9.2 mg/dL (ref 8.9–10.3)
Chloride: 106 mmol/L (ref 98–111)
Creatinine, Ser: 1.01 mg/dL — ABNORMAL HIGH (ref 0.44–1.00)
GFR, Estimated: >60 mL/min (ref >60)
Glucose, Bld: 163 mg/dL — ABNORMAL HIGH (ref 70–99)
Potassium: 3.7 mmol/L (ref 3.5–5.1)
Sodium: 138 mmol/L (ref 135–145)
Total Bilirubin: 0.4 mg/dL (ref 0.3–1.2)
Total Protein: 6.8 g/dL (ref 6.5–8.1)

## 2021-07-01 LAB — URINALYSIS, MICROSCOPIC (REFLEX)

## 2021-07-01 LAB — LIPASE, BLOOD: Lipase: 61 U/L — ABNORMAL HIGH (ref 11–51)

## 2021-07-01 MED ORDER — IOHEXOL 300 MG/ML  SOLN
100.0000 mL | Freq: Once | INTRAMUSCULAR | Status: AC | PRN
  Administered 2021-07-01: 100 mL via INTRAVENOUS

## 2021-07-01 NOTE — ED Provider Notes (Signed)
[facility] EMERGENCY DEPARTMENT Provider Note   CSN: [id number] Arrival date & time: 07/01/21  0021     History  Chief Complaint  Patient presents with   Flank Pain    Sabrina Swanson is a 61 y.o. female.  Patient presents to the emergency department for evaluation of abdominal pain.  Patient experiencing pain in the right flank area as well as diffusely across the abdomen.  Patient feels that her abdomen is distended and she might be experiencing some constipation.  No associated fever, nausea, vomiting or diarrhea.       Home Medications Prior to Admission medications   Medication Sig Start Date End Date Taking? Authorizing Provider  albuterol (VENTOLIN HFA) 108 (90 Base) MCG/ACT inhaler Inhale 2 puffs into the lungs every 4 (four) hours as needed for shortness of breath. 07/19/16   [provider]  aspirin 81 MG EC tablet Take 81 mg by mouth daily.    [provider]  atorvastatin (LIPITOR) 80 MG tablet Take 0.5 tablets (40 mg total) by mouth daily. 08/27/20 08/27/21  Revankar, Reita Cliche, MD  carvedilol (COREG) 25 MG tablet Take 1 tablet (25 mg total) by mouth 2 (two) times daily with a meal. 05/01/20   Roddenberry, Arlis Porta, PA-C  clopidogrel (PLAVIX) 75 MG tablet TAKE 1 TABLET(75 MG) BY MOUTH DAILY 06/08/21   Revankar, Reita Cliche, MD  dapagliflozin propanediol (FARXIGA) 10 MG TABS tablet Take 1 tablet (10 mg total) by mouth daily before breakfast. 05/01/20   Roddenberry, Arlis Porta, PA-C  ergocalciferol (VITAMIN D2) 1.25 MG (50000 UT) capsule Take 50,000 Units by mouth every 7 (seven) days. 01/23/19   [provider]  fluticasone furoate-vilanterol (BREO ELLIPTA) 100-25 MCG/ACT AEPB Inhale 1 puff into the lungs daily. 03/19/21   Cobb, Karie Schwalbe, NP  Ipratropium-Albuterol (COMBIVENT) 20-100 MCG/ACT AERS respimat Inhale 1 puff into the lungs daily in the afternoon. 07/19/16   [provider]  levofloxacin (LEVAQUIN) 500 MG tablet Take 500 mg by  mouth daily. 05/06/20   [provider]  losartan (COZAAR) 25 MG tablet Take 25 mg by mouth 2 (two) times daily. 11/16/20   [provider]  metFORMIN (GLUCOPHAGE-XR) 500 MG 24 hr tablet Take 500 mg by mouth daily. 10/17/19   [provider]  NARCAN 4 MG/0.1ML LIQD nasal spray kit Place 4 mg into the nose once. 11/22/19   [provider]  nitroGLYCERIN (NITROSTAT) 0.4 MG SL tablet Place 0.4 mg under the tongue every 5 (five) minutes as needed for chest pain.    [provider]  Omega-3 Fatty Acids (FISH OIL) 1000 MG CAPS Take 1,000 mg by mouth daily.    [provider]  oxyCODONE-acetaminophen (PERCOCET) 10-325 MG tablet Take 1 tablet by mouth every 8 (eight) hours. 05/17/20   [provider]  vitamin B-12 (CYANOCOBALAMIN) 1000 MCG tablet Take 1,000 mcg by mouth daily.    [provider]      Allergies    Acetaminophen    Review of Systems   Review of Systems  Physical Exam Updated Vital Signs BP (!) 145/87   Pulse 97   Temp 98.2 F (36.8 C) (Oral)   Resp 16   Ht '5\' 4"'  (1.626 m)   Wt 81.6 kg   SpO2 100%   BMI 30.90 kg/m  Physical Exam Vitals and nursing note reviewed.  Constitutional:      General: She is not in acute distress.    Appearance: She is well-developed.  HENT:  Head: Normocephalic and atraumatic.     Mouth/Throat:     Mouth: Mucous membranes are moist.  Eyes:     General: Vision grossly intact. Gaze aligned appropriately.     Extraocular Movements: Extraocular movements intact.     Conjunctiva/sclera: Conjunctivae normal.  Cardiovascular:     Rate and Rhythm: Normal rate and regular rhythm.     Pulses: Normal pulses.     Heart sounds: Normal heart sounds, S1 normal and S2 normal. No murmur heard.    No friction rub. No gallop.  Pulmonary:     Effort: Pulmonary effort is normal. No respiratory distress.     Breath sounds: Normal breath sounds.  Abdominal:     General: Bowel sounds are  normal.     Palpations: Abdomen is soft.     Tenderness: There is generalized abdominal tenderness. There is no guarding or rebound.     Hernia: No hernia is present.  Musculoskeletal:        General: No swelling.     Cervical back: Full passive range of motion without pain, normal range of motion and neck supple. No spinous process tenderness or muscular tenderness. Normal range of motion.     Right lower leg: No edema.     Left lower leg: No edema.  Skin:    General: Skin is warm and dry.     Capillary Refill: Capillary refill takes less than 2 seconds.     Findings: No ecchymosis, erythema, rash or wound.  Neurological:     General: No focal deficit present.     Mental Status: She is alert and oriented to person, place, and time.     GCS: GCS eye subscore is 4. GCS verbal subscore is 5. GCS motor subscore is 6.     Cranial Nerves: Cranial nerves 2-12 are intact.     Sensory: Sensation is intact.     Motor: Motor function is intact.     Coordination: Coordination is intact.  Psychiatric:        Attention and Perception: Attention normal.        Mood and Affect: Mood normal.        Speech: Speech normal.        Behavior: Behavior normal.     ED Results / Procedures / Treatments   Labs (all labs ordered are listed, but only abnormal results are displayed) Labs Reviewed  COMPREHENSIVE METABOLIC PANEL - Abnormal; Notable for the following components:      Result Value   Glucose, Bld 163 (*)    Creatinine, Ser 1.01 (*)    All other components within normal limits  LIPASE, BLOOD - Abnormal; Notable for the following components:   Lipase 61 (*)    All other components within normal limits  URINALYSIS, ROUTINE W REFLEX MICROSCOPIC - Abnormal; Notable for the following components:   Hgb urine dipstick TRACE (*)    All other components within normal limits  URINALYSIS, MICROSCOPIC (REFLEX) - Abnormal; Notable for the following components:   Bacteria, UA RARE (*)    All other  components within normal limits  CBC WITH DIFFERENTIAL/PLATELET    EKG None  Radiology CT ABDOMEN PELVIS W CONTRAST  Result Date: 07/01/2021 CLINICAL DATA:  Abdominal pain, acute, nonlocalized. Right flank pain EXAM: CT ABDOMEN AND PELVIS WITH CONTRAST TECHNIQUE: Multidetector CT imaging of the abdomen and pelvis was performed using the standard protocol following bolus administration of intravenous contrast. RADIATION DOSE REDUCTION: This exam was performed according to the  departmental dose-optimization program which includes automated exposure control, adjustment of the mA and/or kV according to patient size and/or use of iterative reconstruction technique. CONTRAST:  150m OMNIPAQUE IOHEXOL 300 MG/ML  SOLN COMPARISON:  None Available. FINDINGS: Lower chest: Airspace opacities within the visualized lung bases within the subpleural regions may represent areas of parenchymal scarring. Elevation of the left hemidiaphragm noted. Median sternotomy has been performed. Cardiac size within normal limits. Hepatobiliary: Mild hepatic steatosis. No enhancing intrahepatic mass identified. No intra or extrahepatic biliary ductal dilation. Gallbladder unremarkable. Pancreas: Unremarkable Spleen: Unremarkable Adrenals/Urinary Tract: Adrenal glands are unremarkable. Kidneys are normal, without renal calculi, focal lesion, or hydronephrosis. Bladder is unremarkable. Stomach/Bowel: Stomach is within normal limits. Appendix appears normal. No evidence of bowel wall thickening, distention, or inflammatory changes. Vascular/Lymphatic: Aortic atherosclerosis. No enlarged abdominal or pelvic lymph nodes. Reproductive: Status post hysterectomy. No adnexal masses. Other: Tiny fat containing umbilical hernia. No abdominopelvic ascites. Musculoskeletal: No acute bone abnormality. No lytic or blastic bone lesion. IMPRESSION: 1. No acute intra-abdominal pathology identified. No definite radiographic explanation for the patient's  reported symptoms. 2. Mild hepatic steatosis. Aortic Atherosclerosis (ICD10-I70.0). Electronically Signed   By: AFidela SalisburyM.D.   On: 07/01/2021 03:00   SLEEP STUDY DOCUMENTS  Result Date: 06/30/2021 Ordered by an unspecified provider.   Procedures Procedures    Medications Ordered in ED Medications  iohexol (OMNIPAQUE) 300 MG/ML solution 100 mL (100 mLs Intravenous Contrast Given 07/01/21 0240)    ED Course/ Medical Decision Making/ A&P                           Medical Decision Making Amount and/or Complexity of Data Reviewed Labs: ordered. Radiology: ordered.  Risk Prescription drug management.   Presents to the emergency department for evaluation of abdominal pain.  Patient with right flank and abdominal pain ongoing for a couple of days.  Patient indicates that the pain is in the right flank area but then becomes more diffuse across the abdomen.  Differential diagnosis considered includes cholecystitis, colitis, diverticulitis, small bowel obstruction, appendicitis, UTI, pyelonephritis.  Patient's abdominal exam is nonspecific.  She has some mild but diffuse tenderness without any focal tenderness.  No signs of peritonitis.  Lab work unremarkable.  Patient underwent CT scan to further evaluate.  CT scan shows normal appendix and no acute process. On re-evaluation patient is resting comfortably.  She reports minimal pain at this time.  No serious cause of her pain has been identified and it is felt that she is appropriate for discharge, follow-up with primary care, given return precautions.        Final Clinical Impression(s) / ED Diagnoses Final diagnoses:  Generalized abdominal pain    Rx / DC Orders ED Discharge Orders     None         Verl Kitson, CGwenyth Allegra MD 07/01/21 [phone]

## 2021-07-01 NOTE — Progress Notes (Signed)
Spoke with pt and notified of results per [facility]. Pt verbalized understanding and denied any questions. I scheduled her for 07/16/21.

## 2021-07-01 NOTE — ED Triage Notes (Signed)
Pt states right sided flank pain radiating to abdomen that started today, reports "knot in abdomen" earlier that has resolved  Also concern for yeast infections Reports vaginal discharge white/clear

## 2021-07-01 NOTE — ED Notes (Signed)
Assumed patient care at this time. Urine collected and sent to lab. Vitals updated. Patient states pain is 7/10 and states is tolerable at this time.

## 2021-07-01 NOTE — Progress Notes (Signed)
Please notify patient that her sleep study showed that she has moderate obstructive sleep apnea. Please schedule follow up to discuss treatment options. Thanks.

## 2021-07-16 ENCOUNTER — Encounter: Payer: Self-pay | Admitting: Nurse Practitioner

## 2021-07-16 ENCOUNTER — Ambulatory Visit (INDEPENDENT_AMBULATORY_CARE_PROVIDER_SITE_OTHER): Payer: Medicare Other | Admitting: Nurse Practitioner

## 2021-07-16 VITALS — BP 138/84 | HR 79 | Temp 98.0°F | Ht 64.0 in | Wt 186.8 lb

## 2021-07-16 DIAGNOSIS — G4733 Obstructive sleep apnea (adult) (pediatric): Secondary | ICD-10-CM

## 2021-07-16 DIAGNOSIS — J449 Chronic obstructive pulmonary disease, unspecified: Secondary | ICD-10-CM | POA: Diagnosis not present

## 2021-07-16 HISTORY — DX: Obstructive sleep apnea (adult) (pediatric): G47.33

## 2021-07-16 MED ORDER — TRELEGY ELLIPTA 100-62.5-25 MCG/ACT IN AEPB: 1.0000 | INHALATION_SPRAY | Freq: Every day | RESPIRATORY_TRACT | 0 refills | Status: AC

## 2021-07-16 MED ORDER — TRELEGY ELLIPTA 100-62.5-25 MCG/ACT IN AEPB: 100.0000 ug | INHALATION_SPRAY | Freq: Every day | RESPIRATORY_TRACT | 0 refills | Status: DC

## 2021-07-16 NOTE — Assessment & Plan Note (Signed)
Some improvement with Breo. She is still experiencing daily symptoms of dyspnea. Likely multifactorial related to COPD/asthma and deconditioning. We will trial step up to triple therapy and assess her response. She was previously referred to pulmonary rehab but declined to go given distance from her house. She is going to start going back to the gym and doing some home exercises. Discussed graded exercises and monitoring response. Continue PRN albuterol.   Patient Instructions  -Stop Breo. Trial of Trelegy 1 puffs daily. Brush tongue and rinse mouth afterwards. Please notify if you see improvement in your breathing with this so we can send a prescription  -Continue Albuterol inhaler 2 puffs every 4 hours as needed for shortness of breath or wheezing. Notify if symptoms persist despite rescue inhaler/neb use. -Continue supplemental oxygen therapy at 2 lpm with activity and at night. Goal oxygen level >88-90%   Start to use CPAP auto 5-15 cmH2O every night, minimum of 4-6 hours a night.  Change equipment every 30 days or as directed by DME. Wash your tubing with warm soap and water daily, hang to dry. Wash humidifier portion weekly.  Be aware of reduced alertness and do not drive or operate heavy machinery if experiencing this or drowsiness.  Exercise encouraged, as tolerated. Avoid or decrease alcohol consumption and medications that make you more sleepy, if possible. Notify if persistent daytime sleepiness occurs even with consistent use of CPAP.   Follow up in 12 weeks with Dr. Ander Slade or Alanson Aly. If symptoms do not improve or worsen, please contact office for sooner follow up or seek emergency care.

## 2021-07-16 NOTE — Assessment & Plan Note (Signed)
Recent sleep study with AHI 28.8/h and associated nocturnal hypoxemia. We will start her on auto CPAP 5-15 cmH2O, mask of choice and heated humidification. Orders sent to DME today. We discussed how untreated sleep apnea puts an individual at risk for cardiac arrhthymias, pulm HTN, DM, stroke and increases their risk for daytime accidents. We also briefly reviewed treatment options including weight loss, CPAP therapy or referral to ENT for possible surgical options

## 2021-07-16 NOTE — Patient Instructions (Addendum)
-  Stop Breo. Trial of Trelegy 1 puffs daily. Brush tongue and rinse mouth afterwards. Please notify if you see improvement in your breathing with this so we can send a prescription  -Continue Albuterol inhaler 2 puffs every 4 hours as needed for shortness of breath or wheezing. Notify if symptoms persist despite rescue inhaler/neb use. -Continue supplemental oxygen therapy at 2 lpm with activity and at night. Goal oxygen level >88-90%   Start to use CPAP auto 5-15 cmH2O every night, minimum of 4-6 hours a night.  Change equipment every 30 days or as directed by DME. Wash your tubing with warm soap and water daily, hang to dry. Wash humidifier portion weekly.  Be aware of reduced alertness and do not drive or operate heavy machinery if experiencing this or drowsiness.  Exercise encouraged, as tolerated. Avoid or decrease alcohol consumption and medications that make you more sleepy, if possible. Notify if persistent daytime sleepiness occurs even with consistent use of CPAP.   Follow up in 12 weeks with Dr. Ander Slade or Alanson Aly. If symptoms do not improve or worsen, please contact office for sooner follow up or seek emergency care.

## 2021-07-16 NOTE — Addendum Note (Signed)
Addended by: Irine Seal B on: 07/16/2021 10:13 AM   Modules accepted: Orders

## 2021-07-16 NOTE — Assessment & Plan Note (Signed)
O2 stable on room air in office today with sat 99%. Advised to monitor levels at home for goal >88-90% and use oxygen with activity. Educated on importance of compliance. She will need to continue supplemental O2 2 lpm at night until she receives CPAP. We will order ONO on CPAP after to determine if she will need O2 bled through.

## 2021-07-16 NOTE — Progress Notes (Signed)
_0  ID: Sabrina Swanson, female    DOB: [birth year], 61 y.o.   MRN: [id number]  Chief Complaint  Patient presents with   Follow-up    Pt wants to discuss split night. Breathing same as LOV.    Referring provider: Wynelle Fanny, DO  HPI: 61 year old female, former smoker followed for moderate COPD/asthma and chronic respiratory failure. She is a patient of Dr. Judson Roch and last seen in office on 11/18/2020. Past medical history significant for CAD s/p CABG, HTN, migraines, DM, DDD, HLD, anxiety, depression, insomnia, HLD.   TEST/EVENTS:  08/26/2020 CXR 2 view: persistent elevation of left hemidiaphragm, blunting of left costophrenic angle. Improved overall aeration with mild persistent left basilar atelectasis/scarring 02/18/2021 PFTs: FVC 1.64 (61), FEV1 1.13 (54), ratio 69, TLC 71%, DLCOcor 78%. Positive BD (14%). Moderate obstructive airway disease with reversibility and mild restriction with minimal diffusion defect.  06/19/2021 PSG: AHI 28.8/h, SpO2 low 76%  11/18/2020: OV with Dr. Ander Slade.  Referred in October for evaluation of shortness of breath after CABG.  There was concern for possible pneumonia postsurgery.  During this OV, she reported that her breathing felt about the same as it did previously.  Only using Combivent and albuterol as needed.  Does have some chest discomfort with activity.  PFTs were pending.  Did have mention of COPD in her history however reported that her shortness of breath did not start till after her cardiac surgery.  Noted to have some diaphragmatic dysfunction on her chest x-ray.  Advised graded exercises and taught proper inhaler technique.  03/19/2021: OV with [name] NP for follow-up after PFTs and to requalify for oxygen therapy.  She was previously started on this by cardiology after her surgery.  Her pulmonary function testing showed moderate obstruction with a 14% bronchodilator response and mild restriction.  She usually wears her oxygen as needed at home  when she feels short of breath.  Does not routinely check her oxygen levels. She was started on this by cardiology; qualified for POC 2 lpm at this visit. Her activity tolerance is relatively unchanged compared to when she was seen last.  She continues to have shortness of breath with long distances and feels as though she gets winded more easily than she used to.  She does notice some wheezing as well.  She denies any cough, orthopnea, PND or lower extremity swelling.  She uses her albuterol and Combivent every other day to a few times a week. Doesn't notice a significant difference with Combivent versus albuterol. Stepped up to trial of Breo. Referral to pulmonary rehab. Does reports some snoring at night and daytime fatigue symptoms as well. She denies drowsy driving, morning heada  04/02/2021: OV with Dr. Ander Slade. Not using her oxygen consistently. Encouraged to use oxygen with activity and use pulse ox to monitor. She is not exercising on a regular basis but has been discussing with her physical therapy. Inhaler technique suboptimal; re-educated at [place]. Benefits from [facility] - continued at [place]. Encouraged graded exercises.   07/16/2021: Today - follow up Patient presents today for follow up after undergoing split night study which showed moderate to severe OSA and nocturnal hypoxemia, which was corrected with 2 lpm. She was never started on CPAP during study. She continues to have daytime fatigue symptoms and says that she snores very loudly. She denies any drowsy driving or morning headaches. She is concerned about CPAP mask and feeling claustrophobic with it. Wears her oxygen inconsistently; feels like she doesn't always need it.  Her breathing is unchanged. She still has some shortness of breath with exertion. She did feel benefit from the [facility] before but feels like her improvement has plateaued. Denies any cough, wheezing, weight loss, hemoptysis, anorexia, leg swelling, palpitations. She uses her rescue  inhaler once a day, about 2-3 hours after she uses her Breo.   Allergies  Allergen Reactions   Acetaminophen Itching and Nausea Only    Immunization History  Administered Date(s) Administered   Influenza Split 12/20/2011   Influenza, Seasonal, Injecte, Preservative Fre 04/03/2012   Influenza-Unspecified 12/20/2011, 04/03/2012, 10/18/2020   Moderna Sars-Covid-2 Vaccination 04/17/2019, 05/15/2019, 01/02/2020   PPD Test 05/03/2016   Pneumococcal Polysaccharide-23 12/20/2011, 04/03/2012   Pneumococcal-Unspecified 05/19/2015   Tetanus 05/19/2015    Past Medical History:  Diagnosis Date   Abnormal stress test 04/16/2020   Acquired trigger finger 06/10/2012   Alopecia 05/16/2013   Angina pectoris ([place]) 11/13/2019   Anxiety state 06/10/2012   Arthritis of right acromioclavicular joint 11/27/2018   Back pain    Benign neoplasm of colon 06/10/2012   Bursitis disorder 05/11/2011   CAD (coronary artery disease), native coronary artery 04/22/2020   Cardiac murmur 11/13/2019   Carpal tunnel syndrome 06/10/2012   Formatting of this note might be different from the original. bilateral   Chalazion right upper eyelid 06/30/2015   Continuous dependence on cigarette smoking 11/13/2019   COPD (chronic obstructive pulmonary disease) ([place]) 05/16/2013   DDD (degenerative disc disease), lumbosacral 06/10/2012   Depressive disorder 06/10/2012   Diabetes mellitus due to underlying condition with hyperosmolarity without coma, without long-term current use of insulin ([place]) 04/16/2020   Diabetes mellitus without complication ([place])    Dyspareunia 03/04/2014   Dysthymia 06/10/2012   Encounter for long-term (current) use of other medications 06/10/2012   Essential hypertension 08/14/2019   Former smoker 05/21/2020   Generalized anxiety disorder 06/10/2012   Hammer toe of right foot 10/23/2014   Formatting of this note might be different from the original. Second and third tarsals Formatting of this note might be different from the  original. Formatting of this note might be different from the original. Second and third tarsals   High cholesterol    Hypercholesterolemia 03/06/2013   Hypertension    Insomnia 06/10/2012   Lumbosacral spondylosis 06/10/2012   Lump of skin 10/23/2014   Formatting of this note might be different from the original. Right breast-manipulated by patient prior to clinical exam   Migraine syndrome 06/10/2012   Mixed hyperlipidemia 04/16/2020   Numbness of toes 10/23/2014   Osteoarthrosis, generalized, involving multiple sites 06/10/2012   Pain in soft tissues of limb 06/10/2012   Formatting of this note might be different from the original. 03/09/11 right arm.   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   Sensorineural hearing loss 06/10/2012   Shoulder impingement, right 11/27/2018   Subacute vaginitis 07/18/2015   Tendinopathy of rotator cuff, right 11/27/2018   Tinnitus 03/21/2013   Tobacco abuse 04/16/2020   Tobacco use disorder 03/06/2013   Type 2 diabetes, controlled, with neuropathy ([place]) 10/21/2014   Unspecified cataract 06/30/2015   Vertigo 06/14/2013   Vestibular dizziness 06/10/2012    Tobacco History: Social History   Tobacco Use  Smoking Status Former   Packs/day: 1.00   Types: Cigarettes   Quit date: 04/22/2020   Years since quitting: 1.2  Passive exposure: Past  Smokeless Tobacco Never   Counseling given: Not Answered   Outpatient Medications Prior to Visit  Medication Sig Dispense Refill   albuterol (VENTOLIN HFA) 108 (90 Base) MCG/ACT inhaler Inhale 2 puffs into the lungs every 4 (four) hours as needed for shortness of breath.     aspirin 81 MG EC tablet Take 81 mg by mouth daily.     atorvastatin (LIPITOR) 80 MG tablet Take 0.5 tablets (40 mg total) by mouth daily. 30 tablet 11   carvedilol (COREG) 25 MG tablet Take 1 tablet  (25 mg total) by mouth 2 (two) times daily with a meal. 60 tablet 2   clopidogrel (PLAVIX) 75 MG tablet TAKE 1 TABLET(75 MG) BY MOUTH DAILY 30 tablet 9   dapagliflozin propanediol (FARXIGA) 10 MG TABS tablet Take 1 tablet (10 mg total) by mouth daily before breakfast. 30 tablet 2   ergocalciferol (VITAMIN D2) 1.25 MG (50000 UT) capsule Take 50,000 Units by mouth every 7 (seven) days.     fluticasone furoate-vilanterol (BREO ELLIPTA) 100-25 MCG/ACT AEPB Inhale 1 puff into the lungs daily. 30 each 5   Ipratropium-Albuterol (COMBIVENT) 20-100 MCG/ACT AERS respimat Inhale 1 puff into the lungs daily in the afternoon.     levofloxacin (LEVAQUIN) 500 MG tablet Take 500 mg by mouth daily.     losartan (COZAAR) 25 MG tablet Take 25 mg by mouth 2 (two) times daily.     metFORMIN (GLUCOPHAGE-XR) 500 MG 24 hr tablet Take 500 mg by mouth daily.     NARCAN 4 MG/0.1ML LIQD nasal spray kit Place 4 mg into the nose once.     nitroGLYCERIN (NITROSTAT) 0.4 MG SL tablet Place 0.4 mg under the tongue every 5 (five) minutes as needed for chest pain.     Omega-3 Fatty Acids (FISH OIL) 1000 MG CAPS Take 1,000 mg by mouth daily.     oxyCODONE-acetaminophen (PERCOCET) 10-325 MG tablet Take 1 tablet by mouth every 8 (eight) hours.     vitamin B-12 (CYANOCOBALAMIN) 1000 MCG tablet Take 1,000 mcg by mouth daily.     No facility-administered medications prior to visit.     Review of Systems:   Constitutional: No weight loss or gain, night sweats, fevers, chills. +daytime fatigue HEENT: No headaches, difficulty swallowing, tooth/dental problems, or sore throat. No ear ache, nasal congestion, or post nasal drip.  CV:  No chest pain, orthopnea, PND, swelling in lower extremities, anasarca, dizziness, palpitations, syncope Resp: +shortness of breath with exertion (unchanged); snoring. No excess mucus or change in color of mucus. No productive or non-productive. No hemoptysis. No chest wall deformity GI:  No heartburn,  indigestion, abdominal pain, nausea, vomiting, diarrhea, change in bowel habits, loss of appetite, bloody stools.  GU: No dysuria, change in color of urine, urgency or frequency.  No flank pain, no hematuria  Skin: No rash, lesions, ulcerations MSK:  No joint pain or swelling.  No decreased range of motion.  No back pain. Neuro: No dizziness or lightheadedness.  Psych: No depression or anxiety. Mood stable.     Physical Exam:  BP 138/84 (BP Location: Right Arm, Patient Position: Sitting, Cuff Size: Normal)   Pulse 79   Temp 98 F (36.7 C) (Oral)   Ht _0  (1.626 m)   Wt 186 lb 12.8 oz (84.7 kg)   SpO2 99%   BMI 32.06 kg/m   GEN: Pleasant, interactive, well-appearing; in no acute distress HEENT:  Normocephalic and atraumatic. PERRLA. Sclera white. Nasal turbinates pink,  moist and patent bilaterally. No rhinorrhea present. Oropharynx pink and moist, without exudate or edema. No lesions, ulcerations, or postnasal drip.  NECK:  Supple w/ fair ROM. No JVD present. Normal carotid impulses w/o bruits. Thyroid symmetrical with no goiter or nodules palpated. No lymphadenopathy.   CV: RRR, no m/r/g, no peripheral edema. Pulses intact, +2 bilaterally. No cyanosis, pallor or clubbing. PULMONARY:  Unlabored, regular breathing. Clear bilaterally A&P w/o wheezes/rales/rhonchi. No accessory muscle use. No dullness to percussion. GI: BS present and normoactive. Soft, non-tender to palpation. No organomegaly or masses detected. No CVA tenderness. MSK: No erythema, warmth or tenderness. Cap refil <2 sec all extrem. No deformities or joint swelling noted.  Neuro: A/Ox3. No focal deficits noted.   Skin: Warm, no lesions or rashe Psych: Normal affect and behavior. Judgement and thought content appropriate.     Lab Results:  CBC    Component Value Date/Time   WBC 7.2 07/01/2021 0122   RBC 4.34 07/01/2021 0122   HGB 12.8 07/01/2021 0122   HGB 14.9 08/26/2020 0908   HCT 38.4 07/01/2021 0122   HCT  44.2 08/26/2020 0908   PLT 263 07/01/2021 0122   PLT 256 08/26/2020 0908   MCV 88.5 07/01/2021 0122   MCV 85 08/26/2020 0908   MCH 29.5 07/01/2021 0122   MCHC 33.3 07/01/2021 0122   RDW 14.9 07/01/2021 0122   RDW 14.1 08/26/2020 0908   LYMPHSABS 2.8 07/01/2021 0122   LYMPHSABS 2.3 08/26/2020 0908   MONOABS 0.6 07/01/2021 0122   EOSABS 0.1 07/01/2021 0122   EOSABS 0.1 08/26/2020 0908   BASOSABS 0.0 07/01/2021 0122   BASOSABS 0.0 08/26/2020 0908    BMET    Component Value Date/Time   NA 138 07/01/2021 0122   NA 141 02/20/2021 0903   K 3.7 07/01/2021 0122   CL 106 07/01/2021 0122   CO2 26 07/01/2021 0122   GLUCOSE 163 (H) 07/01/2021 0122   BUN 13 07/01/2021 0122   BUN 10 02/20/2021 0903   CREATININE 1.01 (H) 07/01/2021 0122   CALCIUM 9.2 07/01/2021 0122   GFRNONAA >60 07/01/2021 0122   GFRAA >90 05/10/2013 1700    BNP No results found for: "BNP"   Imaging:  CT ABDOMEN PELVIS W CONTRAST  Result Date: 07/01/2021 CLINICAL DATA:  Abdominal pain, acute, nonlocalized. Right flank pain EXAM: CT ABDOMEN AND PELVIS WITH CONTRAST TECHNIQUE: Multidetector CT imaging of the abdomen and pelvis was performed using the standard protocol following bolus administration of intravenous contrast. RADIATION DOSE REDUCTION: This exam was performed according to the departmental dose-optimization program which includes automated exposure control, adjustment of the mA and/or kV according to patient size and/or use of iterative reconstruction technique. CONTRAST:  194m OMNIPAQUE IOHEXOL 300 MG/ML  SOLN COMPARISON:  None Available. FINDINGS: Lower chest: Airspace opacities within the visualized lung bases within the subpleural regions may represent areas of parenchymal scarring. Elevation of the left hemidiaphragm noted. Median sternotomy has been performed. Cardiac size within normal limits. Hepatobiliary: Mild hepatic steatosis. No enhancing intrahepatic mass identified. No intra or extrahepatic  biliary ductal dilation. Gallbladder unremarkable. Pancreas: Unremarkable Spleen: Unremarkable Adrenals/Urinary Tract: Adrenal glands are unremarkable. Kidneys are normal, without renal calculi, focal lesion, or hydronephrosis. Bladder is unremarkable. Stomach/Bowel: Stomach is within normal limits. Appendix appears normal. No evidence of bowel wall thickening, distention, or inflammatory changes. Vascular/Lymphatic: Aortic atherosclerosis. No enlarged abdominal or pelvic lymph nodes. Reproductive: Status post hysterectomy. No adnexal masses. Other: Tiny fat containing umbilical hernia. No abdominopelvic ascites. Musculoskeletal: No acute  bone abnormality. No lytic or blastic bone lesion. IMPRESSION: 1. No acute intra-abdominal pathology identified. No definite radiographic explanation for the patient's reported symptoms. 2. Mild hepatic steatosis. Aortic Atherosclerosis (ICD10-I70.0). Electronically Signed   By: Fidela Salisbury M.D.   On: 07/01/2021 03:00   SLEEP STUDY DOCUMENTS  Result Date: 06/30/2021 Ordered by an unspecified provider.  Split night study  Result Date: 06/19/2021 Rigoberto Noel, MD     06/30/2021  3:57 PM Patient Name: Shaquaya, Wuellner Date: 06/19/2021 Gender: Female D.O.B: [birth year] Age (years): 40 Referring Provider: Clayton Bibles NP Height (inches): 64 Interpreting Physician: Kara Mead MD, ABSM Weight (lbs): 170 RPSGT: Baxter Flattery BMI: 29 MRN: [id number] Neck Size: 16.00 <br> <br> CLINICAL INFORMATION Sleep Study Type: NPSG Indication for sleep study: Snoring, Witnesses Apnea / Gasping During Sleep , COPD Epworth Sleepiness Score: 4 SLEEP STUDY TECHNIQUE As per the AASM Manual for the Scoring of Sleep and Associated Events v2.3 (April 2016) with a hypopnea requiring 4% desaturations. The channels recorded and monitored were frontal, central and occipital EEG, electrooculogram (EOG), submentalis EMG (chin), nasal and oral airflow, thoracic and abdominal wall motion, anterior  tibialis EMG, snore microphone, electrocardiogram, and pulse oximetry. MEDICATIONS Medications self-administered by patient taken the night of the study : N/A SLEEP ARCHITECTURE The study was initiated at 10:24:00 PM and ended at 4:42:57 AM. Sleep onset time was 44.8 minutes and the sleep efficiency was 83.0%%. The total sleep time was 314.6 minutes. Stage REM latency was 82.5 minutes. The patient spent 2.4%% of the night in stage N1 sleep, 80.0%% in stage N2 sleep, 0.0%% in stage N3 and 17.6% in REM. Alpha intrusion was absent. Supine sleep was 28.07%. RESPIRATORY PARAMETERS The overall apnea/hypopnea index (AHI) was 28.8 per hour. There were 73 total apneas, including 73 obstructive, 0 central and 0 mixed apneas. There were 78 hypopneas and 0 RERAs. The AHI during Stage REM sleep was 38.9 per hour. AHI while supine was 10.9 per hour. The mean oxygen saturation was 94.2%. The minimum SpO2 during sleep was 76.0%. moderate snoring was noted during this study. CARDIAC DATA The 2 lead EKG demonstrated sinus rhythm. The mean heart rate was 83.1 beats per minute. Other EKG findings include: None. LEG MOVEMENT DATA The total PLMS were 0 with a resulting PLMS index of 0.0. Associated arousal with leg movement index was 0.0 . IMPRESSIONS - Moderate obstructive sleep apnea occurred during this study (AHI = 28.8/h). - Moderate oxygen desaturation was noted during this study (Min O2 = 76.0%).2 L O2 was applied at 1:31 am - The patient snored with moderate snoring volume. - No cardiac abnormalities were noted during this study. - Clinically significant periodic limb movements did not occur during sleep. No significant associated arousals. DIAGNOSIS - Obstructive Sleep Apnea (G47.33) - Nocturnal Hypoxemia (G47.36) RECOMMENDATIONS - Therapeutic CPAP titration to determine optimal pressure required to alleviate sleep disordered breathing. Oxygen needs can also be clarified during the titration study - Avoid alcohol, sedatives and  other CNS depressants that may worsen sleep apnea and disrupt normal sleep architecture. - Sleep hygiene should be reviewed to assess factors that may improve sleep quality. - Weight management and regular exercise should be initiated or continued if appropriate. Kara Mead MD Board Certified in [employer] 02/18/2021    8:36 AM  PFT Results  FVC-Pre L 1.47   FVC-Predicted Pre % 55   FVC-Post L 1.64   FVC-Predicted  Post % 61   Pre FEV1/FVC % % 68   Post FEV1/FCV % % 69   FEV1-Pre L 0.99   FEV1-Predicted Pre % 47   FEV1-Post L 1.13   DLCO uncorrected ml/min/mmHg 15.91   DLCO UNC% % 78   DLCO corrected ml/min/mmHg 15.91   DLCO COR %Predicted % 78   DLVA Predicted % 129   TLC L 3.60   TLC % Predicted % 71   RV % Predicted % 99     No results found for: "NITRICOXIDE"   03/19/2021: Walking oximetry with desaturation to 84% on room air; recovered to 90's with 2 lpm    Assessment & Plan:   COPD with asthma ([place]) Some improvement with Breo. She is still experiencing daily symptoms of dyspnea. Likely multifactorial related to COPD/asthma and deconditioning. We will trial step up to triple therapy and assess her response. She was previously referred to pulmonary rehab but declined to go given distance from her house. She is going to start going back to the gym and doing some home exercises. Discussed graded exercises and monitoring response. Continue PRN albuterol.   Patient Instructions  -Stop Breo. Trial of Trelegy 1 puffs daily. Brush tongue and rinse mouth afterwards. Please notify if you see improvement in your breathing with this so we can send a prescription  -Continue Albuterol inhaler 2 puffs every 4 hours as needed for shortness of breath or wheezing. Notify if symptoms persist despite rescue inhaler/neb use. -Continue supplemental oxygen therapy at 2 lpm with activity and at night. Goal oxygen level >88-90%   Start to use CPAP auto 5-15  cmH2O every night, minimum of 4-6 hours a night.  Change equipment every 30 days or as directed by DME. Wash your tubing with warm soap and water daily, hang to dry. Wash humidifier portion weekly.  Be aware of reduced alertness and do not drive or operate heavy machinery if experiencing this or drowsiness.  Exercise encouraged, as tolerated. Avoid or decrease alcohol consumption and medications that make you more sleepy, if possible. Notify if persistent daytime sleepiness occurs even with consistent use of CPAP.   Follow up in 12 weeks with Dr. Ander Slade or Alanson Aly. If symptoms do not improve or worsen, please contact office for sooner follow up or seek emergency care.    Moderate obstructive sleep apnea Recent sleep study with AHI 28.8/h and associated nocturnal hypoxemia. We will start her on auto CPAP 5-15 cmH2O, mask of choice and heated humidification. Orders sent to DME today. We discussed how untreated sleep apnea puts an individual at risk for cardiac arrhthymias, pulm HTN, DM, stroke and increases their risk for daytime accidents. We also briefly reviewed treatment options including weight loss, CPAP therapy or referral to ENT for possible surgical options  Chronic respiratory failure with hypoxia ([place]) O2 stable on room air in office today with sat 99%. Advised to monitor levels at home for goal >88-90% and use oxygen with activity. Educated on importance of compliance. She will need to continue supplemental O2 2 lpm at night until she receives CPAP. We will order ONO on CPAP after to determine if she will need O2 bled through.   I spent 35 minutes of dedicated to the care of this patient on the date of this encounter to include pre-visit review of records, face-to-face time with the patient discussing conditions above, post visit ordering of testing, clinical documentation with the electronic health record, making appropriate referrals as documented, and communicating necessary  findings to members of the patients care team.  Clayton Bibles, NP 07/16/2021  Pt aware and understands NP's role.

## 2021-08-12 ENCOUNTER — Other Ambulatory Visit: Payer: Self-pay

## 2021-08-12 ENCOUNTER — Encounter (HOSPITAL_BASED_OUTPATIENT_CLINIC_OR_DEPARTMENT_OTHER): Payer: Self-pay | Admitting: Emergency Medicine

## 2021-08-12 ENCOUNTER — Emergency Department (HOSPITAL_BASED_OUTPATIENT_CLINIC_OR_DEPARTMENT_OTHER): Payer: Medicare Other

## 2021-08-12 ENCOUNTER — Emergency Department (HOSPITAL_BASED_OUTPATIENT_CLINIC_OR_DEPARTMENT_OTHER)
Admission: EM | Admit: 2021-08-12 | Discharge: 2021-08-12 | Disposition: A | Discharge status: Home / Self Care | Payer: Medicare Other | Attending: Emergency Medicine | Admitting: Emergency Medicine

## 2021-08-12 ENCOUNTER — Telehealth: Payer: Self-pay | Admitting: Pulmonary Disease

## 2021-08-12 DIAGNOSIS — I10 Essential (primary) hypertension: Secondary | ICD-10-CM | POA: Insufficient documentation

## 2021-08-12 DIAGNOSIS — E119 Type 2 diabetes mellitus without complications: Secondary | ICD-10-CM | POA: Insufficient documentation

## 2021-08-12 DIAGNOSIS — Z7982 Long term (current) use of aspirin: Secondary | ICD-10-CM | POA: Insufficient documentation

## 2021-08-12 DIAGNOSIS — Z951 Presence of aortocoronary bypass graft: Secondary | ICD-10-CM | POA: Diagnosis not present

## 2021-08-12 DIAGNOSIS — Z7984 Long term (current) use of oral hypoglycemic drugs: Secondary | ICD-10-CM | POA: Insufficient documentation

## 2021-08-12 DIAGNOSIS — J449 Chronic obstructive pulmonary disease, unspecified: Secondary | ICD-10-CM | POA: Diagnosis not present

## 2021-08-12 DIAGNOSIS — H538 Other visual disturbances: Secondary | ICD-10-CM | POA: Diagnosis not present

## 2021-08-12 DIAGNOSIS — Z79899 Other long term (current) drug therapy: Secondary | ICD-10-CM | POA: Diagnosis not present

## 2021-08-12 DIAGNOSIS — R030 Elevated blood-pressure reading, without diagnosis of hypertension: Secondary | ICD-10-CM

## 2021-08-12 DIAGNOSIS — Z7902 Long term (current) use of antithrombotics/antiplatelets: Secondary | ICD-10-CM | POA: Diagnosis not present

## 2021-08-12 LAB — CBC WITH DIFFERENTIAL/PLATELET
Abs Immature Granulocytes: 0.01 10*3/uL (ref 0.00–0.07)
Basophils Absolute: 0 10*3/uL (ref 0.0–0.1)
Basophils Relative: 1 %
Eosinophils Absolute: 0.1 10*3/uL (ref 0.0–0.5)
Eosinophils Relative: 2 %
HCT: 38.7 % (ref 36.0–46.0)
Hemoglobin: 12.7 g/dL (ref 12.0–15.0)
Immature Granulocytes: 0 %
Lymphocytes Relative: 41 %
Lymphs Abs: 1.8 10*3/uL (ref 0.7–4.0)
MCH: 29.3 pg (ref 26.0–34.0)
MCHC: 32.8 g/dL (ref 30.0–36.0)
MCV: 89.4 fL (ref 80.0–100.0)
Monocytes Absolute: 0.5 10*3/uL (ref 0.1–1.0)
Monocytes Relative: 12 %
Neutro Abs: 2 10*3/uL (ref 1.7–7.7)
Neutrophils Relative %: 44 %
Platelets: 218 10*3/uL (ref 150–400)
RBC: 4.33 MIL/uL (ref 3.87–5.11)
RDW: 14.9 % (ref 11.5–15.5)
WBC: 4.4 10*3/uL (ref 4.0–10.5)
nRBC: 0 % (ref 0.0–0.2)

## 2021-08-12 LAB — BASIC METABOLIC PANEL
Anion gap: 7 (ref 5–15)
BUN: 14 mg/dL (ref 8–23)
CO2: 27 mmol/L (ref 22–32)
Calcium: 8.8 mg/dL — ABNORMAL LOW (ref 8.9–10.3)
Chloride: 104 mmol/L (ref 98–111)
Creatinine, Ser: 0.92 mg/dL (ref 0.44–1.00)
GFR, Estimated: >60 mL/min (ref >60)
Glucose, Bld: 188 mg/dL — ABNORMAL HIGH (ref 70–99)
Potassium: 3.7 mmol/L (ref 3.5–5.1)
Sodium: 138 mmol/L (ref 135–145)

## 2021-08-12 LAB — TROPONIN I (HIGH SENSITIVITY)
Troponin I (High Sensitivity): 8 ng/L (ref <18)
Troponin I (High Sensitivity): 8 ng/L (ref <18)

## 2021-08-12 LAB — HCG, SERUM, QUALITATIVE: Preg, Serum: NEGATIVE

## 2021-08-12 MED ORDER — CARVEDILOL 12.5 MG PO TABS
25.0000 mg | ORAL_TABLET | Freq: Once | ORAL | Status: AC
Start: 2021-08-12 — End: 2021-08-12
  Administered 2021-08-12: 25 mg via ORAL

## 2021-08-12 MED ORDER — LOSARTAN POTASSIUM 25 MG PO TABS
25.0000 mg | ORAL_TABLET | Freq: Once | ORAL | Status: AC
  Administered 2021-08-12: 25 mg via ORAL
  Filled 2021-08-12: qty 1

## 2021-08-12 MED ORDER — CARVEDILOL 12.5 MG PO TABS
25.0000 mg | ORAL_TABLET | Freq: Two times a day (BID) | ORAL | Status: DC
  Filled 2021-08-12: qty 2

## 2021-08-12 NOTE — ED Provider Notes (Signed)
[facility] Provider Note   CSN: [id number] Arrival date & time: 08/12/21  1815     History  Chief Complaint  Patient presents with   Hypertension    Sabrina Swanson is a 61 y.o. female.  Patient with history of diabetes, hypertension, CABG, COPD, hyperlipidemia presenting with elevated blood pressure.  States her blood pressure has been as high as 197 systolic at home over the past several days but is normally in the 130 range.  She saw her PCP today and her blood pressure was elevated.  She reports that PCP was not concerned and did not make any medication adjustments.  She takes Coreg and losartan and states compliance so she did not take her medications tonight.  She is concerned that her blood pressure still elevated despite taking her medications at home.  There were no medication adjustments made by her doctor today.  She cannot tell me the name of the doctor.  States compliance with her medications otherwise.  No chest pain or shortness of breath.  No dizziness or lightheadedness.  Mild blurry vision.  Contrary to triage note she denies headache.  No focal weakness, numbness or tingling.  No fever.  No focal weakness, numbness or tingling.  No room spinning dizziness or lightheadedness. States her blood pressure improved while she was at the doctor's office but she is still concerned that has been high at home in the past several days  The history is provided by the patient.  Hypertension       Home Medications Prior to Admission medications   Medication Sig Start Date End Date Taking? Authorizing Provider  albuterol (VENTOLIN HFA) 108 (90 Base) MCG/ACT inhaler Inhale 2 puffs into the lungs every 4 (four) hours as needed for shortness of breath. 07/19/16   [provider]  aspirin 81 MG EC tablet Take 81 mg by mouth daily.    [provider]  atorvastatin (LIPITOR) 80 MG tablet Take 0.5 tablets (40 mg total) by mouth daily.  08/27/20 08/27/21  Revankar, Reita Cliche, MD  carvedilol (COREG) 25 MG tablet Take 1 tablet (25 mg total) by mouth 2 (two) times daily with a meal. 05/01/20   Roddenberry, Arlis Porta, PA-C  clopidogrel (PLAVIX) 75 MG tablet TAKE 1 TABLET(75 MG) BY MOUTH DAILY 06/08/21   Revankar, Reita Cliche, MD  dapagliflozin propanediol (FARXIGA) 10 MG TABS tablet Take 1 tablet (10 mg total) by mouth daily before breakfast. 05/01/20   Roddenberry, Arlis Porta, PA-C  ergocalciferol (VITAMIN D2) 1.25 MG (50000 UT) capsule Take 50,000 Units by mouth every 7 (seven) days. 01/23/19   [provider]  fluticasone furoate-vilanterol (BREO ELLIPTA) 100-25 MCG/ACT AEPB Inhale 1 puff into the lungs daily. 03/19/21   Cobb, Karie Schwalbe, NP  Fluticasone-Umeclidin-Vilant (TRELEGY ELLIPTA) 100-62.5-25 MCG/ACT AEPB Inhale 1 puff into the lungs daily. 07/16/21   Cobb, Karie Schwalbe, NP  Fluticasone-Umeclidin-Vilant (TRELEGY ELLIPTA) 100-62.5-25 MCG/ACT AEPB Inhale 100 mcg into the lungs daily. 07/16/21   Cobb, Karie Schwalbe, NP  Ipratropium-Albuterol (COMBIVENT) 20-100 MCG/ACT AERS respimat Inhale 1 puff into the lungs daily in the afternoon. 07/19/16   [provider]  levofloxacin (LEVAQUIN) 500 MG tablet Take 500 mg by mouth daily. 05/06/20   [provider]  losartan (COZAAR) 25 MG tablet Take 25 mg by mouth 2 (two) times daily. 11/16/20   [provider]  metFORMIN (GLUCOPHAGE-XR) 500 MG 24 hr tablet Take 500 mg by mouth daily. 10/17/19   [provider]  [facility]  4 MG/0.1ML LIQD nasal spray kit Place 4 mg into the nose once. 11/22/19   [provider]  nitroGLYCERIN (NITROSTAT) 0.4 MG SL tablet Place 0.4 mg under the tongue every 5 (five) minutes as needed for chest pain.    [provider]  Omega-3 Fatty Acids (FISH OIL) 1000 MG CAPS Take 1,000 mg by mouth daily.    [provider]  oxyCODONE-acetaminophen (PERCOCET) 10-325 MG tablet Take 1 tablet by mouth every 8 (eight) hours. 05/17/20    [provider]  vitamin B-12 (CYANOCOBALAMIN) 1000 MCG tablet Take 1,000 mcg by mouth daily.    [provider]      Allergies    Acetaminophen    Review of Systems   Review of Systems  Physical Exam Updated Vital Signs BP (!) 167/103   Pulse 89   Temp 97.7 F (36.5 C) (Oral)   Resp 19   Ht '5\' 4"'  (1.626 m)   Wt 77.6 kg   SpO2 97%   BMI 29.35 kg/m  Physical Exam Vitals and nursing note reviewed.  Constitutional:      General: She is not in acute distress.    Appearance: She is well-developed.  HENT:     Head: Normocephalic and atraumatic.     Mouth/Throat:     Pharynx: No oropharyngeal exudate.  Eyes:     Conjunctiva/sclera: Conjunctivae normal.     Pupils: Pupils are equal, round, and reactive to light.  Neck:     Comments: No meningismus. Cardiovascular:     Rate and Rhythm: Normal rate and regular rhythm.     Heart sounds: Normal heart sounds. No murmur heard. Pulmonary:     Effort: Pulmonary effort is normal. No respiratory distress.     Breath sounds: Normal breath sounds.  Abdominal:     Palpations: Abdomen is soft.     Tenderness: There is no abdominal tenderness. There is no guarding or rebound.  Musculoskeletal:        General: No tenderness. Normal range of motion.     Cervical back: Normal range of motion and neck supple.  Skin:    General: Skin is warm.  Neurological:     Mental Status: She is alert and oriented to person, place, and time.     Cranial Nerves: No cranial nerve deficit.     Motor: No abnormal muscle tone.     Coordination: Coordination normal.     Comments: No ataxia on finger to nose bilaterally. No pronator drift. 5/5 strength throughout. CN 2-12 intact.Equal grip strength. Sensation intact.   Psychiatric:        Behavior: Behavior normal.     ED Results / Procedures / Treatments   Labs (all labs ordered are listed, but only abnormal results are displayed) Labs Reviewed  BASIC METABOLIC PANEL - Abnormal;  Notable for the following components:      Result Value   Glucose, Bld 188 (*)    Calcium 8.8 (*)    All other components within normal limits  CBC WITH DIFFERENTIAL/PLATELET  HCG, SERUM, QUALITATIVE  TROPONIN I (HIGH SENSITIVITY)  TROPONIN I (HIGH SENSITIVITY)    EKG EKG Interpretation  Date/Time:  Wednesday August 12 2021 18:23:52 EDT Ventricular Rate:  99 PR Interval:  178 QRS Duration: 90 QT Interval:  356 QTC Calculation: 456 R Axis:   49 Text Interpretation: Normal sinus rhythm T wave abnormality, consider lateral ischemia Abnormal ECG When compared with ECG of 24-Apr-2020 06:45, PREVIOUS ECG IS PRESENT No significant  change was found Confirmed by Ezequiel Essex [phone]) on 08/12/2021 8:01:41 PM  Radiology CT Head Wo Contrast  Result Date: 08/12/2021 CLINICAL DATA:  Sudden severe headache EXAM: CT HEAD WITHOUT CONTRAST TECHNIQUE: Contiguous axial images were obtained from the base of the skull through the vertex without intravenous contrast. RADIATION DOSE REDUCTION: This exam was performed according to the departmental dose-optimization program which includes automated exposure control, adjustment of the mA and/or kV according to patient size and/or use of iterative reconstruction technique. COMPARISON:  None Available. FINDINGS: Brain: Scattered hypodensities throughout the periventricular white matter and left basal ganglia consistent with age-indeterminate small vessel ischemic change, likely chronic. No acute infarct or hemorrhage. Lateral ventricles and midline structures are otherwise unremarkable. No acute extra-axial fluid collections. No mass effect. Vascular: No hyperdense vessel or unexpected calcification. Skull: Normal. Negative for fracture or focal lesion. Sinuses/Orbits: No acute finding. Other: None. IMPRESSION: 1. Hypodensities in the periventricular white matter and left basal ganglia most consistent with chronic small vessel ischemic change. 2. Otherwise no acute  intracranial process. Electronically Signed   By: Randa Ngo M.D.   On: 08/12/2021 21:21    Procedures Procedures    Medications Ordered in ED Medications  losartan (COZAAR) tablet 25 mg (has no administration in time range)  carvedilol (COREG) tablet 25 mg (has no administration in time range)    ED Course/ Medical Decision Making/ A&P                           Medical Decision Making Amount and/or Complexity of Data Reviewed Labs: ordered. Decision-making details documented in ED Course. Radiology: ordered and independent interpretation performed. Decision-making details documented in ED Course. ECG/medicine tests: ordered and independent interpretation performed. Decision-making details documented in ED Course.  Risk Prescription drug management.   Patient presents with concern for elevated blood pressure.  She is neurologically intact and denies headache.  No chest pain or shortness of breath.  States compliance with her medications other than not taking them tonight.  Will check basic labs, give home medications  Labs reassuring.  No evidence of endorgan damage.  Patient given her home medications and blood pressure improved to 168/95.  CT head is negative for acute ischemic injury or hemorrhage. Troponin negative. Low suspicion for ACS or PE or aortic dissection.  She denies any chest pain or shortness of breath.  Denies any headache.  Discussed follow-up with her PCP for further blood pressure medication adjustments.        Final Clinical Impression(s) / ED Diagnoses Final diagnoses:  Elevated blood pressure reading    Rx / DC Orders ED Discharge Orders     None         Adriella Essex, Annie Main, MD 08/12/21 2152

## 2021-08-12 NOTE — ED Notes (Signed)
Pt in bed resting and on phone. Family at bedside. Denies any needs at this time.

## 2021-08-12 NOTE — Telephone Encounter (Signed)
Patient is returning phone call. Patient phone number is [phone].

## 2021-08-12 NOTE — Telephone Encounter (Signed)
Attempted to call pt but unable to reach. Unable to leave a VM as mailbox has not been set up yet. Will try to call back later.

## 2021-08-12 NOTE — ED Triage Notes (Signed)
Patient reports her blood pressure has been high since yesterday, was seen by her PCP yesterday and was not given any new instructions/medications. Patient reports headache yesterday.

## 2021-08-12 NOTE — Discharge Instructions (Signed)
,   ButTake your blood pressure medications as prescribed and keep a record of your blood pressures and your doctor can make further adjustments.  Return to the ED with exertional chest pain, pain associate with shortness of breath, nausea, vomiting, sweating, severe headache changes, unilateral neurological deficit or any other concerns.

## 2021-08-12 NOTE — Telephone Encounter (Signed)
Called and spoke with patient, advised her that she had a test that shows that her oxygen level drops when she is asleep and that when she wears her oxygen with her CPAP machine her oxygen level returns to normal.  I let her know that she has moderate to severe sleep apnea and that the CPAP machine treats the sleep apnea and the oxygen corrects her oxygen levels.  She verbalized understanding.  Nothing further needed.

## 2021-08-13 ENCOUNTER — Telehealth: Payer: Self-pay | Admitting: Cardiology

## 2021-08-13 NOTE — Telephone Encounter (Signed)
Patient advised of Dr. Julien Nordmann recommendations.  She thanked me for the call and had no additional questions.

## 2021-08-13 NOTE — Telephone Encounter (Signed)
Patient walked in stating that she was in the hospital last night for high blood pressure, and was told she needed to see her cardiologist. She has an appointment scheduled for 8/30 with Dr. Geraldo Pitter and he has nothing sooner. Offered for her to go to [employer] and she didn't want to drive there, she said something about seeing Dr. Harriet Masson and she didn't want to drive to [place]. If someone could get in touch with her today and let her know what she should do. Thank you CB #

## 2021-09-01 ENCOUNTER — Ambulatory Visit: Payer: Medicare Other | Admitting: Cardiology

## 2021-09-02 ENCOUNTER — Ambulatory Visit: Payer: Medicare Other | Admitting: Cardiology

## 2021-10-08 ENCOUNTER — Encounter: Payer: Self-pay | Admitting: Pulmonary Disease

## 2021-10-08 ENCOUNTER — Ambulatory Visit (INDEPENDENT_AMBULATORY_CARE_PROVIDER_SITE_OTHER): Payer: Medicare Other | Admitting: Pulmonary Disease

## 2021-10-08 VITALS — BP 132/84 | HR 76 | Temp 98.2°F | Ht 64.0 in | Wt 189.2 lb

## 2021-10-08 DIAGNOSIS — J9611 Chronic respiratory failure with hypoxia: Secondary | ICD-10-CM

## 2021-10-08 DIAGNOSIS — G4733 Obstructive sleep apnea (adult) (pediatric): Secondary | ICD-10-CM

## 2021-10-08 NOTE — Patient Instructions (Signed)
I will see you back in about 3 months  Call with significant concerns  Continue to use your CPAP nightly  Make sure you use your pulse oximeter to help you decide on how much oxygen you need to be on, need to keep oxygen above 90%

## 2021-10-08 NOTE — Progress Notes (Signed)
Sabrina Swanson    [id number]    12/28/1960  Primary Care Physician:Lim, Barbera Setters, DO  Referring Physician: Wynelle Fanny, [address],  [place] [address]  Chief complaint:   In for follow-up today for shortness of breath Shortness of breath developed postcardiac surgery  HPI:  Has been relatively well  Uses a CPAP, tries to use it nightly  Has been trying to stay active  Recently with a cough, chest congestion Called in a prescription for Levaquin  Between the last time that she was here and presently she had a pulmonary function test performed showing obstructive lung disease with significant bronchodilator response  She remains on Trelegy, compliant  She is not back to exercising on a regular basis and she is having discussions with her primary doctor about physical therapy Denies significant shortness of breath at rest  Previous chest x-ray findings showing hypoventilation, multifocal infiltrate, elevated left hemidiaphragm which has progressively improved  Reformed smoker No pertinent occupational history  chronic obstructive pulmonary disease She was not having significant shortness of breath prior to diagnosis of coronary artery disease   Outpatient Encounter Medications as of 10/08/2021  Medication Sig   albuterol (VENTOLIN HFA) 108 (90 Base) MCG/ACT inhaler Inhale 2 puffs into the lungs every 4 (four) hours as needed for shortness of breath.   aspirin 81 MG EC tablet Take 81 mg by mouth daily.   carvedilol (COREG) 25 MG tablet Take 1 tablet (25 mg total) by mouth 2 (two) times daily with a meal.   clopidogrel (PLAVIX) 75 MG tablet TAKE 1 TABLET(75 MG) BY MOUTH DAILY   dapagliflozin propanediol (FARXIGA) 10 MG TABS tablet Take 1 tablet (10 mg total) by mouth daily before breakfast.   ergocalciferol (VITAMIN D2) 1.25 MG (50000 UT) capsule Take 50,000 Units by mouth every 7 (seven) days.   fluticasone furoate-vilanterol (BREO ELLIPTA)  100-25 MCG/ACT AEPB Inhale 1 puff into the lungs daily.   Fluticasone-Umeclidin-Vilant (TRELEGY ELLIPTA) 100-62.5-25 MCG/ACT AEPB Inhale 1 puff into the lungs daily.   Ipratropium-Albuterol (COMBIVENT) 20-100 MCG/ACT AERS respimat Inhale 1 puff into the lungs daily in the afternoon.   levofloxacin (LEVAQUIN) 500 MG tablet Take 500 mg by mouth daily.   losartan (COZAAR) 25 MG tablet Take 25 mg by mouth 2 (two) times daily.   metFORMIN (GLUCOPHAGE-XR) 500 MG 24 hr tablet Take 500 mg by mouth daily.   NARCAN 4 MG/0.1ML LIQD nasal spray kit Place 4 mg into the nose once.   nitroGLYCERIN (NITROSTAT) 0.4 MG SL tablet Place 0.4 mg under the tongue every 5 (five) minutes as needed for chest pain.   Omega-3 Fatty Acids (FISH OIL) 1000 MG CAPS Take 1,000 mg by mouth daily.   oxyCODONE-acetaminophen (PERCOCET) 10-325 MG tablet Take 1 tablet by mouth every 8 (eight) hours.   vitamin B-12 (CYANOCOBALAMIN) 1000 MCG tablet Take 1,000 mcg by mouth daily.   atorvastatin (LIPITOR) 80 MG tablet Take 0.5 tablets (40 mg total) by mouth daily.   Fluticasone-Umeclidin-Vilant (TRELEGY ELLIPTA) 100-62.5-25 MCG/ACT AEPB Inhale 100 mcg into the lungs daily.   No facility-administered encounter medications on file as of 10/08/2021.    Allergies as of 10/08/2021 - Review Complete 10/08/2021  Allergen Reaction Noted   Acetaminophen Itching and Nausea Only 07/13/2016    Past Medical History:  Diagnosis Date   Abnormal stress test 04/16/2020   Acquired trigger finger 06/10/2012   Alopecia 05/16/2013   Angina pectoris ([place]) 11/13/2019  Anxiety state 06/10/2012   Arthritis of right acromioclavicular joint 11/27/2018   Back pain    Benign neoplasm of colon 06/10/2012   Bursitis disorder 05/11/2011   CAD (coronary artery disease), native coronary artery 04/22/2020   Cardiac murmur 11/13/2019   Carpal tunnel syndrome 06/10/2012   Formatting of this note might be different from the original. bilateral   Chalazion right upper eyelid  06/30/2015   Continuous dependence on cigarette smoking 11/13/2019   COPD (chronic obstructive pulmonary disease) ([place]) 05/16/2013   DDD (degenerative disc disease), lumbosacral 06/10/2012   Depressive disorder 06/10/2012   Diabetes mellitus due to underlying condition with hyperosmolarity without coma, without long-term current use of insulin ([place]) 04/16/2020   Diabetes mellitus without complication ([place])    Dyspareunia 03/04/2014   Dysthymia 06/10/2012   Encounter for long-term (current) use of other medications 06/10/2012   Essential hypertension 08/14/2019   Former smoker 05/21/2020   Generalized anxiety disorder 06/10/2012   Hammer toe of right foot 10/23/2014   Formatting of this note might be different from the original. Second and third tarsals Formatting of this note might be different from the original. Formatting of this note might be different from the original. Second and third tarsals   High cholesterol    Hypercholesterolemia 03/06/2013   Hypertension    Insomnia 06/10/2012   Lumbosacral spondylosis 06/10/2012   Lump of skin 10/23/2014   Formatting of this note might be different from the original. Right breast-manipulated by patient prior to clinical exam   Migraine syndrome 06/10/2012   Mixed hyperlipidemia 04/16/2020   Numbness of toes 10/23/2014   Osteoarthrosis, generalized, involving multiple sites 06/10/2012   Pain in soft tissues of limb 06/10/2012   Formatting of this note might be different from the original. 03/09/11 right arm.   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   Unspecified cataract 06/30/2015   Vertigo 06/14/2013   Vestibular dizziness 06/10/2012    Past Surgical History:  Procedure Laterality Date   ABDOMINAL HYSTERECTOMY     CESAREAN SECTION     CORONARY ARTERY BYPASS GRAFT N/A 04/23/2020   Procedure: CORONARY ARTERY BYPASS GRAFTING (CABG), ON PUMP, TIMES THREE, USING LEFT INTERNAL MAMMARY ARTERY AND ENDOSCOPICALLY HARVESTED RIGHT GREATER SAPHENOUS VEIN;  Surgeon: [name], MD;  Location: [facility];  Service: Open Heart Surgery;  Laterality: N/A;   LEFT HEART CATH AND CORONARY ANGIOGRAPHY N/A 04/22/2020   Procedure: LEFT HEART CATH AND CORONARY ANGIOGRAPHY;  Surgeon: Troy Sine, MD;  Location: [facility];  Service: Cardiovascular;  Laterality: N/A;   TEE WITHOUT CARDIOVERSION N/A 04/23/2020   Procedure: TRANSESOPHAGEAL ECHOCARDIOGRAM (TEE);  Surgeon: [name], MD;  Location: [facility];  Service: Open Heart Surgery;  Laterality: N/A;    Family History  Problem Relation Age of Onset   Stomach cancer Mother    Hypertension Maternal Grandmother    Diabetes Maternal Grandfather     Social History   Socioeconomic History   Marital status: Single    Spouse name: Not on  file   Number of children: Not on file   Years of education: Not on file   Highest education level: Not on file  Occupational History   Not on file  Tobacco Use   Smoking status: Former    Packs/day: 1.00    Types: Cigarettes    Quit date: 04/22/2020    Years since quitting: 1.4    Passive exposure: Past   Smokeless tobacco: Never  Vaping Use   Vaping Use: Never used  Substance and Sexual Activity   Alcohol use: Yes    Comment: occasional   Drug use: No    Comment: did eat marijuana brownie last week   Sexual activity: Not on file  Other Topics Concern   Not on file  Social History Narrative   Not on file   Social Determinants of Health   Financial Resource Strain: Not on file  Food Insecurity:  Not on file  Transportation Needs: Not on file  Physical Activity: Not on file  Stress: Not on file  Social Connections: Not on file  Intimate Partner Violence: Not on file    Review of Systems  Constitutional:  Negative for fatigue.  Respiratory:  Positive for shortness of breath. Negative for chest tightness.     Vitals:   10/08/21 1146  BP: 132/84  Pulse: 76  Temp: 98.2 F (36.8 C)  SpO2: 100%     Physical Exam Constitutional:      Appearance: She is obese.  HENT:     Head: Normocephalic.     Nose: No congestion.     Mouth/Throat:     Mouth: Mucous membranes are moist.  Cardiovascular:     Rate and Rhythm: Normal rate and regular rhythm.     Heart sounds: No murmur heard.    No friction rub.  Pulmonary:     Effort: No respiratory distress.     Breath sounds: No stridor. No wheezing or rhonchi.  Musculoskeletal:     Cervical back: No rigidity or tenderness.  Neurological:     General: No focal deficit present.     Mental Status: She is alert and oriented to person, place, and time.  Psychiatric:        Mood and Affect: Mood normal.   CPAP compliance reviewed showing 57% compliance Average use of 6 hours 13 minutes Setting of 5-15 95 percentile pressure of 13.6 Residual AHI of 0.8  Data Reviewed:  CT scan of the chest reviewed-last 1 was 04/28/2020  Pulmonary function test reviewed and does reveal obstructive disease Severe obstructive disease with significant bronchodilator response  Assessment:  Shortness of breath on exertion -Symptoms remain about the same -Importance of graded exercises   Bronchitis/COPD exacerbation -Recently started on Levaquin -Continues on Trelegy  Severe obstructive lung disease --This is this is obvious on recent pulmonary function test -There is significant bronchodilator response -She continues to benefit from use of Trelegy  Coronary artery disease Diastolic heart failure Diaphragmatic dysfunction  Moderate  obstructive sleep apnea -On CPAP therapy -Auto CPAP 5-15  Plan: .  Encouraged to continue exercises on a regular basis  .  Encouraged to continue using Trelegy  .  Continue oxygen supplementation as needed  .  Encouraged to procure a pulse oximeter to guide oxygen use  .  I will see her back in the office in about 6 months   Sherrilyn Rist MD [facility] 10/08/2021, 12:01 PM  CC: Wynelle Fanny, DO

## 2021-10-26 ENCOUNTER — Other Ambulatory Visit: Payer: Self-pay

## 2021-11-04 ENCOUNTER — Ambulatory Visit: Payer: Medicare Other | Attending: Cardiology | Admitting: Cardiology

## 2021-11-04 ENCOUNTER — Encounter: Payer: Self-pay | Admitting: Cardiology

## 2021-11-04 VITALS — BP 120/78 | HR 70 | Ht 63.0 in | Wt 189.1 lb

## 2021-11-04 DIAGNOSIS — E782 Mixed hyperlipidemia: Secondary | ICD-10-CM | POA: Diagnosis not present

## 2021-11-04 DIAGNOSIS — I2510 Atherosclerotic heart disease of native coronary artery without angina pectoris: Secondary | ICD-10-CM

## 2021-11-04 DIAGNOSIS — I10 Essential (primary) hypertension: Secondary | ICD-10-CM

## 2021-11-04 DIAGNOSIS — E0800 Diabetes mellitus due to underlying condition with hyperosmolarity without nonketotic hyperglycemic-hyperosmolar coma (NKHHC): Secondary | ICD-10-CM | POA: Diagnosis not present

## 2021-11-04 DIAGNOSIS — Z951 Presence of aortocoronary bypass graft: Secondary | ICD-10-CM

## 2021-11-04 NOTE — Progress Notes (Signed)
Cardiology Office Note:    Date:  11/04/2021   ID:  Sabrina Swanson, DOB 30-May-1960, MRN [id number]  PCP:  Wynelle Fanny, DO  Cardiologist:  Jenean Lindau, MD   Referring MD: Wynelle Fanny, DO    ASSESSMENT:    1. Coronary artery disease involving native coronary artery of native heart without angina pectoris   2. Essential hypertension   3. Mixed hyperlipidemia   4. Diabetes mellitus due to underlying condition with hyperosmolarity without coma, without long-term current use of insulin ([place])   5. S/P CABG x 3    PLAN:    In order of problems listed above:  Coronary artery disease: Secondary prevention stressed with the patient.  Importance of compliance with diet medication stressed and she vocalized understanding.  She was advised to walk at least half an hour a day 5 days a week and she promises to do so. Essential hypertension: Blood pressure stable and diet was emphasized.  Lifestyle modification urged. Diabetes mellitus, mixed dyslipidemia and obesity: Weight reduction stressed.  Diet emphasized.  Lifestyle modification urged.  Her diabetes mellitus and lipids are not under control.  I am not sure whether I want to increase medications as she tells me that she has not been compliant with dietary instructions.  I explained to her that if she promises to do so.  This will be followed by primary care.  Goal LDL must be less than 60.  Risks of obesity explained and she vocalized understanding and questions were answered to her satisfaction. Patient will be seen in follow-up appointment in 9 months or earlier if the patient has any concerns    Medication Adjustments/Labs and Tests Ordered: Current medicines are reviewed at length with the patient today.  Concerns regarding medicines are outlined above.  No orders of the defined types were placed in this encounter.  No orders of the defined types were placed in this encounter.    No chief complaint on file.    History of Present  Illness:    Sabrina Swanson is a 61 y.o. female.  Patient has past medical history of coronary artery disease post CABG surgery, essential hypertension, dyslipidemia, diabetes mellitus and obesity.  She denies any problems at this time and takes care of activities of daily living.  No chest pain orthopnea or PND.  She leads a sedentary lifestyle.  She quit smoking after her bypass surgery.  At the time of my evaluation, the patient is alert awake oriented and in no distress.  Past Medical History:  Diagnosis Date   Abnormal stress test 04/16/2020   Acquired trigger finger 06/10/2012   Alopecia 05/16/2013   Angina pectoris ([place]) 11/13/2019   Anxiety state 06/10/2012   Arthritis of right acromioclavicular joint 11/27/2018   Back pain    Benign neoplasm of colon 06/10/2012   Bursitis disorder 05/11/2011   CAD (coronary artery disease), native coronary artery 04/22/2020   Cardiac murmur 11/13/2019   Carpal tunnel syndrome 06/10/2012   Formatting of this note might be different from the original. 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   DDD (degenerative disc disease), lumbosacral 06/10/2012   Depressive disorder 06/10/2012   Diabetes mellitus due to underlying condition with hyperosmolarity without coma, without long-term current use of insulin ([place]) 04/16/2020   Diabetes mellitus without complication (  [place])    Dyspareunia 03/04/2014   Dysthymia 06/10/2012   Encounter for long-term (current) use of other medications 06/10/2012   Essential hypertension 08/14/2019   Former smoker 05/21/2020   Generalized anxiety disorder 06/10/2012   Hammer toe of right foot 10/23/2014   Formatting of this note might be different from the original. Second and third tarsals Formatting of this note might be different from  the original. Formatting of this note might be different from the original. Second and third tarsals   High cholesterol    Hypercholesterolemia 03/06/2013   Hypertension    Insomnia 06/10/2012   Lumbosacral spondylosis 06/10/2012   Lump of skin 10/23/2014   Formatting of this note might be different from the original. Right breast-manipulated by patient prior to clinical exam   Migraine syndrome 06/10/2012   Mixed hyperlipidemia 04/16/2020   Moderate obstructive sleep apnea 07/16/2021   Numbness of toes 10/23/2014   Osteoarthrosis, generalized, involving multiple sites 06/10/2012   Pain in soft tissues of limb 06/10/2012   Formatting of this note might be different from the original. 03/09/11 right arm.   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   Sensorineural hearing loss 06/10/2012   Shoulder impingement, right 11/27/2018   Snoring 03/19/2021   Subacute vaginitis 07/18/2015   Tendinopathy of rotator cuff, right 11/27/2018   Tinnitus 03/21/2013   Tobacco abuse 04/16/2020   Tobacco use disorder 03/06/2013   Type 2 diabetes, controlled, with neuropathy ([place]) 10/21/2014   Unspecified cataract 06/30/2015   Vertigo 06/14/2013   Vestibular dizziness 06/10/2012    Past Surgical History:  Procedure Laterality Date   ABDOMINAL HYSTERECTOMY     CESAREAN SECTION     CORONARY ARTERY BYPASS GRAFT N/A 04/23/2020   Procedure: CORONARY ARTERY BYPASS GRAFTING (CABG), ON PUMP, TIMES THREE, USING LEFT INTERNAL MAMMARY ARTERY AND ENDOSCOPICALLY HARVESTED RIGHT GREATER SAPHENOUS VEIN;  Surgeon: [name], MD;  Location: [facility];  Service: Open Heart Surgery;  Laterality: N/A;   LEFT HEART CATH AND CORONARY ANGIOGRAPHY N/A 04/22/2020   Procedure: LEFT HEART CATH AND CORONARY ANGIOGRAPHY;  Surgeon: Troy Sine, MD;  Location: [facility];  Service:  Cardiovascular;  Laterality: N/A;   TEE WITHOUT CARDIOVERSION N/A 04/23/2020   Procedure: TRANSESOPHAGEAL ECHOCARDIOGRAM (TEE);  Surgeon: [name], MD;  Location: [facility];  Service: Open Heart Surgery;  Laterality: N/A;    Current Medications: Current Meds  Medication Sig   albuterol (VENTOLIN HFA) 108 (90 Base) MCG/ACT inhaler Inhale 2 puffs into the lungs every 4 (four) hours as needed for shortness of breath.   aspirin 81 MG EC tablet Take 81 mg by mouth daily.   atorvastatin (LIPITOR) 80 MG tablet Take 40 mg by mouth daily.   carvedilol (COREG) 25 MG tablet Take 1 tablet (25 mg total) by mouth 2 (two) times daily with a meal.   clopidogrel (PLAVIX) 75 MG tablet TAKE 1 TABLET(75 MG) BY MOUTH DAILY   dapagliflozin propanediol (FARXIGA) 10 MG TABS tablet Take 1 tablet (10 mg total) by mouth daily before breakfast.   ergocalciferol (VITAMIN D2) 1.25 MG (50000 UT) capsule Take 50,000 Units by mouth every 7 (seven) days.   fluticasone furoate-vilanterol (BREO ELLIPTA) 100-25 MCG/ACT AEPB Inhale 1 puff into the lungs daily.   Fluticasone-Umeclidin-Vilant (TRELEGY ELLIPTA) 100-62.5-25 MCG/ACT AEPB Inhale 1 puff  into the lungs daily.   Fluticasone-Umeclidin-Vilant (TRELEGY ELLIPTA) 100-62.5-25 MCG/ACT AEPB Inhale 100 mcg into the lungs daily.   Ipratropium-Albuterol (COMBIVENT) 20-100 MCG/ACT AERS respimat Inhale 1 puff into the lungs daily in the afternoon.   levofloxacin (LEVAQUIN) 500 MG tablet Take 500 mg by mouth daily.   losartan (COZAAR) 50 MG tablet Take 50 mg by mouth daily.   metFORMIN (GLUCOPHAGE-XR) 500 MG 24 hr tablet Take 500 mg by mouth daily.   NARCAN 4 MG/0.1ML LIQD nasal spray kit Place 4 mg into the nose once.   nitroGLYCERIN (NITROSTAT) 0.4 MG SL tablet Place 0.4 mg under the tongue every 5 (five) minutes as needed for chest pain.   Omega-3 Fatty Acids (FISH OIL) 1000 MG CAPS Take 1,000 mg by mouth daily.   oxyCODONE-acetaminophen (PERCOCET) 10-325 MG tablet Take 1  tablet by mouth every 8 (eight) hours.   vitamin B-12 (CYANOCOBALAMIN) 1000 MCG tablet Take 1,000 mcg by mouth daily.     Allergies:   Acetaminophen   Social History   Socioeconomic History   Marital status: Single    Spouse name: Not on file   Number of children: Not on file   Years of education: Not on file   Highest education level: Not on file  Occupational History   Not on file  Tobacco Use   Smoking status: Former    Packs/day: 1.00    Types: Cigarettes    Quit date: 04/22/2020    Years since quitting: 1.5    Passive exposure: Past   Smokeless tobacco: Never  Vaping Use   Vaping Use: Never used  Substance and Sexual Activity   Alcohol use: Yes    Comment: occasional   Drug use: No    Comment: did eat marijuana brownie last week   Sexual activity: Not on file  Other Topics Concern   Not on file  Social History Narrative   Not on file   Social Determinants of Health   Financial Resource Strain: Not on file  Food Insecurity: Not on file  Transportation Needs: Not on file  Physical Activity: Not on file  Stress: Not on file  Social Connections: Not on file     Family History: The patient's family history includes Diabetes in her maternal grandfather; Hypertension in her maternal grandmother; Stomach cancer in her mother.  ROS:   Please see the history of present illness.    All other systems reviewed and are negative.  EKGs/Labs/Other Studies Reviewed:    The following studies were reviewed today: I discussed my findings with the patient at length.   Recent Labs: 07/01/2021: ALT 39 08/12/2021: BUN 14; Creatinine, Ser 0.92; Hemoglobin 12.7; Platelets 218; Potassium 3.7; Sodium 138  Recent Lipid Panel    Component Value Date/Time   CHOL 153 02/20/2021 0903   TRIG 93 02/20/2021 0903   HDL 35 (L) 02/20/2021 0903   CHOLHDL 4.4 02/20/2021 0903   CHOLHDL 5.5 05/01/2020 0025   VLDL 22 05/01/2020 0025   LDLCALC 100 (H) 02/20/2021 0903    Physical Exam:     VS:  BP 120/78   Pulse 70   Ht _0  (1.6 m)   Wt 189 lb 1.9 oz (85.8 kg)   SpO2 97%   BMI 33.50 kg/m     Wt Readings from Last 3 Encounters:  11/04/21 189 lb 1.9 oz (85.8 kg)  10/08/21 189 lb 3.2 oz (85.8 kg)  08/12/21 171 lb (77.6 kg)     GEN: Patient is in  no acute distress HEENT: Normal NECK: No JVD; No carotid bruits LYMPHATICS: No lymphadenopathy CARDIAC: Hear sounds regular, 2/6 systolic murmur at the apex. RESPIRATORY:  Clear to auscultation without rales, wheezing or rhonchi  ABDOMEN: Soft, non-tender, non-distended MUSCULOSKELETAL:  No edema; No deformity  SKIN: Warm and dry NEUROLOGIC:  Alert and oriented x 3 PSYCHIATRIC:  Normal affect   Signed, Jenean Lindau, MD  11/04/2021 11:38 AM    [place]

## 2021-11-04 NOTE — Patient Instructions (Signed)
Medication Instructions:  ?Your physician recommends that you continue on your current medications as directed. Please refer to the Current Medication list given to you today. ? ?*If you need a refill on your cardiac medications before your next appointment, please call your pharmacy* ? ? ?Lab Work: ?None ordered ?If you have labs (blood work) drawn today and your tests are completely normal, you will receive your results only by: ?MyChart Message (if you have MyChart) OR ?A paper copy in the mail ?If you have any lab test that is abnormal or we need to change your treatment, we will call you to review the results. ? ? ?Testing/Procedures: ?Your physician has requested that you have an echocardiogram. Echocardiography is a painless test that uses sound waves to create images of your heart. It provides your doctor with information about the size and shape of your heart and how well your heart?s chambers and valves are working. This procedure takes approximately one hour. There are no restrictions for this procedure. ? ? ? ?Follow-Up: ?At [facility], you and your health needs are our priority.  As part of our continuing mission to provide you with exceptional heart care, we have created designated Provider Care Teams.  These Care Teams include your primary Cardiologist (physician) and Advanced Practice Providers (APPs -  Physician Assistants and Nurse Practitioners) who all work together to provide you with the care you need, when you need it. ? ?We recommend signing up for the patient portal called "MyChart".  Sign up information is provided on this After Visit Summary.  MyChart is used to connect with patients for Virtual Visits (Telemedicine).  Patients are able to view lab/test results, encounter notes, upcoming appointments, etc.  Non-urgent messages can be sent to your provider as well.   ?To learn more about what you can do with MyChart, go to [email or web address].   ? ?Your next appointment:   ?9  month(s) ? ?The format for your next appointment:   ?In Person ? ?Provider:   ?Rajan Revankar, MD ? ? ?Other Instructions ?Echocardiogram ?An echocardiogram is a test that uses sound waves (ultrasound) to produce images of the heart. ?Images from an echocardiogram can provide important information about: ?Heart size and shape. ?The size and thickness and movement of your heart's walls. ?Heart muscle function and strength. ?Heart valve function or if you have stenosis. Stenosis is when the heart valves are too narrow. ?If blood is flowing backward through the heart valves (regurgitation). ?A tumor or infectious growth around the heart valves. ?Areas of heart muscle that are not working well because of poor blood flow or injury from a heart attack. ?Aneurysm detection. An aneurysm is a weak or damaged part of an artery wall. The wall bulges out from the normal force of blood pumping through the body. ?Tell a health care provider about: ?Any allergies you have. ?All medicines you are taking, including vitamins, herbs, eye drops, creams, and over-the-counter medicines. ?Any blood disorders you have. ?Any surgeries you have had. ?Any medical conditions you have. ?Whether you are pregnant or may be pregnant. ?What are the risks? ?Generally, this is a safe test. However, problems may occur, including an allergic reaction to dye (contrast) that may be used during the test. ?What happens before the test? ?No specific preparation is needed. You may eat and drink normally. ?What happens during the test? ?You will take off your clothes from the waist up and put on a hospital gown. ?Electrodes or electrocardiogram (ECG)patches may be placed on   your chest. The electrodes or patches are then connected to a device that monitors your heart rate and rhythm. ?You will lie down on a table for an ultrasound exam. A gel will be applied to your chest to help sound waves pass through your skin. ?A handheld device, called a transducer, will  be pressed against your chest and moved over your heart. The transducer produces sound waves that travel to your heart and bounce back (or "echo" back) to the transducer. These sound waves will be captured in real-time and changed into images of your heart that can be viewed on a video monitor. The images will be recorded on a computer and reviewed by your health care provider. ?You may be asked to change positions or hold your breath for a short time. This makes it easier to get different views or better views of your heart. ?In some cases, you may receive contrast through an IV in one of your veins. This can improve the quality of the pictures from your heart. ?The procedure may vary among health care providers and hospitals.   ?What can I expect after the test? ?You may return to your normal, everyday life, including diet, activities, and medicines, unless your health care provider tells you not to do that. ?Follow these instructions at home: ?It is up to you to get the results of your test. Ask your health care provider, or the department that is doing the test, when your results will be ready. ?Keep all follow-up visits. This is important. ?Summary ?An echocardiogram is a test that uses sound waves (ultrasound) to produce images of the heart. ?Images from an echocardiogram can provide important information about the size and shape of your heart, heart muscle function, heart valve function, and other possible heart problems. ?You do not need to do anything to prepare before this test. You may eat and drink normally. ?After the echocardiogram is completed, you may return to your normal, everyday life, unless your health care provider tells you not to do that. ?This information is not intended to replace advice given to you by your health care provider. Make sure you discuss any questions you have with your health care provider. ?Document Revised: 08/14/2019 Document Reviewed: 08/14/2019 ?Elsevier Patient  Education ? 2021 [employer] ? ? ?

## 2021-11-05 ENCOUNTER — Other Ambulatory Visit: Payer: Self-pay | Admitting: Physician Assistant

## 2021-11-11 ENCOUNTER — Ambulatory Visit (HOSPITAL_BASED_OUTPATIENT_CLINIC_OR_DEPARTMENT_OTHER)
Admission: RE | Admit: 2021-11-11 | Discharge: 2021-11-11 | Disposition: A | Discharge status: Home / Self Care | Payer: Medicare Other | Source: Ambulatory Visit | Attending: Cardiology | Admitting: Cardiology

## 2021-11-11 DIAGNOSIS — I2510 Atherosclerotic heart disease of native coronary artery without angina pectoris: Secondary | ICD-10-CM | POA: Insufficient documentation

## 2021-11-11 DIAGNOSIS — R0602 Shortness of breath: Secondary | ICD-10-CM | POA: Diagnosis not present

## 2021-11-11 LAB — ECHOCARDIOGRAM COMPLETE
AV Mean grad: 3 mmHg
AV Peak grad: 6 mmHg
Ao pk vel: 1.22 m/s
Area-P 1/2: 4.06 cm2
Calc EF: 57.9 %
S' Lateral: 2.9 cm
Single Plane A2C EF: 58.7 %
Single Plane A4C EF: 58 %

## 2022-02-02 ENCOUNTER — Ambulatory Visit: Payer: 59 | Admitting: Pulmonary Disease

## 2022-02-25 ENCOUNTER — Emergency Department (HOSPITAL_BASED_OUTPATIENT_CLINIC_OR_DEPARTMENT_OTHER): Payer: 59

## 2022-02-25 ENCOUNTER — Other Ambulatory Visit: Payer: Self-pay

## 2022-02-25 ENCOUNTER — Encounter (HOSPITAL_BASED_OUTPATIENT_CLINIC_OR_DEPARTMENT_OTHER): Payer: Self-pay | Admitting: Urology

## 2022-02-25 ENCOUNTER — Emergency Department (HOSPITAL_BASED_OUTPATIENT_CLINIC_OR_DEPARTMENT_OTHER)
Admission: EM | Admit: 2022-02-25 | Discharge: 2022-02-25 | Disposition: A | Discharge status: Home / Self Care | Payer: 59 | Attending: Emergency Medicine | Admitting: Emergency Medicine

## 2022-02-25 DIAGNOSIS — I10 Essential (primary) hypertension: Secondary | ICD-10-CM | POA: Insufficient documentation

## 2022-02-25 DIAGNOSIS — Z79899 Other long term (current) drug therapy: Secondary | ICD-10-CM | POA: Insufficient documentation

## 2022-02-25 DIAGNOSIS — Z7902 Long term (current) use of antithrombotics/antiplatelets: Secondary | ICD-10-CM | POA: Diagnosis not present

## 2022-02-25 DIAGNOSIS — M25461 Effusion, right knee: Secondary | ICD-10-CM | POA: Diagnosis not present

## 2022-02-25 DIAGNOSIS — Z7982 Long term (current) use of aspirin: Secondary | ICD-10-CM | POA: Diagnosis not present

## 2022-02-25 DIAGNOSIS — S79921A Unspecified injury of right thigh, initial encounter: Secondary | ICD-10-CM | POA: Diagnosis present

## 2022-02-25 DIAGNOSIS — Z7984 Long term (current) use of oral hypoglycemic drugs: Secondary | ICD-10-CM | POA: Insufficient documentation

## 2022-02-25 DIAGNOSIS — Y92410 Unspecified street and highway as the place of occurrence of the external cause: Secondary | ICD-10-CM | POA: Insufficient documentation

## 2022-02-25 DIAGNOSIS — R030 Elevated blood-pressure reading, without diagnosis of hypertension: Secondary | ICD-10-CM

## 2022-02-25 DIAGNOSIS — S20219A Contusion of unspecified front wall of thorax, initial encounter: Secondary | ICD-10-CM | POA: Insufficient documentation

## 2022-02-25 DIAGNOSIS — S7011XA Contusion of right thigh, initial encounter: Secondary | ICD-10-CM | POA: Diagnosis not present

## 2022-02-25 MED ORDER — METHOCARBAMOL 750 MG PO TABS: 750.0000 mg | ORAL_TABLET | Freq: Three times a day (TID) | ORAL | 0 refills | Status: DC | PRN

## 2022-02-25 MED ORDER — TRAMADOL HCL 50 MG PO TABS
50.0000 mg | ORAL_TABLET | Freq: Once | ORAL | Status: AC
  Administered 2022-02-25: 50 mg via ORAL
  Filled 2022-02-25: qty 1

## 2022-02-25 NOTE — ED Notes (Signed)
D/c paperwork reviewed with pt, including prescriptions and follow up care.  All questions and/or concerns addressed at time of d/c.  No further needs expressed. . Pt verbalized understanding, Ambulatory with family to ED exit, NAD.

## 2022-02-25 NOTE — Discharge Instructions (Addendum)
It was our pleasure to provide your ER care today - we hope that you feel better.  Take ibuprofen as need. You may also take robaxin as need for muscle pain/spasm - no driving for the next 6 hours, of if/when taking robaxin.   Follow up with primary care doctor/orthopedist in two weeks if symptoms fail to improve/resolve.  Your blood pressure is high today - continue your meds, follow heart healthy eating plan, and follow up closely with primary care doctor in the next 1-2 weeks.   Return to ER if worse, new symptoms, new/severe pain, severe headache, trouble breathing, abdominal pain, or other concern.
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
## 2022-03-05 ENCOUNTER — Other Ambulatory Visit: Payer: Self-pay | Admitting: Cardiology

## 2022-04-02 ENCOUNTER — Encounter: Payer: Self-pay | Admitting: Pulmonary Disease

## 2022-04-02 ENCOUNTER — Ambulatory Visit (INDEPENDENT_AMBULATORY_CARE_PROVIDER_SITE_OTHER): Payer: 59 | Admitting: Pulmonary Disease

## 2022-04-02 ENCOUNTER — Telehealth: Payer: Self-pay | Admitting: Pulmonary Disease

## 2022-04-02 VITALS — BP 120/80 | HR 68 | Ht 64.0 in | Wt 185.2 lb

## 2022-04-02 DIAGNOSIS — G4733 Obstructive sleep apnea (adult) (pediatric): Secondary | ICD-10-CM

## 2022-04-02 NOTE — Progress Notes (Signed)
Sabrina Swanson    [id number]    1960-07-24  Primary Care Physician:Lim, Barbera Setters, DO  Referring Physician: Wynelle Fanny, [address] [place],  [place] [address]  Chief complaint:   In for follow-up today for shortness of breath Shortness of breath developed postcardiac surgery  HPI:  Has been doing relatively well  Trying to use CPAP nightly but remains not very compliant Possible mask issues  Continues to use Trelegy, albuterol as needed  Denies any chest pains or chest discomfort  States she has been trying to stay active  Most recent PFT with obstructive lung disease with significant bronchodilator response  Denies significant shortness of breath at rest  Previous chest x-ray findings showing hypoventilation, multifocal infiltrate, elevated left hemidiaphragm which has progressively improved  Reformed smoker No pertinent occupational history  chronic obstructive pulmonary disease She was not having significant shortness of breath prior to diagnosis of coronary artery disease   Outpatient Encounter Medications as of 04/02/2022  Medication Sig   albuterol (VENTOLIN HFA) 108 (90 Base) MCG/ACT inhaler Inhale 2 puffs into the lungs every 4 (four) hours as needed for shortness of breath.   aspirin 81 MG EC tablet Take 81 mg by mouth daily.   atorvastatin (LIPITOR) 80 MG tablet Take 40 mg by mouth daily.   carvedilol (COREG) 25 MG tablet Take 1 tablet (25 mg total) by mouth 2 (two) times daily with a meal.   clopidogrel (PLAVIX) 75 MG tablet Take 1 tablet (75 mg total) by mouth daily.   dapagliflozin propanediol (FARXIGA) 10 MG TABS tablet Take 1 tablet (10 mg total) by mouth daily before breakfast.   ergocalciferol (VITAMIN D2) 1.25 MG (50000 UT) capsule Take 50,000 Units by mouth every 7 (seven) days.   fluconazole (DIFLUCAN) 150 MG tablet Take 150 mg by mouth daily.   fluticasone furoate-vilanterol (BREO ELLIPTA) 100-25 MCG/ACT AEPB Inhale 1 puff into  the lungs daily.   Fluticasone-Umeclidin-Vilant (TRELEGY ELLIPTA) 100-62.5-25 MCG/ACT AEPB Inhale 1 puff into the lungs daily.   Fluticasone-Umeclidin-Vilant (TRELEGY ELLIPTA) 100-62.5-25 MCG/ACT AEPB Inhale 100 mcg into the lungs daily.   Ipratropium-Albuterol (COMBIVENT) 20-100 MCG/ACT AERS respimat Inhale 1 puff into the lungs daily in the afternoon.   levofloxacin (LEVAQUIN) 500 MG tablet Take 500 mg by mouth daily.   losartan (COZAAR) 50 MG tablet Take 50 mg by mouth daily.   metFORMIN (GLUCOPHAGE-XR) 500 MG 24 hr tablet Take 500 mg by mouth daily.   methocarbamol (ROBAXIN) 750 MG tablet Take 1 tablet (750 mg total) by mouth 3 (three) times daily as needed (muscle spasm/pain).   NARCAN 4 MG/0.1ML LIQD nasal spray kit Place 4 mg into the nose once.   nitroGLYCERIN (NITROSTAT) 0.4 MG SL tablet Place 0.4 mg under the tongue every 5 (five) minutes as needed for chest pain.   Omega-3 Fatty Acids (FISH OIL) 1000 MG CAPS Take 1,000 mg by mouth daily.   oxyCODONE-acetaminophen (PERCOCET) 10-325 MG tablet Take 1 tablet by mouth every 8 (eight) hours.   vitamin B-12 (CYANOCOBALAMIN) 1000 MCG tablet Take 1,000 mcg by mouth daily.   No facility-administered encounter medications on file as of 04/02/2022.    Allergies as of 04/02/2022 - Review Complete 02/25/2022  Allergen Reaction Noted   Acetaminophen Itching and Nausea Only 07/13/2016    Past Medical History:  Diagnosis Date   Abnormal stress test 04/16/2020   Acquired trigger finger 06/10/2012   Alopecia 05/16/2013   Angina pectoris ([place])  11/13/2019   Anxiety state 06/10/2012   Arthritis of right acromioclavicular joint 11/27/2018   Back pain    Benign neoplasm of colon 06/10/2012   Bursitis disorder 05/11/2011   CAD (coronary artery disease), native coronary artery 04/22/2020   Cardiac murmur 11/13/2019   Carpal tunnel syndrome 06/10/2012   Formatting of this note might be different from the original. 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   DDD (degenerative disc disease), lumbosacral 06/10/2012   Depressive disorder 06/10/2012   Diabetes mellitus due to underlying condition with hyperosmolarity without coma, without long-term current use of insulin ([place]) 04/16/2020   Diabetes mellitus without complication ([place])    Dyspareunia 03/04/2014   Dysthymia 06/10/2012   Encounter for long-term (current) use of other medications 06/10/2012   Essential hypertension 08/14/2019   Former smoker 05/21/2020   Generalized anxiety disorder 06/10/2012   Hammer toe of right foot 10/23/2014   Formatting of this note might be different from the original. Second and third tarsals Formatting of this note might be different from the original. Formatting of this note might be different from the original. Second and third tarsals   High cholesterol    Hypercholesterolemia 03/06/2013   Hypertension    Insomnia 06/10/2012   Lumbosacral spondylosis 06/10/2012   Lump of skin 10/23/2014   Formatting of this note might be different from the original. Right breast-manipulated by patient prior to clinical exam   Migraine syndrome 06/10/2012   Mixed hyperlipidemia 04/16/2020   Moderate obstructive sleep apnea 07/16/2021   Numbness of toes 10/23/2014   Osteoarthrosis, generalized, involving multiple sites 06/10/2012   Pain in soft tissues of limb 06/10/2012   Formatting of this note might be different from the original. 03/09/11 right arm.   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   Sensorineural hearing loss 06/10/2012   Shoulder impingement, right 11/27/2018   Snoring 03/19/2021   Subacute vaginitis 07/18/2015    Tendinopathy of rotator cuff, right 11/27/2018   Tinnitus 03/21/2013   Tobacco abuse 04/16/2020   Tobacco use disorder 03/06/2013   Type 2 diabetes, controlled, with neuropathy ([place]) 10/21/2014   Unspecified cataract 06/30/2015   Vertigo 06/14/2013   Vestibular dizziness 06/10/2012    Past Surgical History:  Procedure Laterality Date   ABDOMINAL HYSTERECTOMY     CESAREAN SECTION     CORONARY ARTERY BYPASS GRAFT N/A 04/23/2020   Procedure: CORONARY ARTERY BYPASS GRAFTING (CABG), ON PUMP, TIMES THREE, USING LEFT INTERNAL MAMMARY ARTERY AND ENDOSCOPICALLY HARVESTED RIGHT GREATER SAPHENOUS VEIN;  Surgeon: [name], MD;  Location: [facility];  Service: Open Heart Surgery;  Laterality: N/A;   LEFT HEART CATH AND CORONARY ANGIOGRAPHY N/A 04/22/2020   Procedure: LEFT HEART CATH AND CORONARY ANGIOGRAPHY;  Surgeon: Troy Sine, MD;  Location: [facility];  Service: Cardiovascular;  Laterality: N/A;   TEE WITHOUT CARDIOVERSION N/A 04/23/2020   Procedure: TRANSESOPHAGEAL ECHOCARDIOGRAM (TEE);  Surgeon: [name], MD;  Location: [facility];  Service: Open Heart Surgery;  Laterality: N/A;    Family History  Problem Relation Age of Onset   Stomach cancer Mother    Hypertension Maternal  Grandmother    Diabetes Maternal Grandfather     Social History   Socioeconomic History   Marital status: Single    Spouse name: Not on file   Number of children: Not on file   Years of education: Not on file   Highest education level: Not on file  Occupational History   Not on file  Tobacco Use   Smoking status: Former    Packs/day: 1    Types: Cigarettes    Quit date: 04/22/2020    Years since quitting: 1.9    Passive exposure: Past   Smokeless tobacco: Never  Vaping Use   Vaping Use: Never used  Substance and Sexual Activity   Alcohol use: Yes    Comment: occasional   Drug use: No    Comment: did eat marijuana brownie last week   Sexual activity: Not on file  Other Topics Concern   Not  on file  Social History Narrative   Not on file   Social Determinants of Health   Financial Resource Strain: Not on file  Food Insecurity: Not on file  Transportation Needs: Not on file  Physical Activity: Not on file  Stress: Not on file  Social Connections: Not on file  Intimate Partner Violence: Not on file    Review of Systems  Constitutional:  Negative for fatigue.  Respiratory:  Positive for shortness of breath. Negative for chest tightness.     Vitals:   04/02/22 1550  BP: 120/80  Pulse: 68  SpO2: 98%     Physical Exam Constitutional:      Appearance: She is obese.  HENT:     Head: Normocephalic.     Nose: No congestion.     Mouth/Throat:     Mouth: Mucous membranes are moist.  Eyes:     General: No scleral icterus. Cardiovascular:     Rate and Rhythm: Normal rate and regular rhythm.     Heart sounds: No murmur heard.    No friction rub.  Pulmonary:     Effort: No respiratory distress.     Breath sounds: No stridor. No wheezing or rhonchi.  Musculoskeletal:     Cervical back: No rigidity or tenderness.  Neurological:     General: No focal deficit present.     Mental Status: She is alert and oriented to person, place, and time.  Psychiatric:        Mood and Affect: Mood normal.    CPAP compliance showing 21% compliance AutoSet of 5-15 AHI of 1.1 Average use on days used of 5 hours 4 minutes  Data Reviewed:  CT scan of the chest reviewed-on 04/28/2020  Pulmonary function test reviewed and does reveal obstructive disease Severe obstructive disease with significant bronchodilator response  Assessment:  Shortness of breath on exertion -Symptoms remain about the same -Importance of exercises discussed again today  Severe obstructive lung disease -Continue Trelegy -Albuterol as needed  Bronchitis/COPD -Continue Trelegy  History of coronary artery disease Diastolic heart failure Diaphragmatic dysfunction  Moderate obstructive sleep  apnea -On auto CPAP 5-15 -Importance of compliance discussed with patient during the visit today  Plan: .  Encouraged to continue exercises on a regular basis  .  Encouraged to continue using Trelegy  .  Oxygen supplementation as needed  .  DME referral for CPAP supplies  .  Follow-up in 6 months   Sherrilyn Rist MD [facility] 04/02/2022, 4:18 PM  CC: Wynelle Fanny, DO

## 2022-04-02 NOTE — Telephone Encounter (Signed)
Pt wants to know how she gets the o2 into the cpap when she travels. Says she can't take the big tank.

## 2022-04-02 NOTE — Patient Instructions (Addendum)
DME referral for CPAP supplies-new mask  Try and use your CPAP every night  Continue using Trelegy on a daily basis  Rescue inhaler use up to 4 times a day as needed  Will see you back in about 6 months

## 2022-04-05 NOTE — Telephone Encounter (Signed)
Spoke with patient she is wanting to get a different mask. Dr. Jenetta Downer is it okay to place the order for a new mask?

## 2022-04-11 NOTE — Telephone Encounter (Signed)
Yes, kindly place order for CPAP supplies, new mask

## 2022-04-12 ENCOUNTER — Other Ambulatory Visit: Payer: Self-pay

## 2022-04-12 DIAGNOSIS — G4733 Obstructive sleep apnea (adult) (pediatric): Secondary | ICD-10-CM

## 2022-04-12 NOTE — Telephone Encounter (Signed)
Order for new mask and supplies have been placed. Pt is aware. NFN

## 2022-05-06 ENCOUNTER — Emergency Department (HOSPITAL_BASED_OUTPATIENT_CLINIC_OR_DEPARTMENT_OTHER)
Admission: EM | Admit: 2022-05-06 | Discharge: 2022-05-06 | Disposition: A | Discharge status: Home / Self Care | Payer: 59 | Attending: Emergency Medicine | Admitting: Emergency Medicine

## 2022-05-06 ENCOUNTER — Encounter (HOSPITAL_BASED_OUTPATIENT_CLINIC_OR_DEPARTMENT_OTHER): Payer: Self-pay

## 2022-05-06 ENCOUNTER — Emergency Department (HOSPITAL_BASED_OUTPATIENT_CLINIC_OR_DEPARTMENT_OTHER): Payer: 59

## 2022-05-06 ENCOUNTER — Other Ambulatory Visit: Payer: Self-pay

## 2022-05-06 DIAGNOSIS — Z87891 Personal history of nicotine dependence: Secondary | ICD-10-CM | POA: Insufficient documentation

## 2022-05-06 DIAGNOSIS — Z7902 Long term (current) use of antithrombotics/antiplatelets: Secondary | ICD-10-CM | POA: Diagnosis not present

## 2022-05-06 DIAGNOSIS — J449 Chronic obstructive pulmonary disease, unspecified: Secondary | ICD-10-CM | POA: Diagnosis not present

## 2022-05-06 DIAGNOSIS — Z951 Presence of aortocoronary bypass graft: Secondary | ICD-10-CM | POA: Diagnosis not present

## 2022-05-06 DIAGNOSIS — I10 Essential (primary) hypertension: Secondary | ICD-10-CM | POA: Insufficient documentation

## 2022-05-06 DIAGNOSIS — Z7982 Long term (current) use of aspirin: Secondary | ICD-10-CM | POA: Diagnosis not present

## 2022-05-06 DIAGNOSIS — I2510 Atherosclerotic heart disease of native coronary artery without angina pectoris: Secondary | ICD-10-CM | POA: Insufficient documentation

## 2022-05-06 DIAGNOSIS — M79604 Pain in right leg: Secondary | ICD-10-CM | POA: Diagnosis present

## 2022-05-06 DIAGNOSIS — M79651 Pain in right thigh: Secondary | ICD-10-CM | POA: Insufficient documentation

## 2022-05-06 DIAGNOSIS — E119 Type 2 diabetes mellitus without complications: Secondary | ICD-10-CM | POA: Insufficient documentation

## 2022-05-06 MED ORDER — DICLOFENAC SODIUM 1 % EX GEL: 2.0000 g | Freq: Four times a day (QID) | CUTANEOUS | 0 refills | Status: DC | PRN

## 2022-05-06 NOTE — ED Provider Notes (Signed)
Emergency Department Provider Note   I have reviewed the triage vital signs and the nursing notes.   HISTORY  Chief Complaint Leg Pain (R)   HPI Sabrina Swanson is a 62 y.o. female past history reviewed below presents to the emergency department valuation of right leg/thigh pain.  Pain began last night suddenly while in bed.  Patient initially thought this was a cramp and took some mustard without relief.  No pain into the back.  No injury or falls.  No fevers or chills.  She is now having pain along her right hamstring.  She is on aspirin and Plavix but no anticoagulation. No leg swelling.   Past Medical History:  Diagnosis Date   Abnormal stress test 04/16/2020   Acquired trigger finger 06/10/2012   Alopecia 05/16/2013   Angina pectoris (HCC) 11/13/2019   Anxiety state 06/10/2012   Arthritis of right acromioclavicular joint 11/27/2018   Back pain    Benign neoplasm of colon 06/10/2012   Bursitis disorder 05/11/2011   CAD (coronary artery disease), native coronary artery 04/22/2020   Cardiac murmur 11/13/2019   Carpal tunnel syndrome 06/10/2012   Formatting of this note might be different from the original. 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   DDD (degenerative disc disease), lumbosacral 06/10/2012   Depressive disorder 06/10/2012   Diabetes mellitus due to underlying condition with hyperosmolarity without coma, without Renatta Shrieves-term current use of insulin (HCC) 04/16/2020   Diabetes mellitus without complication (HCC)    Dyspareunia 03/04/2014   Dysthymia 06/10/2012   Encounter for Tilly Pernice-term (current) use of other medications 06/10/2012   Essential hypertension 08/14/2019   Former smoker 05/21/2020   Generalized anxiety disorder 06/10/2012   Hammer toe of right foot 10/23/2014    Formatting of this note might be different from the original. Second and third tarsals Formatting of this note might be different from the original. Formatting of this note might be different from the original. Second and third tarsals   High cholesterol    Hypercholesterolemia 03/06/2013   Hypertension    Insomnia 06/10/2012   Lumbosacral spondylosis 06/10/2012   Lump of skin 10/23/2014   Formatting of this note might be different from the original. Right breast-manipulated by patient prior to clinical exam   Migraine syndrome 06/10/2012   Mixed hyperlipidemia 04/16/2020   Moderate obstructive sleep apnea 07/16/2021   Numbness of toes 10/23/2014   Osteoarthrosis, generalized, involving multiple sites 06/10/2012   Pain in soft tissues of limb 06/10/2012   Formatting of this note might be different from the original. 03/09/11 right arm.   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 Respiratory: Denies shortness of breath. Gastrointestinal:  No abdominal pain.  Musculoskeletal: Positive left posterior thigh pain. Negative back pain.  Skin: Negative for rash. Neurological: Negative for headaches. No numbness/weakness.   ____________________________________________   PHYSICAL EXAM:  VITAL SIGNS: ED Triage Vitals  Enc Vitals Group     BP 05/06/22  1655 (!) 155/84     Pulse Rate 05/06/22 1655 66     Resp 05/06/22 1655 20     Temp 05/06/22 1655 97.6 F (36.4 C)     Temp Source 05/06/22 1655 Oral     SpO2 05/06/22 1655 100 %   Constitutional: Alert and oriented. Well appearing and in no acute distress. Eyes: Conjunctivae are normal.  Head: Atraumatic. Nose: No congestion/rhinnorhea. Mouth/Throat: Mucous membranes are moist. Neck: No stridor.  Cardiovascular: Normal rate, regular rhythm. Good peripheral circulation. Grossly normal heart sounds.   Respiratory: Normal respiratory effort.  No retractions. Lungs CTAB. Gastrointestinal: Soft and nontender. No distention.  Musculoskeletal: No appreciable thigh swelling or rash. Normal ROM of the right hip and knee. Mild tenderness along the hamstring.  Neurologic:  Normal speech and language. No gross focal neurologic deficits are appreciated.  Skin:  Skin is warm, dry and intact. No rash noted.  ____________________________________________  RADIOLOGY  US Venous Img Lower Right (DVT Study)  Result Date: 05/06/2022 CLINICAL DATA:  Right hamstring pain EXAM: RIGHT LOWER EXTREMITY VENOUS DOPPLER ULTRASOUND TECHNIQUE: Gray-scale sonography with compression, as well as color and duplex ultrasound, were performed to evaluate the deep venous system(s) from the level of the common femoral vein through the popliteal and proximal calf veins. COMPARISON:  None Available. FINDINGS: VENOUS Normal compressibility of the common femoral, superficial femoral, and popliteal veins, as well as the visualized calf veins. Visualized portions of profunda femoral vein and great saphenous vein unremarkable. No filling defects to suggest DVT on grayscale or color Doppler imaging. Doppler waveforms show normal direction of venous flow, normal respiratory plasticity and response to augmentation. Limited views of the contralateral common femoral vein are unremarkable. OTHER None. Limitations: none IMPRESSION: Negative.  Electronically Signed   By: Darliss Cheney M.D.   On: 05/06/2022 18:33   DG Pelvis 1-2 Views  Result Date: 05/06/2022 CLINICAL DATA:  Pain after injury EXAM: PELVIS - 1 VIEW COMPARISON:  None Available. FINDINGS: No fracture or dislocation. Preserved joint spaces and bone mineralization. Hyperostosis. There are some rounded densities in the pelvis which are indeterminate although possibly vascular. IMPRESSION: No acute osseous abnormality Electronically Signed   By: Karen Kays M.D.   On: 05/06/2022 17:26    ____________________________________________   PROCEDURES  Procedure(s) performed:   Procedures  None  ____________________________________________   INITIAL IMPRESSION / ASSESSMENT AND PLAN / ED COURSE  Pertinent labs & imaging results that were available during my care of the patient were reviewed by me and considered in my medical decision making (see chart for details).   This patient is Presenting for Evaluation of leg pain, which does require a range of treatment options, and is a complaint that involves a moderate risk of morbidity and mortality.  The Differential Diagnoses include MSK strain, DVT, fracture, dislocation, compartment syndrome, etc.  Radiologic Tests Ordered, included pelvis XR and DVT US. I independently interpreted the images and agree with radiology interpretation.   Medical Decision Making: Summary:   Patient presents emergency department with right leg pain.  Mostly tenderness along the hamstring.  Preserved range of motion of the right hip.  No compartment syndrome.  Plan for DVT ultrasound.  X-ray of the hip  without bony abnormality.   Reevaluation with update and discussion with patient. No DVT on [place]. Plan for MSK pain mgmt and sports med follow up should symptoms worsen.   Patient's presentation is most consistent with acute presentation with potential threat to life or bodily function.   Disposition:  discharge  ____________________________________________  FINAL CLINICAL IMPRESSION(S) / ED DIAGNOSES  Final diagnoses:  Right leg pain    NEW OUTPATIENT MEDICATIONS STARTED DURING THIS VISIT:  Discharge Medication List as of 05/06/2022  6:50 PM     START taking these medications   Details  diclofenac Sodium (VOLTAREN) 1 % GEL Apply 2 g topically 4 (four) times daily as needed., Starting Thu 05/06/2022, Normal        Note:  This document was prepared using Dragon voice recognition software and may include unintentional dictation errors.  Alona Bene, MD, [facility]    Aditri Louischarles, Arlyss Repress, MD 05/06/22 2241

## 2022-05-06 NOTE — ED Triage Notes (Signed)
Pt c/o R thigh/ hamstring pain onset last night approx midnight. 10/10 "so sore, tender, I have to sit on a pillow." Denies fluid status/ known electrolyte issues, "it's just sore from my butt down."

## 2022-05-06 NOTE — Discharge Instructions (Signed)

## 2022-05-15 ENCOUNTER — Other Ambulatory Visit: Payer: Self-pay

## 2022-05-15 ENCOUNTER — Emergency Department (HOSPITAL_BASED_OUTPATIENT_CLINIC_OR_DEPARTMENT_OTHER)
Admission: EM | Admit: 2022-05-15 | Discharge: 2022-05-15 | Disposition: A | Discharge status: Home / Self Care | Payer: 59 | Attending: Emergency Medicine | Admitting: Emergency Medicine

## 2022-05-15 DIAGNOSIS — Z7982 Long term (current) use of aspirin: Secondary | ICD-10-CM | POA: Diagnosis not present

## 2022-05-15 DIAGNOSIS — Z7984 Long term (current) use of oral hypoglycemic drugs: Secondary | ICD-10-CM | POA: Insufficient documentation

## 2022-05-15 DIAGNOSIS — M79604 Pain in right leg: Secondary | ICD-10-CM

## 2022-05-15 DIAGNOSIS — J449 Chronic obstructive pulmonary disease, unspecified: Secondary | ICD-10-CM | POA: Insufficient documentation

## 2022-05-15 DIAGNOSIS — Z79899 Other long term (current) drug therapy: Secondary | ICD-10-CM | POA: Diagnosis not present

## 2022-05-15 DIAGNOSIS — E119 Type 2 diabetes mellitus without complications: Secondary | ICD-10-CM | POA: Diagnosis not present

## 2022-05-15 DIAGNOSIS — Z951 Presence of aortocoronary bypass graft: Secondary | ICD-10-CM | POA: Insufficient documentation

## 2022-05-15 DIAGNOSIS — Z7902 Long term (current) use of antithrombotics/antiplatelets: Secondary | ICD-10-CM | POA: Diagnosis not present

## 2022-05-15 DIAGNOSIS — Z720 Tobacco use: Secondary | ICD-10-CM | POA: Diagnosis not present

## 2022-05-15 DIAGNOSIS — I10 Essential (primary) hypertension: Secondary | ICD-10-CM | POA: Diagnosis not present

## 2022-05-15 DIAGNOSIS — M79651 Pain in right thigh: Secondary | ICD-10-CM | POA: Insufficient documentation

## 2022-05-15 DIAGNOSIS — I2510 Atherosclerotic heart disease of native coronary artery without angina pectoris: Secondary | ICD-10-CM | POA: Diagnosis not present

## 2022-05-15 MED ORDER — KETOROLAC TROMETHAMINE 30 MG/ML IJ SOLN
30.0000 mg | Freq: Once | INTRAMUSCULAR | Status: AC
  Administered 2022-05-15: 30 mg via INTRAMUSCULAR
  Filled 2022-05-15: qty 1

## 2022-05-15 MED ORDER — DEXAMETHASONE SODIUM PHOSPHATE 10 MG/ML IJ SOLN
10.0000 mg | Freq: Once | INTRAMUSCULAR | Status: AC
  Administered 2022-05-15: 10 mg via INTRAMUSCULAR
  Filled 2022-05-15: qty 1

## 2022-05-15 NOTE — ED Notes (Signed)
Came here in early April. Prior EDP at [facility] said pulled muscle. No DVT, Xrays negative. Took muscle relaxant, walked, ice packs. Did not followup with PCP. Referred to Ortho specialist, did not set up appt.   Pain radiating from thigh down to leg, and most commonly from ankle up to buttocks of R leg.

## 2022-05-15 NOTE — ED Triage Notes (Signed)
Reports persistent right thigh pain , was seen and treated at the Er for same thing , had referral for specialist . Did not call for appointment yet  Drove for 2 hours and pain is worse .

## 2022-05-15 NOTE — ED Provider Notes (Signed)
[place] [facility] Provider Note   CSN: [id number] Arrival date & time: 05/15/22  1631     History {Add pertinent medical, surgical, social history, OB history to HPI:1} Chief Complaint  Patient presents with   Leg Pain    Sabrina Swanson is a 62 y.o. female with history of hyperlipidemia, diabetes, hypertension, migraines, COPD, degenerative disc disease, anxiety, CAD s/p CABG x 3, tobacco use who presents the emergency department complaining of persistent right thigh pain.  Patient states that she was seen in the ER previously and was told that she likely had a strained muscle.  She took muscle relaxers and used ice packs, was referred to an orthopedist to follow-up with but did not set up this appointment.  States that she drove for about 2 hours and symptoms were made worse.  Pain radiates from the buttocks to the R ankle. Maybe some intermittent numbness going down the right leg.  No symptoms in the left leg.  No saddle paresthesias, urinary tension, urine or bowel incontinence.   Leg Pain      Home Medications Prior to Admission medications   Medication Sig Start Date End Date Taking? Authorizing Provider  albuterol (VENTOLIN HFA) 108 (90 Base) MCG/ACT inhaler Inhale 2 puffs into the lungs every 4 (four) hours as needed for shortness of breath. 07/19/16   [provider]  aspirin 81 MG EC tablet Take 81 mg by mouth daily.    [provider]  atorvastatin (LIPITOR) 80 MG tablet Take 40 mg by mouth daily.    [provider]  carvedilol (COREG) 25 MG tablet Take 1 tablet (25 mg total) by mouth 2 (two) times daily with a meal. 05/01/20   Roddenberry, Cecille Amsterdam, PA-C  clopidogrel (PLAVIX) 75 MG tablet Take 1 tablet (75 mg total) by mouth daily. 03/05/22   Revankar, Aundra Dubin, MD  dapagliflozin propanediol (FARXIGA) 10 MG TABS tablet Take 1 tablet (10 mg total) by mouth daily before breakfast. 05/01/20   Leary Roca,  PA-C  diclofenac Sodium (VOLTAREN) 1 % GEL Apply 2 g topically 4 (four) times daily as needed. 05/06/22   Long, Arlyss Repress, MD  ergocalciferol (VITAMIN D2) 1.25 MG (50000 UT) capsule Take 50,000 Units by mouth every 7 (seven) days. 01/23/19   [provider]  fluconazole (DIFLUCAN) 150 MG tablet Take 150 mg by mouth daily. 10/29/21   [provider]  fluticasone furoate-vilanterol (BREO ELLIPTA) 100-25 MCG/ACT AEPB Inhale 1 puff into the lungs daily. 03/19/21   Cobb, Ruby Cola, NP  Fluticasone-Umeclidin-Vilant (TRELEGY ELLIPTA) 100-62.5-25 MCG/ACT AEPB Inhale 1 puff into the lungs daily. 07/16/21   Cobb, Ruby Cola, NP  Fluticasone-Umeclidin-Vilant (TRELEGY ELLIPTA) 100-62.5-25 MCG/ACT AEPB Inhale 100 mcg into the lungs daily. 07/16/21   Cobb, Ruby Cola, NP  Ipratropium-Albuterol (COMBIVENT) 20-100 MCG/ACT AERS respimat Inhale 1 puff into the lungs daily in the afternoon. 07/19/16   [provider]  levofloxacin (LEVAQUIN) 500 MG tablet Take 500 mg by mouth daily. 05/06/20   [provider]  losartan (COZAAR) 50 MG tablet Take 50 mg by mouth daily. 08/13/21   [provider]  metFORMIN (GLUCOPHAGE-XR) 500 MG 24 hr tablet Take 500 mg by mouth daily. 10/17/19   [provider]  methocarbamol (ROBAXIN) 750 MG tablet Take 1 tablet (750 mg total) by mouth 3 (three) times daily as needed (muscle spasm/pain). 02/25/22   Cathren Laine, MD  NARCAN 4 MG/0.1ML LIQD nasal spray kit Place 4 mg into  the nose once. 11/22/19   [provider]  nitroGLYCERIN (NITROSTAT) 0.4 MG SL tablet Place 0.4 mg under the tongue every 5 (five) minutes as needed for chest pain.    [provider]  Omega-3 Fatty Acids (FISH OIL) 1000 MG CAPS Take 1,000 mg by mouth daily.    [provider]  oxyCODONE-acetaminophen (PERCOCET) 10-325 MG tablet Take 1 tablet by mouth every 8 (eight) hours. 05/17/20   [provider]  vitamin B-12 (CYANOCOBALAMIN) 1000 MCG  tablet Take 1,000 mcg by mouth daily.    [provider]      Allergies    Acetaminophen    Review of Systems   Review of Systems  Musculoskeletal:  Positive for arthralgias and myalgias.  All other systems reviewed and are negative.   Physical Exam Updated Vital Signs BP 121/85 (BP Location: Right Arm)   Pulse 85   Temp 97.7 F (36.5 C) (Oral)   Resp 20   Wt 81.6 kg   SpO2 96%   BMI 30.90 kg/m  Physical Exam  ED Results / Procedures / Treatments   Labs (all labs ordered are listed, but only abnormal results are displayed) Labs Reviewed - No data to display  EKG None  Radiology No results found.  Procedures Procedures  {Document cardiac monitor, telemetry assessment procedure when appropriate:1}  Medications Ordered in ED Medications - No data to display  ED Course/ Medical Decision Making/ A&P   {   Click here for ABCD2, HEART and other calculatorsREFRESH Note before signing :1}                          Medical Decision Making  ***  {Document critical care time when appropriate:1} {Document review of labs and clinical decision tools ie heart score, Chads2Vasc2 etc:1}  {Document your independent review of radiology images, and any outside records:1} {Document your discussion with family members, caretakers, and with consultants:1} {Document social determinants of health affecting pt's care:1} {Document your decision making why or why not admission, treatments were needed:1} Final Clinical Impression(s) / ED Diagnoses Final diagnoses:  None    Rx / DC Orders ED Discharge Orders     None

## 2022-05-15 NOTE — Discharge Instructions (Addendum)
You are seen emergency department today for right leg pain.  As we discussed I think that your pain is likely coming from your lower back. I have attached some information about this.  We gave you steroids and anti-inflammatories. Continue taking either ibuprofen or tylenol.  Please follow up with the orthopedist for ongoing symptoms.
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
## 2022-06-03 ENCOUNTER — Other Ambulatory Visit: Payer: Self-pay | Admitting: Cardiology

## 2022-06-14 ENCOUNTER — Other Ambulatory Visit: Payer: Self-pay

## 2022-06-14 ENCOUNTER — Emergency Department (HOSPITAL_BASED_OUTPATIENT_CLINIC_OR_DEPARTMENT_OTHER)
Admission: EM | Admit: 2022-06-14 | Discharge: 2022-06-14 | Disposition: A | Discharge status: Home / Self Care | Payer: 59 | Attending: Emergency Medicine | Admitting: Emergency Medicine

## 2022-06-14 ENCOUNTER — Encounter (HOSPITAL_BASED_OUTPATIENT_CLINIC_OR_DEPARTMENT_OTHER): Payer: Self-pay | Admitting: Emergency Medicine

## 2022-06-14 DIAGNOSIS — Z7982 Long term (current) use of aspirin: Secondary | ICD-10-CM | POA: Diagnosis not present

## 2022-06-14 DIAGNOSIS — Y92410 Unspecified street and highway as the place of occurrence of the external cause: Secondary | ICD-10-CM | POA: Diagnosis not present

## 2022-06-14 DIAGNOSIS — M542 Cervicalgia: Secondary | ICD-10-CM | POA: Insufficient documentation

## 2022-06-14 DIAGNOSIS — M7918 Myalgia, other site: Secondary | ICD-10-CM

## 2022-06-14 DIAGNOSIS — Z7902 Long term (current) use of antithrombotics/antiplatelets: Secondary | ICD-10-CM | POA: Diagnosis not present

## 2022-06-14 DIAGNOSIS — Z79899 Other long term (current) drug therapy: Secondary | ICD-10-CM | POA: Insufficient documentation

## 2022-06-14 MED ORDER — CYCLOBENZAPRINE HCL 10 MG PO TABS: 10.0000 mg | ORAL_TABLET | Freq: Two times a day (BID) | ORAL | 0 refills | Status: AC | PRN

## 2022-06-14 MED ORDER — NAPROXEN 500 MG PO TABS: 500.0000 mg | ORAL_TABLET | Freq: Two times a day (BID) | ORAL | 0 refills | Status: AC
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
## 2022-06-14 NOTE — Discharge Instructions (Signed)
Take Flexeril and Naproxen as needed as directed.  Do NOT drive if taking Flexeril. Apply a warm compress to sore muscles for 20 minutes at a time. Follow with gentle stretching. Recheck with your PCP if not improving.

## 2022-06-21 DIAGNOSIS — G8929 Other chronic pain: Secondary | ICD-10-CM

## 2022-06-21 DIAGNOSIS — R262 Difficulty in walking, not elsewhere classified: Secondary | ICD-10-CM | POA: Insufficient documentation

## 2022-06-21 DIAGNOSIS — Z7409 Other reduced mobility: Secondary | ICD-10-CM | POA: Insufficient documentation

## 2022-06-21 DIAGNOSIS — Z789 Other specified health status: Secondary | ICD-10-CM

## 2022-06-21 HISTORY — DX: Other specified health status: Z78.9

## 2022-06-21 HISTORY — DX: Other chronic pain: G89.29

## 2022-06-21 HISTORY — DX: Difficulty in walking, not elsewhere classified: R26.2

## 2022-08-04 ENCOUNTER — Other Ambulatory Visit: Payer: Self-pay

## 2022-08-04 ENCOUNTER — Encounter (HOSPITAL_BASED_OUTPATIENT_CLINIC_OR_DEPARTMENT_OTHER): Payer: Self-pay

## 2022-08-04 ENCOUNTER — Emergency Department (HOSPITAL_BASED_OUTPATIENT_CLINIC_OR_DEPARTMENT_OTHER)
Admission: EM | Admit: 2022-08-04 | Discharge: 2022-08-04 | Disposition: A | Discharge status: Home / Self Care | Payer: 59 | Attending: Emergency Medicine | Admitting: Emergency Medicine

## 2022-08-04 ENCOUNTER — Emergency Department (HOSPITAL_BASED_OUTPATIENT_CLINIC_OR_DEPARTMENT_OTHER): Payer: 59

## 2022-08-04 DIAGNOSIS — R3915 Urgency of urination: Secondary | ICD-10-CM | POA: Insufficient documentation

## 2022-08-04 DIAGNOSIS — R350 Frequency of micturition: Secondary | ICD-10-CM | POA: Diagnosis present

## 2022-08-04 DIAGNOSIS — I10 Essential (primary) hypertension: Secondary | ICD-10-CM | POA: Diagnosis not present

## 2022-08-04 DIAGNOSIS — I2510 Atherosclerotic heart disease of native coronary artery without angina pectoris: Secondary | ICD-10-CM | POA: Diagnosis not present

## 2022-08-04 DIAGNOSIS — Z79899 Other long term (current) drug therapy: Secondary | ICD-10-CM | POA: Diagnosis not present

## 2022-08-04 DIAGNOSIS — Z951 Presence of aortocoronary bypass graft: Secondary | ICD-10-CM | POA: Insufficient documentation

## 2022-08-04 DIAGNOSIS — Z7984 Long term (current) use of oral hypoglycemic drugs: Secondary | ICD-10-CM | POA: Diagnosis not present

## 2022-08-04 DIAGNOSIS — E119 Type 2 diabetes mellitus without complications: Secondary | ICD-10-CM | POA: Insufficient documentation

## 2022-08-04 DIAGNOSIS — J449 Chronic obstructive pulmonary disease, unspecified: Secondary | ICD-10-CM | POA: Insufficient documentation

## 2022-08-04 DIAGNOSIS — R1031 Right lower quadrant pain: Secondary | ICD-10-CM | POA: Insufficient documentation

## 2022-08-04 DIAGNOSIS — Z7902 Long term (current) use of antithrombotics/antiplatelets: Secondary | ICD-10-CM | POA: Diagnosis not present

## 2022-08-04 DIAGNOSIS — Z7951 Long term (current) use of inhaled steroids: Secondary | ICD-10-CM | POA: Insufficient documentation

## 2022-08-04 DIAGNOSIS — N3000 Acute cystitis without hematuria: Secondary | ICD-10-CM

## 2022-08-04 DIAGNOSIS — Z7982 Long term (current) use of aspirin: Secondary | ICD-10-CM | POA: Insufficient documentation

## 2022-08-04 LAB — URINALYSIS, ROUTINE W REFLEX MICROSCOPIC
Bilirubin Urine: NEGATIVE
Glucose, UA: >=500 mg/dL — AB
Ketones, ur: NEGATIVE mg/dL
Nitrite: NEGATIVE
Protein, ur: 30 mg/dL — AB
Specific Gravity, Urine: 1.02 (ref 1.005–1.030)
pH: 5.5 (ref 5.0–8.0)

## 2022-08-04 LAB — COMPREHENSIVE METABOLIC PANEL
ALT: 25 U/L (ref 0–44)
AST: 18 U/L (ref 15–41)
Albumin: 3.8 g/dL (ref 3.5–5.0)
Alkaline Phosphatase: 101 U/L (ref 38–126)
Anion gap: 8 (ref 5–15)
BUN: 16 mg/dL (ref 8–23)
CO2: 23 mmol/L (ref 22–32)
Calcium: 9.1 mg/dL (ref 8.9–10.3)
Chloride: 107 mmol/L (ref 98–111)
Creatinine, Ser: 0.94 mg/dL (ref 0.44–1.00)
GFR, Estimated: >60 mL/min (ref >60)
Glucose, Bld: 163 mg/dL — ABNORMAL HIGH (ref 70–99)
Potassium: 4.1 mmol/L (ref 3.5–5.1)
Sodium: 138 mmol/L (ref 135–145)
Total Bilirubin: 0.5 mg/dL (ref 0.3–1.2)
Total Protein: 7 g/dL (ref 6.5–8.1)

## 2022-08-04 LAB — CBC WITH DIFFERENTIAL/PLATELET
Abs Immature Granulocytes: 0.04 10*3/uL (ref 0.00–0.07)
Basophils Absolute: 0 10*3/uL (ref 0.0–0.1)
Basophils Relative: 0 %
Eosinophils Absolute: 0 10*3/uL (ref 0.0–0.5)
Eosinophils Relative: 0 %
HCT: 41.5 % (ref 36.0–46.0)
Hemoglobin: 13.5 g/dL (ref 12.0–15.0)
Immature Granulocytes: 1 %
Lymphocytes Relative: 30 %
Lymphs Abs: 2.6 10*3/uL (ref 0.7–4.0)
MCH: 30 pg (ref 26.0–34.0)
MCHC: 32.5 g/dL (ref 30.0–36.0)
MCV: 92.2 fL (ref 80.0–100.0)
Monocytes Absolute: 0.7 10*3/uL (ref 0.1–1.0)
Monocytes Relative: 8 %
Neutro Abs: 5.3 10*3/uL (ref 1.7–7.7)
Neutrophils Relative %: 61 %
Platelets: 224 10*3/uL (ref 150–400)
RBC: 4.5 MIL/uL (ref 3.87–5.11)
RDW: 15.6 % — ABNORMAL HIGH (ref 11.5–15.5)
WBC: 8.7 10*3/uL (ref 4.0–10.5)
nRBC: 0 % (ref 0.0–0.2)

## 2022-08-04 LAB — URINALYSIS, MICROSCOPIC (REFLEX): WBC, UA: >50 WBC/hpf (ref 0–5)

## 2022-08-04 LAB — LIPASE, BLOOD: Lipase: 61 U/L — ABNORMAL HIGH (ref 11–51)

## 2022-08-04 MED ORDER — PHENAZOPYRIDINE HCL 100 MG PO TABS: 100.0000 mg | ORAL_TABLET | Freq: Three times a day (TID) | ORAL | 0 refills | Status: AC | PRN

## 2022-08-04 MED ORDER — KETOROLAC TROMETHAMINE 30 MG/ML IJ SOLN
15.0000 mg | Freq: Once | INTRAMUSCULAR | Status: AC
  Administered 2022-08-04: 15 mg via INTRAVENOUS
  Filled 2022-08-04: qty 1

## 2022-08-04 MED ORDER — SODIUM CHLORIDE 0.9 % IV SOLN
1.0000 g | Freq: Once | INTRAVENOUS | Status: AC
  Administered 2022-08-04: 1 g via INTRAVENOUS
  Filled 2022-08-04: qty 10

## 2022-08-04 MED ORDER — CEPHALEXIN 500 MG PO CAPS: 500.0000 mg | ORAL_CAPSULE | Freq: Three times a day (TID) | ORAL | 0 refills | Status: DC

## 2022-08-04 MED ORDER — ONDANSETRON HCL 4 MG/2ML IJ SOLN
4.0000 mg | Freq: Once | INTRAMUSCULAR | Status: AC
  Administered 2022-08-04: 4 mg via INTRAVENOUS
  Filled 2022-08-04: qty 2

## 2022-08-04 MED ORDER — IOHEXOL 300 MG/ML  SOLN
100.0000 mL | Freq: Once | INTRAMUSCULAR | Status: AC | PRN
  Administered 2022-08-04: 100 mL via INTRAVENOUS

## 2022-08-04 NOTE — ED Provider Notes (Signed)
[facility] Provider Note   CSN: [id number] Arrival date & time: 08/04/22  0403     History  Chief Complaint  Patient presents with   Urinary Frequency   Pelvic Pain    Sabrina Swanson is a 62 y.o. female.  Patient with a history of CAD status post CABG, diabetes, COPD, hypertension presents with urinary frequency, urgency and lower abdominal pain for the past 3 days.  States her urine is "warm" and she has discomfort with urination and frequency of small amounts in big amounts.  This feels similar to previous UTIs.  Having right-sided lower abdominal pain has been constant for the past 3 days as well.  No flank pain or back pain out of her ordinary.  No history of kidney stones.  No fever.  No nausea or vomiting.  No constipation or diarrhea.  No chest pain or shortness of breath.  Previous hysterectomy.  Still has ovary and appendix.  The history is provided by the patient.  Urinary Frequency Associated symptoms include abdominal pain. Pertinent negatives include no chest pain, no headaches and no shortness of breath.  Pelvic Pain Associated symptoms include abdominal pain. Pertinent negatives include no chest pain, no headaches and no shortness of breath.       Home Medications Prior to Admission medications   Medication Sig Start Date End Date Taking? Authorizing Provider  albuterol (VENTOLIN HFA) 108 (90 Base) MCG/ACT inhaler Inhale 2 puffs into the lungs every 4 (four) hours as needed for shortness of breath. 07/19/16   [provider]  aspirin 81 MG EC tablet Take 81 mg by mouth daily.    [provider]  atorvastatin (LIPITOR) 80 MG tablet Take 40 mg by mouth daily.    [provider]  carvedilol (COREG) 25 MG tablet Take 1 tablet (25 mg total) by mouth 2 (two) times daily with a meal. 05/01/20   Roddenberry, Cecille Amsterdam, PA-C  clopidogrel (PLAVIX) 75 MG tablet Take 1 tablet (75 mg total) by mouth daily.  06/03/22   Revankar, Aundra Dubin, MD  cyclobenzaprine (FLEXERIL) 10 MG tablet Take 1 tablet (10 mg total) by mouth 2 (two) times daily as needed for muscle spasms. 06/14/22   Jeannie Fend, PA-C  dapagliflozin propanediol (FARXIGA) 10 MG TABS tablet Take 1 tablet (10 mg total) by mouth daily before breakfast. 05/01/20   Roddenberry, Cecille Amsterdam, PA-C  ergocalciferol (VITAMIN D2) 1.25 MG (50000 UT) capsule Take 50,000 Units by mouth every 7 (seven) days. 01/23/19   [provider]  fluconazole (DIFLUCAN) 150 MG tablet Take 150 mg by mouth daily. 10/29/21   [provider]  fluticasone furoate-vilanterol (BREO ELLIPTA) 100-25 MCG/ACT AEPB Inhale 1 puff into the lungs daily. 03/19/21   Cobb, Ruby Cola, NP  Fluticasone-Umeclidin-Vilant (TRELEGY ELLIPTA) 100-62.5-25 MCG/ACT AEPB Inhale 1 puff into the lungs daily. 07/16/21   Cobb, Ruby Cola, NP  Fluticasone-Umeclidin-Vilant (TRELEGY ELLIPTA) 100-62.5-25 MCG/ACT AEPB Inhale 100 mcg into the lungs daily. 07/16/21   Cobb, Ruby Cola, NP  Ipratropium-Albuterol (COMBIVENT) 20-100 MCG/ACT AERS respimat Inhale 1 puff into the lungs daily in the afternoon. 07/19/16   [provider]  levofloxacin (LEVAQUIN) 500 MG tablet Take 500 mg by mouth daily. 05/06/20   [provider]  losartan (COZAAR) 50 MG tablet Take 50 mg by mouth daily. 08/13/21   [provider]  metFORMIN (GLUCOPHAGE-XR) 500 MG 24 hr tablet Take 500 mg by mouth daily. 10/17/19   [provider]  naproxen (NAPROSYN) 500 MG tablet Take 1 tablet (500 mg total) by mouth 2 (two) times daily. 06/14/22   Jeannie Fend, PA-C  NARCAN 4 MG/0.1ML LIQD nasal spray kit Place 4 mg into the nose once. 11/22/19   [provider]  nitroGLYCERIN (NITROSTAT) 0.4 MG SL tablet Place 0.4 mg under the tongue every 5 (five) minutes as needed for chest pain.    [provider]  Omega-3 Fatty Acids (FISH OIL) 1000 MG CAPS Take 1,000 mg by mouth daily.    [provider]  oxyCODONE-acetaminophen (PERCOCET) 10-325 MG tablet Take 1 tablet by mouth every 8 (eight) hours. 05/17/20   [provider]  vitamin B-12 (CYANOCOBALAMIN) 1000 MCG tablet Take 1,000 mcg by mouth daily.    [provider]      Allergies    Acetaminophen    Review of Systems   Review of Systems  Constitutional:  Negative for activity change, appetite change and fever.  HENT:  Negative for congestion and rhinorrhea.   Respiratory:  Negative for cough, chest tightness and shortness of breath.   Cardiovascular:  Negative for chest pain.  Gastrointestinal:  Positive for abdominal pain. Negative for nausea and vomiting.  Genitourinary:  Positive for dysuria, frequency and pelvic pain. Negative for hematuria.  Musculoskeletal:  Negative for arthralgias, back pain and myalgias.  Skin:  Negative for rash.  Neurological:  Negative for weakness and headaches.   all other systems are negative except as noted in the HPI and PMH.    Physical Exam Updated Vital Signs BP 135/80 (BP Location: Right Arm)   Pulse 70   Temp 98.3 F (36.8 C) (Oral)   Resp 18   Ht 5\' 2"  (1.575 m)   Wt 81.6 kg   SpO2 98%   BMI 32.90 kg/m  Physical Exam Vitals and nursing note reviewed.  Constitutional:      General: She is not in acute distress.    Appearance: She is well-developed.  HENT:     Head: Normocephalic and atraumatic.     Mouth/Throat:     Pharynx: No oropharyngeal exudate.  Eyes:     Conjunctiva/sclera: Conjunctivae normal.     Pupils: Pupils are equal, round, and reactive to light.  Neck:     Comments: No meningismus. Cardiovascular:     Rate and Rhythm: Normal rate and regular rhythm.     Heart sounds: Normal heart sounds. No murmur heard. Pulmonary:     Effort: Pulmonary effort is normal. No respiratory distress.     Breath sounds: Normal breath sounds.  Abdominal:     Palpations: Abdomen is soft.     Tenderness: There is abdominal tenderness. There is  guarding. There is no rebound.     Comments: Periumbilical and right lower quadrant tenderness, voluntary guarding, no rebound  Musculoskeletal:        General: No tenderness. Normal range of motion.     Cervical back: Normal range of motion and neck supple.     Comments: No CVA tenderness  Skin:    General: Skin is warm.  Neurological:     Mental Status: She is alert and oriented to person, place, and time.     Cranial Nerves: No cranial nerve deficit.     Motor: No abnormal muscle tone.     Coordination: Coordination normal.     Comments:  5/5 strength throughout. CN 2-12 intact.Equal grip strength.   Psychiatric:        Behavior: Behavior normal.  ED Results / Procedures / Treatments   Labs (all labs ordered are listed, but only abnormal results are displayed) Labs Reviewed  URINALYSIS, ROUTINE W REFLEX MICROSCOPIC - Abnormal; Notable for the following components:      Result Value   APPearance CLOUDY (*)    Glucose, UA >=500 (*)    Hgb urine dipstick MODERATE (*)    Protein, ur 30 (*)    Leukocytes,Ua TRACE (*)    All other components within normal limits  CBC WITH DIFFERENTIAL/PLATELET - Abnormal; Notable for the following components:   RDW 15.6 (*)    All other components within normal limits  COMPREHENSIVE METABOLIC PANEL - Abnormal; Notable for the following components:   Glucose, Bld 163 (*)    All other components within normal limits  LIPASE, BLOOD - Abnormal; Notable for the following components:   Lipase 61 (*)    All other components within normal limits  URINALYSIS, MICROSCOPIC (REFLEX) - Abnormal; Notable for the following components:   Bacteria, UA MANY (*)    All other components within normal limits    EKG None  Radiology CT ABDOMEN PELVIS W CONTRAST  Result Date: 08/04/2022 CLINICAL DATA:  Right lower quadrant abdominal pain. Increased urinary frequency and urgency. EXAM: CT ABDOMEN AND PELVIS WITH CONTRAST TECHNIQUE: Multidetector CT imaging  of the abdomen and pelvis was performed using the standard protocol following bolus administration of intravenous contrast. RADIATION DOSE REDUCTION: This exam was performed according to the departmental dose-optimization program which includes automated exposure control, adjustment of the mA and/or kV according to patient size and/or use of iterative reconstruction technique. CONTRAST:  OMNIPAQUE IOHEXOL 300 MG/ML  SOLN COMPARISON:  07/01/2021 FINDINGS: Lower chest: No acute abnormality. Hepatobiliary: No focal liver abnormality is seen. No gallstones, gallbladder wall thickening, or biliary dilatation. Pancreas: Unremarkable. No pancreatic ductal dilatation or surrounding inflammatory changes. Spleen: Normal in size without focal abnormality. Adrenals/Urinary Tract: Normal adrenal glands. No nephrolithiasis, hydronephrosis or suspicious mass. Equivocal bladder wall thickening with mucosal enhancement. Stomach/Bowel: Stomach is within normal limits. The appendix is visualized and appears normal. No bowel wall thickening, inflammation, or distension. Vascular/Lymphatic: Aortic atherosclerosis. No enlarged abdominal or pelvic lymph nodes. Reproductive: Status post hysterectomy. No adnexal masses. Other: No free fluid or fluid collections. Musculoskeletal: No acute or significant osseous findings. IMPRESSION: 1. Equivocal bladder wall thickening with mucosal enhancement. Correlate with urinalysis to exclude cystitis. 2. Normal appendix. 3. Aortic Atherosclerosis (ICD10-I70.0). Electronically Signed   By: Signa Kell M.D.   On: 08/04/2022 06:06    Procedures Procedures    Medications Ordered in ED Medications  ketorolac (TORADOL) 30 MG/ML injection 15 mg (15 mg Intravenous Given 08/04/22 0434)  ondansetron (ZOFRAN) injection 4 mg (4 mg Intravenous Given 08/04/22 0434)    ED Course/ Medical Decision Making/ A&P                                 Medical Decision Making Amount and/or Complexity of  Data Reviewed Labs: ordered. Decision-making details documented in ED Course. Radiology: ordered and independent interpretation performed. Decision-making details documented in ED Course. ECG/medicine tests: ordered and independent interpretation performed. Decision-making details documented in ED Course.  Risk Prescription drug management.  3 days of urinary symptoms including right-sided lower abdominal pain.  No fever or vomiting.  Vital stable, no fever.  Abdomen soft without peritoneal signs.  UA consistent with UTI. Labs reassuring.  Normal creatinine.  No leukocytosis.  Give her right lower quadrant pain, CT scan is obtained to evaluate for appendicitis versus kidney stone versus other pathology.  Low suspicion for ovarian torsion.  CT scan as above.  No evidence of infected kidney stone or appendicitis.  Patient comfortable.  Tolerating p.o. and ambulatory.  Will treat for UTI with antibiotics.  Follow-up with PCP for recheck.  Return to the ED with worsening pain, fever, vomiting or other concerns.        Final Clinical Impression(s) / ED Diagnoses Final diagnoses:  None    Rx / DC Orders ED Discharge Orders     None         Melah Ebling, Jeannett Senior, MD 08/04/22 [id number]

## 2022-08-04 NOTE — Discharge Instructions (Signed)
Take the antibiotic as prescribed for urinary tract infection.  No evidence of appendicitis or kidney stone.  Pyridium will turn your urine orange so do not be alarmed by this. Return to the ED for worsening pain, fever, unable to urinate, persistent vomiting or other concerns

## 2022-08-04 NOTE — ED Triage Notes (Signed)
Pt reports 3 days of lower RT pelvic pain with increased urinary frequency. Pt states she has hx of UTI; no hx kidney stones. Pt states she urgently has to urinate often. No fevers.

## 2022-08-26 ENCOUNTER — Telehealth: Payer: Self-pay | Admitting: Pulmonary Disease

## 2022-08-26 NOTE — Telephone Encounter (Signed)
Patient is calling for a refill on her red pump inhaler albuterol.  [facility] in [place]

## 2022-08-27 NOTE — Telephone Encounter (Signed)
Patient has requested refill on albuterol inhaler. Not showing it has ever been prescribed by our office. Please advise if okay to send?

## 2022-08-28 ENCOUNTER — Other Ambulatory Visit: Payer: Self-pay | Admitting: Pulmonary Disease

## 2022-08-28 ENCOUNTER — Encounter: Payer: Self-pay | Admitting: Pulmonary Disease

## 2022-08-28 MED ORDER — ALBUTEROL SULFATE HFA 108 (90 BASE) MCG/ACT IN AERS: 2.0000 | INHALATION_SPRAY | RESPIRATORY_TRACT | 2 refills | Status: DC | PRN

## 2022-10-01 ENCOUNTER — Ambulatory Visit: Payer: 59 | Admitting: Pulmonary Disease

## 2022-10-20 ENCOUNTER — Ambulatory Visit (INDEPENDENT_AMBULATORY_CARE_PROVIDER_SITE_OTHER): Payer: 59 | Admitting: Pulmonary Disease

## 2022-10-20 ENCOUNTER — Encounter: Payer: Self-pay | Admitting: Pulmonary Disease

## 2022-10-20 VITALS — BP 150/90 | HR 86 | Ht 63.0 in | Wt 189.2 lb

## 2022-10-20 DIAGNOSIS — G4733 Obstructive sleep apnea (adult) (pediatric): Secondary | ICD-10-CM | POA: Diagnosis not present

## 2022-10-20 DIAGNOSIS — R0602 Shortness of breath: Secondary | ICD-10-CM | POA: Diagnosis not present

## 2022-10-20 DIAGNOSIS — J4489 Other specified chronic obstructive pulmonary disease: Secondary | ICD-10-CM

## 2022-10-20 NOTE — Progress Notes (Signed)
Sabrina Swanson    [id number]    [birth year]  Primary Care Physician:Lim, Lavonna Rua, DO  Referring Physician: Verdell Face, DO [address] [place],  [place] [address]  Chief complaint:   In for follow-up today for shortness of breath Shortness of breath developed postcardiac surgery  HPI:  Continues to do relatively well  Has not been staying very active  Uses Trelegy, albuterol as needed Denies chest pains or chest discomfort  Still not very compliant with CPAP on a nightly basis but she feels better at the days that she uses it sometimes falls asleep before putting the CPAP on  Denies specific chest pains or chest discomfort  Gets winded with usual activities  Most recent PFT with obstructive lung disease with significant bronchodilator response  Denies significant shortness of breath at rest  Previous chest x-ray findings showing hypoventilation, multifocal infiltrate, elevated left hemidiaphragm which has progressively improved  Reformed smoker No pertinent occupational history  chronic obstructive pulmonary disease She was not having significant shortness of breath prior to diagnosis of coronary artery disease   Outpatient Encounter Medications as of 10/20/2022  Medication Sig   albuterol (VENTOLIN HFA) 108 (90 Base) MCG/ACT inhaler Inhale 2 puffs into the lungs every 4 (four) hours as needed for shortness of breath.   aspirin 81 MG EC tablet Take 81 mg by mouth daily.   atorvastatin (LIPITOR) 80 MG tablet Take 40 mg by mouth daily.   carvedilol (COREG) 25 MG tablet Take 1 tablet (25 mg total) by mouth 2 (two) times daily with a meal.   cephALEXin (KEFLEX) 500 MG capsule Take 1 capsule (500 mg total) by mouth 3 (three) times daily.   clopidogrel (PLAVIX) 75 MG tablet Take 1 tablet (75 mg total) by mouth daily.   cyclobenzaprine (FLEXERIL) 10 MG tablet Take 1 tablet (10 mg total) by mouth 2 (two) times daily as needed for muscle spasms.    dapagliflozin propanediol (FARXIGA) 10 MG TABS tablet Take 1 tablet (10 mg total) by mouth daily before breakfast.   ergocalciferol (VITAMIN D2) 1.25 MG (50000 UT) capsule Take 50,000 Units by mouth every 7 (seven) days.   fluconazole (DIFLUCAN) 150 MG tablet Take 150 mg by mouth daily.   fluticasone furoate-vilanterol (BREO ELLIPTA) 100-25 MCG/ACT AEPB Inhale 1 puff into the lungs daily.   Fluticasone-Umeclidin-Vilant (TRELEGY ELLIPTA) 100-62.5-25 MCG/ACT AEPB Inhale 1 puff into the lungs daily.   Fluticasone-Umeclidin-Vilant (TRELEGY ELLIPTA) 100-62.5-25 MCG/ACT AEPB Inhale 100 mcg into the lungs daily.   Ipratropium-Albuterol (COMBIVENT) 20-100 MCG/ACT AERS respimat Inhale 1 puff into the lungs daily in the afternoon.   levofloxacin (LEVAQUIN) 500 MG tablet Take 500 mg by mouth daily.   losartan (COZAAR) 50 MG tablet Take 50 mg by mouth daily.   metFORMIN (GLUCOPHAGE-XR) 500 MG 24 hr tablet Take 500 mg by mouth daily.   naproxen (NAPROSYN) 500 MG tablet Take 1 tablet (500 mg total) by mouth 2 (two) times daily.   NARCAN 4 MG/0.1ML LIQD nasal spray kit Place 4 mg into the nose once.   nitroGLYCERIN (NITROSTAT) 0.4 MG SL tablet Place 0.4 mg under the tongue every 5 (five) minutes as needed for chest pain.   Omega-3 Fatty Acids (FISH OIL) 1000 MG CAPS Take 1,000 mg by mouth daily.   oxyCODONE-acetaminophen (PERCOCET) 10-325 MG tablet Take 1 tablet by mouth every 8 (eight) hours.   phenazopyridine (PYRIDIUM) 100 MG tablet Take 1 tablet (100 mg total) by mouth  3 (three) times daily as needed for pain.   vitamin B-12 (CYANOCOBALAMIN) 1000 MCG tablet Take 1,000 mcg by mouth daily.   No facility-administered encounter medications on file as of 10/20/2022.    Allergies as of 10/20/2022 - Review Complete 10/20/2022  Allergen Reaction Noted   Acetaminophen Itching and Nausea Only 07/13/2016    Past Medical History:  Diagnosis Date   Abnormal stress test 04/16/2020   Acquired trigger finger  06/10/2012   Alopecia 05/16/2013   Angina pectoris (HCC) 11/13/2019   Anxiety state 06/10/2012   Arthritis of right acromioclavicular joint 11/27/2018   Back pain    Benign neoplasm of colon 06/10/2012   Bursitis disorder 05/11/2011   CAD (coronary artery disease), native coronary artery 04/22/2020   Cardiac murmur 11/13/2019   Carpal tunnel syndrome 06/10/2012   Formatting of this note might be different from the original. 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/2013   DDD (degenerative disc disease), lumbosacral 06/10/2012   Depressive disorder 06/10/2012   Diabetes mellitus due to underlying condition with hyperosmolarity without coma, without long-term current use of insulin (HCC) 04/16/2020   Diabetes mellitus without complication (HCC)    Dyspareunia 03/04/2014   Dysthymia 06/10/2012   Encounter for long-term (current) use of other medications 06/10/2012   Essential hypertension 08/14/2019   Former smoker 05/21/2020   Generalized anxiety disorder 06/10/2012   Hammer toe of right foot 10/23/2014   Formatting of this note might be different from the original. Second and third tarsals Formatting of this note might be different from the original. Formatting of this note might be different from the original. Second and third tarsals   High cholesterol    Hypercholesterolemia 03/06/2013   Hypertension    Insomnia 06/10/2012   Lumbosacral spondylosis 06/10/2012   Lump of skin 10/23/2014   Formatting of this note might be different from the original. Right breast-manipulated by patient prior to clinical exam   Migraine syndrome 06/10/2012   Mixed hyperlipidemia 04/16/2020   Moderate obstructive sleep apnea 07/16/2021   Numbness of toes 10/23/2014   Osteoarthrosis, generalized, involving multiple sites  06/10/2012   Pain in soft tissues of limb 06/10/2012   Formatting of this note might be different from the original. 03/09/11 right arm.   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   Unspecified cataract 06/30/2015   Vertigo 06/14/2013   Vestibular dizziness 06/10/2012    Past Surgical History:  Procedure Laterality Date   ABDOMINAL HYSTERECTOMY     CESAREAN SECTION     CORONARY ARTERY BYPASS GRAFT N/A 04/23/2020   Procedure: CORONARY ARTERY BYPASS GRAFTING (CABG), ON PUMP, TIMES THREE, USING LEFT INTERNAL MAMMARY ARTERY AND ENDOSCOPICALLY HARVESTED RIGHT GREATER SAPHENOUS VEIN;  Surgeon: Linden Dolin, MD;  Location: [facility] OR;  Service: Open Heart Surgery;  Laterality: N/A;  LEFT HEART CATH AND CORONARY ANGIOGRAPHY N/A 04/22/2020   Procedure: LEFT HEART CATH AND CORONARY ANGIOGRAPHY;  Surgeon: Lennette Bihari, MD;  Location: [facility] INVASIVE CV LAB;  Service: Cardiovascular;  Laterality: N/A;   TEE WITHOUT CARDIOVERSION N/A 04/23/2020   Procedure: TRANSESOPHAGEAL ECHOCARDIOGRAM (TEE);  Surgeon: Linden Dolin, MD;  Location: [facility] OR;  Service: Open Heart Surgery;  Laterality: N/A;    Family History  Problem Relation Age of Onset   Stomach cancer Mother    Hypertension Maternal Grandmother    Diabetes Maternal Grandfather     Social History   Socioeconomic History   Marital status: Single    Spouse name: Not on file   Number of children: Not on file   Years of education: Not on file   Highest education level: Not on  file  Occupational History   Not on file  Tobacco Use   Smoking status: Former    Current packs/day: 0.00    Types: Cigarettes    Quit date: 04/22/2020    Years since quitting: 2.4    Passive exposure: Past   Smokeless tobacco: Never  Vaping Use   Vaping status: Never Used  Substance and Sexual Activity   Alcohol use: Yes    Comment: occasional   Drug use: No    Comment: did eat marijuana brownie last week   Sexual activity: Not on file  Other Topics Concern   Not on file  Social History Narrative   Not on file   Social Determinants of Health   Financial Resource Strain: Not on file  Food Insecurity: Not on file  Transportation Needs: Not on file  Physical Activity: Not on file  Stress: Not on file  Social Connections: Not on file  Intimate Partner Violence: Not on file    Review of Systems  Constitutional:  Negative for fatigue.  Respiratory:  Positive for shortness of breath. Negative for chest tightness.     Vitals:   10/20/22 1530  BP: (!) 150/90  Pulse: 86  SpO2: 97%     Physical Exam Constitutional:      Appearance: She is obese.  HENT:     Head: Normocephalic.     Nose: No congestion.     Mouth/Throat:     Mouth: Mucous membranes are moist.  Eyes:     General: No scleral icterus. Cardiovascular:     Rate and Rhythm: Normal rate and regular rhythm.     Heart sounds: No murmur heard.    No friction rub.  Pulmonary:     Effort: No respiratory distress.     Breath sounds: No stridor. No wheezing or rhonchi.  Musculoskeletal:     Cervical back: No rigidity or tenderness.  Neurological:     General: No focal deficit present.     Mental Status: She is alert and oriented to person, place, and time.  Psychiatric:        Mood and Affect: Mood normal.    CPAP compliance of 39% AutoSet-5-15, average use of 4 hours 59 minutes residual AHI of 0.9   Data Reviewed:  CT scan of the chest reviewed-on 04/28/2020  Pulmonary function test reviewed and  does reveal obstructive disease Severe obstructive disease with significant bronchodilator response  Assessment:  Shortness of breath on exertion -Symptoms remain about the same -Etiology is multifactorial  Severe obstructive lung disease Chronic obstructive pulmonary disease -Continue Trelegy -Albuterol as needed  History of coronary artery disease Diastolic heart failure Diaphragmatic dysfunction -Continue to  monitor closely  Moderate obstructive sleep apnea -Importance of compliance with CPAP discussed   Plan: .  Encouraged to continue exercises  .  Continue Trelegy  .  Oxygen supplementation as needed  .  Follow-up in 6 months   Virl Diamond MD [facility] 10/20/2022, 3:43 PM  CC: Verdell Face, DO

## 2022-10-20 NOTE — Patient Instructions (Signed)
I will see you back in about 6 months  Continue using CPAP on a nightly basis  Continue using your Trelegy on a daily basis  Continue regular exercises -Regular challenges help to keep your activity level  Continue weight loss efforts  Call us with significant concerns

## 2022-11-11 ENCOUNTER — Ambulatory Visit: Payer: 59 | Attending: Cardiology | Admitting: Cardiology

## 2022-11-11 ENCOUNTER — Encounter: Payer: Self-pay | Admitting: Cardiology

## 2022-11-11 VITALS — BP 136/82 | HR 61 | Ht 63.0 in | Wt 187.1 lb

## 2022-11-11 DIAGNOSIS — E782 Mixed hyperlipidemia: Secondary | ICD-10-CM

## 2022-11-11 DIAGNOSIS — I10 Essential (primary) hypertension: Secondary | ICD-10-CM

## 2022-11-11 DIAGNOSIS — I2510 Atherosclerotic heart disease of native coronary artery without angina pectoris: Secondary | ICD-10-CM

## 2022-11-11 DIAGNOSIS — Z951 Presence of aortocoronary bypass graft: Secondary | ICD-10-CM

## 2022-11-11 DIAGNOSIS — E0800 Diabetes mellitus due to underlying condition with hyperosmolarity without nonketotic hyperglycemic-hyperosmolar coma (NKHHC): Secondary | ICD-10-CM | POA: Diagnosis not present

## 2022-11-11 MED ORDER — NITROGLYCERIN 0.4 MG SL SUBL: 0.4000 mg | SUBLINGUAL_TABLET | SUBLINGUAL | 6 refills | Status: AC | PRN

## 2022-11-11 NOTE — Addendum Note (Signed)
Addended by: Eleonore Chiquito on: 11/11/2022 09:42 AM   Modules accepted: Orders

## 2022-11-11 NOTE — Patient Instructions (Signed)
Medication Instructions:  Your physician recommends that you continue on your current medications as directed. Please refer to the Current Medication list given to you today.  *If you need a refill on your cardiac medications before your next appointment, please call your pharmacy*   Lab Work: Your physician recommends that you have a CMP, CBC, TSH, A1C and Lipids today in the office.  If you have labs (blood work) drawn today and your tests are completely normal, you will receive your results only by: MyChart Message (if you have MyChart) OR A paper copy in the mail If you have any lab test that is abnormal or we need to change your treatment, we will call you to review the results.   Testing/Procedures: None ordered   Follow-Up: At [facility], you and your health needs are our priority.  As part of our continuing mission to provide you with exceptional heart care, we have created designated Provider Care Teams.  These Care Teams include your primary Cardiologist (physician) and Advanced Practice Providers (APPs -  Physician Assistants and Nurse Practitioners) who all work together to provide you with the care you need, when you need it.  We recommend signing up for the patient portal called "MyChart".  Sign up information is provided on this After Visit Summary.  MyChart is used to connect with patients for Virtual Visits (Telemedicine).  Patients are able to view lab/test results, encounter notes, upcoming appointments, etc.  Non-urgent messages can be sent to your provider as well.   To learn more about what you can do with MyChart, go to [email or web address].    Your next appointment:   9 month(s)  The format for your next appointment:   In Person  Provider:   Belva Crome, MD    Other Instructions none  Important Information About Sugar

## 2022-11-11 NOTE — Progress Notes (Signed)
Cardiology Office Note:    Date:  11/11/2022   ID:  Sabrina Swanson, DOB Jul 06, 1960, MRN [id number]  PCP:  Verdell Face, DO  Cardiologist:  Garwin Brothers, MD   Referring MD: Verdell Face, DO    ASSESSMENT:    1. Mixed hyperlipidemia   2. Coronary artery disease involving native coronary artery of native heart without angina pectoris   3. Essential hypertension   4. Diabetes mellitus due to underlying condition with hyperosmolarity without coma, without long-term current use of insulin (HCC)   5. S/P CABG x 3    PLAN:    In order of problems listed above:  Coronary artery disease: Secondary prevention stressed to the patient.  Importance of compliance with diet medication stressed and she vocalized understanding.  I told her the hazards and parents of a sedentary lifestyle.  She will start walking at least half an hour a day 5 days a week. Essential hypertension: Blood pressure is stable and diet was emphasized.  Lifestyle modification urged. Mixed dyslipidemia: On lipid-lowering medications.  She is fasting and will have complete blood work today.  Goal LDL less than 60.  Diet emphasized. Diabetes mellitus and obesity: Lifestyle modifications stressed weight reduction emphasized she promises to do better. Patient will be seen in follow-up appointment in 6 months or earlier if the patient has any concerns.    Medication Adjustments/Labs and Tests Ordered: Current medicines are reviewed at length with the patient today.  Concerns regarding medicines are outlined above.  Orders Placed This Encounter  Procedures   EKG 12-Lead   No orders of the defined types were placed in this encounter.    No chief complaint on file.    History of Present Illness:    Sabrina Swanson is a 62 y.o. female.  Patient has past medical history of coronary artery disease post CABG surgery, essential hypertension, mixed dyslipidemia, diabetes mellitus and obesity.  She is an ex-smoker but quit 2  years ago.  She leads a sedentary lifestyle.  She denies any chest pain orthopnea or PND.  At the time of my evaluation, the patient is alert awake oriented and in no distress.  Past Medical History:  Diagnosis Date   Abnormal stress test 04/16/2020   Acquired trigger finger 06/10/2012   Alopecia 05/16/2013   Angina pectoris (HCC) 11/13/2019   Anxiety state 06/10/2012   Arthritis of right acromioclavicular joint 11/27/2018   Back pain    Benign neoplasm of colon 06/10/2012   Bursitis disorder 05/11/2011   CAD (coronary artery disease), native coronary artery 04/22/2020   Cardiac murmur 11/13/2019   Carpal tunnel syndrome 06/10/2012   Formatting of this note might be different from the original. 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   DDD (degenerative disc disease), lumbosacral 06/10/2012   Depressive disorder 06/10/2012   Diabetes mellitus due to underlying condition with hyperosmolarity without coma, without long-term current use of insulin (HCC) 04/16/2020   Diabetes mellitus without complication (HCC)    Difficulty walking 06/21/2022   Dyspareunia 03/04/2014   Dysthymia 06/10/2012   Encounter for long-term (current) use of other medications 06/10/2012   Essential hypertension 08/14/2019   Former smoker 05/21/2020   Generalized anxiety disorder 06/10/2012   Hammer toe of  right foot 10/23/2014   Formatting of this note might be different from the original. Second and third tarsals Formatting of this note might be different from the original. Formatting of this note might be different from the original. Second and third tarsals   High cholesterol    Hypercholesterolemia 03/06/2013    Hypertension    Impaired mobility and ADLs 06/21/2022   Insomnia 06/10/2012   Lumbosacral spondylosis 06/10/2012   Lump of skin 10/23/2014   Formatting of this note might be different from the original. Right breast-manipulated by patient prior to clinical exam   Migraine syndrome 06/10/2012   Mixed hyperlipidemia 04/16/2020   Moderate obstructive sleep apnea 07/16/2021   Numbness of toes 10/23/2014   Osteoarthrosis, generalized, involving multiple sites 06/10/2012   Pain in soft tissues of limb 06/10/2012   Formatting of this note might be different from the original. 03/09/11 right arm.   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   Unspecified cataract 06/30/2015   Vertigo 06/14/2013   Vestibular dizziness 06/10/2012    Past Surgical History:  Procedure Laterality Date   ABDOMINAL HYSTERECTOMY     CESAREAN SECTION     CORONARY ARTERY BYPASS GRAFT N/A 04/23/2020   Procedure: CORONARY ARTERY BYPASS GRAFTING (CABG), ON PUMP, TIMES THREE, USING LEFT INTERNAL MAMMARY ARTERY AND ENDOSCOPICALLY HARVESTED RIGHT GREATER SAPHENOUS VEIN;  Surgeon: Linden Dolin, MD;  Location: [facility] OR;  Service: Open Heart Surgery;  Laterality: N/A;   LEFT HEART CATH AND CORONARY ANGIOGRAPHY N/A 04/22/2020   Procedure: LEFT HEART CATH AND CORONARY ANGIOGRAPHY;  Surgeon: Lennette Bihari, MD;  Location: [facility] INVASIVE CV LAB;  Service: Cardiovascular;  Laterality: N/A;   TEE WITHOUT CARDIOVERSION N/A 04/23/2020   Procedure:  TRANSESOPHAGEAL ECHOCARDIOGRAM (TEE);  Surgeon: Linden Dolin, MD;  Location: [facility] OR;  Service: Open Heart Surgery;  Laterality: N/A;    Current Medications: Current Meds  Medication Sig   albuterol (VENTOLIN HFA) 108 (90 Base) MCG/ACT inhaler Inhale 2 puffs into the lungs every 4 (four) hours as needed for shortness of breath.   aspirin 81 MG EC tablet Take 81 mg by mouth daily.   atorvastatin (LIPITOR) 80 MG tablet Take 40 mg by mouth daily.   carvedilol (COREG) 25 MG tablet Take 1 tablet (25 mg total) by mouth 2 (two) times daily with a meal.   cephALEXin (KEFLEX) 500 MG capsule Take 1 capsule (500 mg total) by mouth 3 (three) times daily.   clopidogrel (PLAVIX) 75 MG tablet Take 1 tablet (75 mg total) by mouth daily.   cyclobenzaprine (FLEXERIL) 10 MG tablet Take 1 tablet (10 mg total) by mouth 2 (two) times daily as needed for muscle spasms.   dapagliflozin propanediol (FARXIGA) 10 MG TABS tablet Take 1 tablet (10 mg total) by mouth daily before breakfast.   ergocalciferol (VITAMIN D2) 1.25 MG (50000 UT) capsule Take 50,000 Units by mouth every 7 (seven) days.   fluconazole (DIFLUCAN) 150 MG tablet Take 150 mg by mouth daily.   fluticasone furoate-vilanterol (  BREO ELLIPTA) 100-25 MCG/ACT AEPB Inhale 1 puff into the lungs daily.   Fluticasone-Umeclidin-Vilant (TRELEGY ELLIPTA) 100-62.5-25 MCG/ACT AEPB Inhale 1 puff into the lungs daily.   Fluticasone-Umeclidin-Vilant (TRELEGY ELLIPTA) 100-62.5-25 MCG/ACT AEPB Inhale 100 mcg into the lungs daily.   Ipratropium-Albuterol (COMBIVENT) 20-100 MCG/ACT AERS respimat Inhale 1 puff into the lungs daily in the afternoon.   levofloxacin (LEVAQUIN) 500 MG tablet Take 500 mg by mouth daily.   losartan (COZAAR) 50 MG tablet Take 50 mg by mouth daily.   metFORMIN (GLUCOPHAGE-XR) 500 MG 24 hr tablet Take 500 mg by mouth daily.   naproxen (NAPROSYN) 500 MG tablet Take 1 tablet (500 mg total) by mouth 2 (two) times daily.   NARCAN 4 MG/0.1ML LIQD nasal  spray kit Place 4 mg into the nose once.   nitroGLYCERIN (NITROSTAT) 0.4 MG SL tablet Place 0.4 mg under the tongue every 5 (five) minutes as needed for chest pain.   Omega-3 Fatty Acids (FISH OIL) 1000 MG CAPS Take 1,000 mg by mouth daily.   oxyCODONE-acetaminophen (PERCOCET) 10-325 MG tablet Take 1 tablet by mouth every 8 (eight) hours.   phenazopyridine (PYRIDIUM) 100 MG tablet Take 1 tablet (100 mg total) by mouth 3 (three) times daily as needed for pain.   vitamin B-12 (CYANOCOBALAMIN) 1000 MCG tablet Take 1,000 mcg by mouth daily.     Allergies:   Acetaminophen   Social History   Socioeconomic History   Marital status: Single    Spouse name: Not on file   Number of children: Not on file   Years of education: Not on file   Highest education level: Not on file  Occupational History   Not on file  Tobacco Use   Smoking status: Former    Current packs/day: 0.00    Types: Cigarettes    Quit date: 04/22/2020    Years since quitting: 2.5    Passive exposure: Past   Smokeless tobacco: Never  Vaping Use   Vaping status: Never Used  Substance and Sexual Activity   Alcohol use: Yes    Comment: occasional   Drug use: No    Comment: did eat marijuana brownie last week   Sexual activity: Not on file  Other Topics Concern   Not on file  Social History Narrative   Not on file   Social Determinants of Health   Financial Resource Strain: Not on file  Food Insecurity: Not on file  Transportation Needs: Not on file  Physical Activity: Not on file  Stress: Not on file  Social Connections: Not on file     Family History: The patient's family history includes Diabetes in her maternal grandfather; Hypertension in her maternal grandmother; Stomach cancer in her mother.  ROS:   Please see the history of present illness.    All other systems reviewed and are negative.  EKGs/Labs/Other Studies Reviewed:    The following studies were reviewed today: .EKG  Interpretation Date/Time:  Thursday November 11 2022 09:05:27 EST Ventricular Rate:  61 PR Interval:  178 QRS Duration:  90 QT Interval:  426 QTC Calculation: 428 R Axis:   85  Text Interpretation: Normal sinus rhythm Nonspecific ST and T wave abnormality When compared with ECG of 12-Aug-2021 18:23, Vent. rate has decreased BY  38 BPM Nonspecific T wave abnormality no longer evident in Inferior leads T wave inversion no longer evident in Lateral leads Confirmed by Belva Crome [id number]) on 11/11/2022 9:26:32 AM    Recent Labs: 08/04/2022: ALT 25; BUN 16;  Creatinine, Ser 0.94; Hemoglobin 13.5; Platelets 224; Potassium 4.1; Sodium 138  Recent Lipid Panel    Component Value Date/Time   CHOL 153 02/20/2021 0903   TRIG 93 02/20/2021 0903   HDL 35 (L) 02/20/2021 0903   CHOLHDL 4.4 02/20/2021 0903   CHOLHDL 5.5 05/01/2020 0025   VLDL 22 05/01/2020 0025   LDLCALC 100 (H) 02/20/2021 0903    Physical Exam:    VS:  BP 136/82   Pulse 61   Ht 5\' 3"  (1.6 m)   Wt 187 lb 1.3 oz (84.9 kg)   SpO2 93%   BMI 33.14 kg/m     Wt Readings from Last 3 Encounters:  11/11/22 187 lb 1.3 oz (84.9 kg)  10/20/22 189 lb 3.2 oz (85.8 kg)  08/04/22 179 lb 14.3 oz (81.6 kg)     GEN: Patient is in no acute distress HEENT: Normal NECK: No JVD; No carotid bruits LYMPHATICS: No lymphadenopathy CARDIAC: Hear sounds regular, 2/6 systolic murmur at the apex. RESPIRATORY:  Clear to auscultation without rales, wheezing or rhonchi  ABDOMEN: Soft, non-tender, non-distended MUSCULOSKELETAL:  No edema; No deformity  SKIN: Warm and dry NEUROLOGIC:  Alert and oriented x 3 PSYCHIATRIC:  Normal affect   Signed, Garwin Brothers, MD  11/11/2022 9:28 AM     [facility]

## 2022-11-12 LAB — COMPREHENSIVE METABOLIC PANEL
ALT: 24 [IU]/L (ref 0–32)
AST: 16 [IU]/L (ref 0–40)
Albumin: 4.3 g/dL (ref 3.9–4.9)
Alkaline Phosphatase: 119 [IU]/L (ref 44–121)
BUN/Creatinine Ratio: 15 (ref 12–28)
BUN: 12 mg/dL (ref 8–27)
Bilirubin Total: 0.2 mg/dL (ref 0.0–1.2)
CO2: 20 mmol/L (ref 20–29)
Calcium: 8.9 mg/dL (ref 8.7–10.3)
Chloride: 110 mmol/L — ABNORMAL HIGH (ref 96–106)
Creatinine, Ser: 0.8 mg/dL (ref 0.57–1.00)
Globulin, Total: 2.1 g/dL (ref 1.5–4.5)
Glucose: 140 mg/dL — ABNORMAL HIGH (ref 70–99)
Potassium: 4.8 mmol/L (ref 3.5–5.2)
Sodium: 143 mmol/L (ref 134–144)
Total Protein: 6.4 g/dL (ref 6.0–8.5)
eGFR: 83 mL/min/{1.73_m2} (ref >59)

## 2022-11-12 LAB — CBC
Hematocrit: 43.2 % (ref 34.0–46.6)
Hemoglobin: 14.1 g/dL (ref 11.1–15.9)
MCH: 30 pg (ref 26.6–33.0)
MCHC: 32.6 g/dL (ref 31.5–35.7)
MCV: 92 fL (ref 79–97)
Platelets: 215 10*3/uL (ref 150–450)
RBC: 4.7 x10E6/uL (ref 3.77–5.28)
RDW: 13.7 % (ref 11.7–15.4)
WBC: 5.3 10*3/uL (ref 3.4–10.8)

## 2022-11-12 LAB — HEMOGLOBIN A1C
Est. average glucose Bld gHb Est-mCnc: 183 mg/dL
Hgb A1c MFr Bld: 8 % — ABNORMAL HIGH (ref 4.8–5.6)

## 2022-11-12 LAB — LIPID PANEL
Chol/HDL Ratio: 3.2 ratio (ref 0.0–4.4)
Cholesterol, Total: 110 mg/dL (ref 100–199)
HDL: 34 mg/dL — ABNORMAL LOW (ref >39)
LDL Chol Calc (NIH): 61 mg/dL (ref 0–99)
Triglycerides: 70 mg/dL (ref 0–149)
VLDL Cholesterol Cal: 15 mg/dL (ref 5–40)

## 2022-11-12 LAB — TSH: TSH: 0.797 u[IU]/mL (ref 0.450–4.500)

## 2023-02-10 IMAGING — DX DG CHEST 2V
2 series · 2 of 2 positions shown · non-contrast
Comparison: 04/28/2020, CT 04/28/2020

CLINICAL DATA: Respiratory distress

EXAM:
CHEST - 2 VIEW

[chest lat]
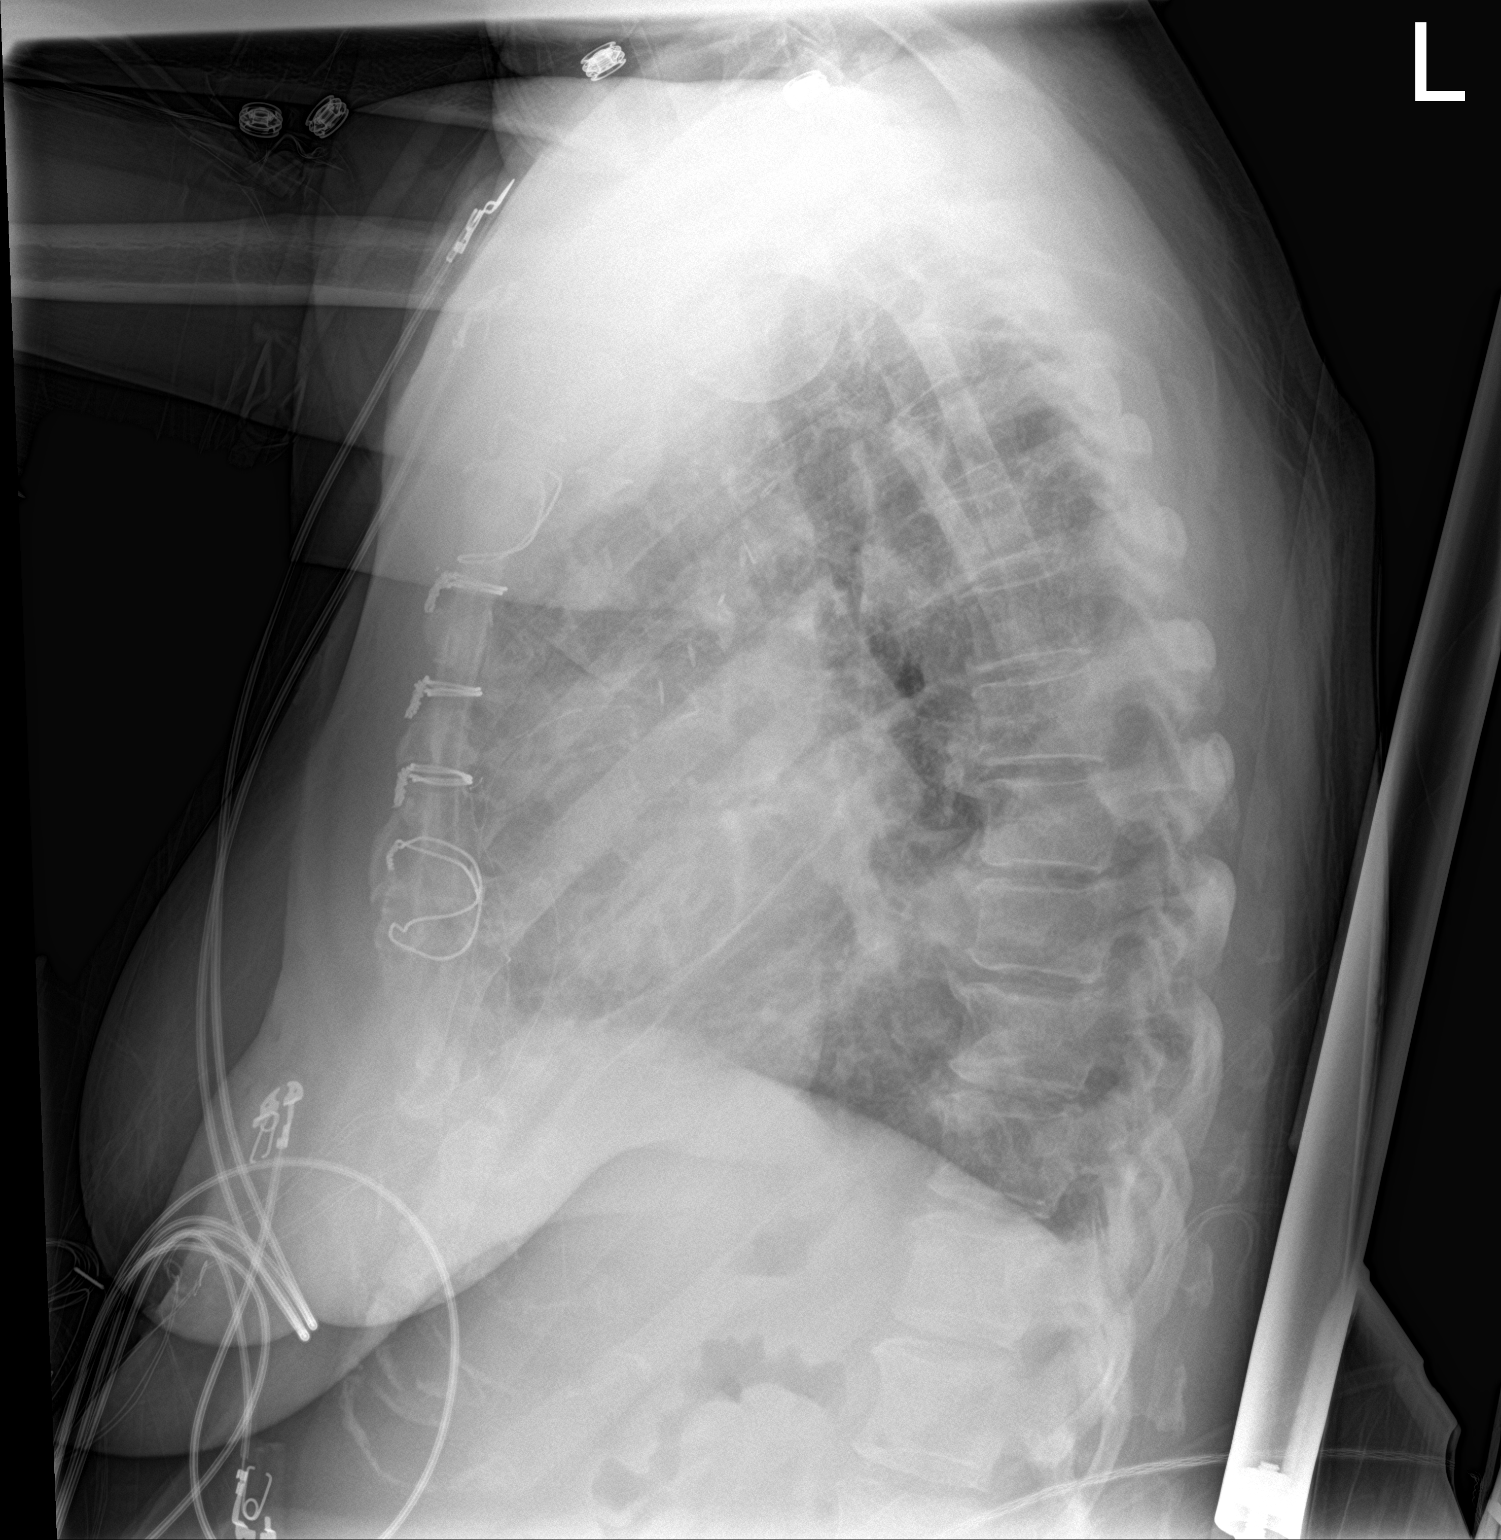

[chest ap]
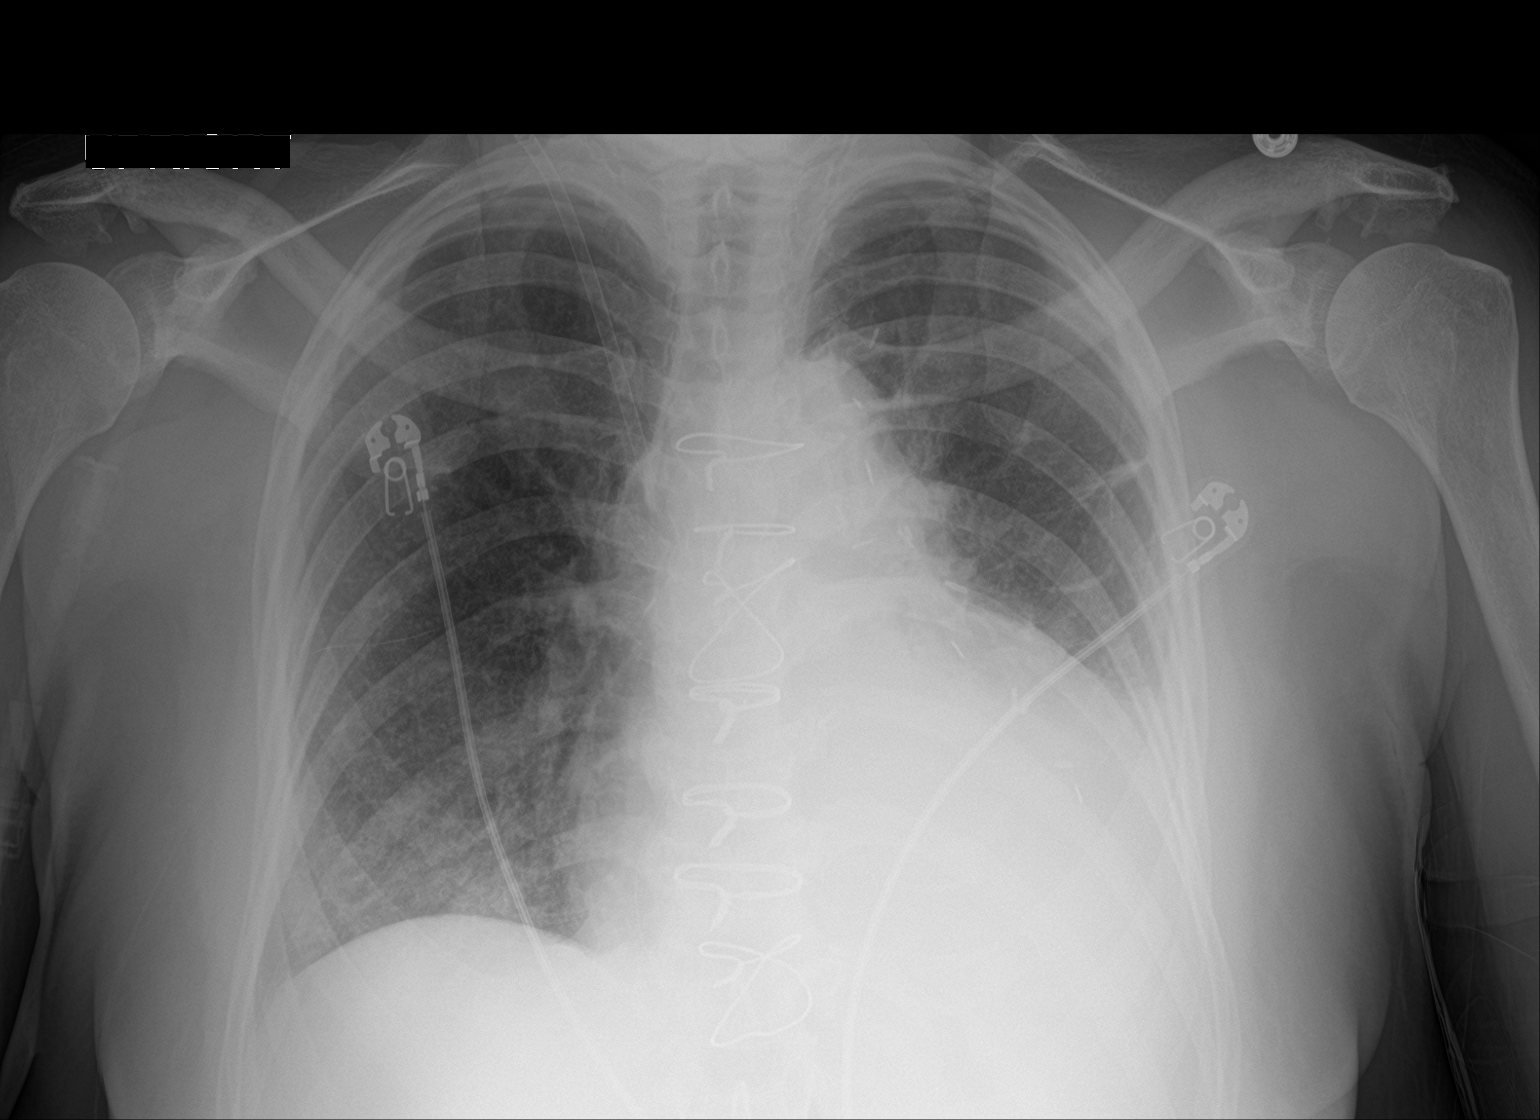

[2 of 2 positions shown; findings below may reference images not displayed]

FINDINGS: The lungs are well expanded and pulmonary insufflation is stable
when compared to prior examination. Right internal jugular Cordis
introducer is unchanged. Retrocardiac opacification related to left
basilar consolidation is unchanged. Small left pleural effusion is
likely present. Right basilar infiltrate is stable. Coronary artery
bypass grafting has been performed. Cardiac size is mildly enlarged.
Pulmonary vascularity is stable. No acute bone abnormality.
IMPRESSION: Stable retrocardiac consolidation and right basilar focal pulmonary
infiltrate. Small left pleural effusions suspected.

Stable cardiomegaly.

## 2023-02-12 IMAGING — CR DG CHEST 2V
2 series · 2 of 2 positions shown · non-contrast
Comparison: April 29, 2020.

CLINICAL DATA: History of open heart surgery.

EXAM:
CHEST - 2 VIEW

[chest lat]
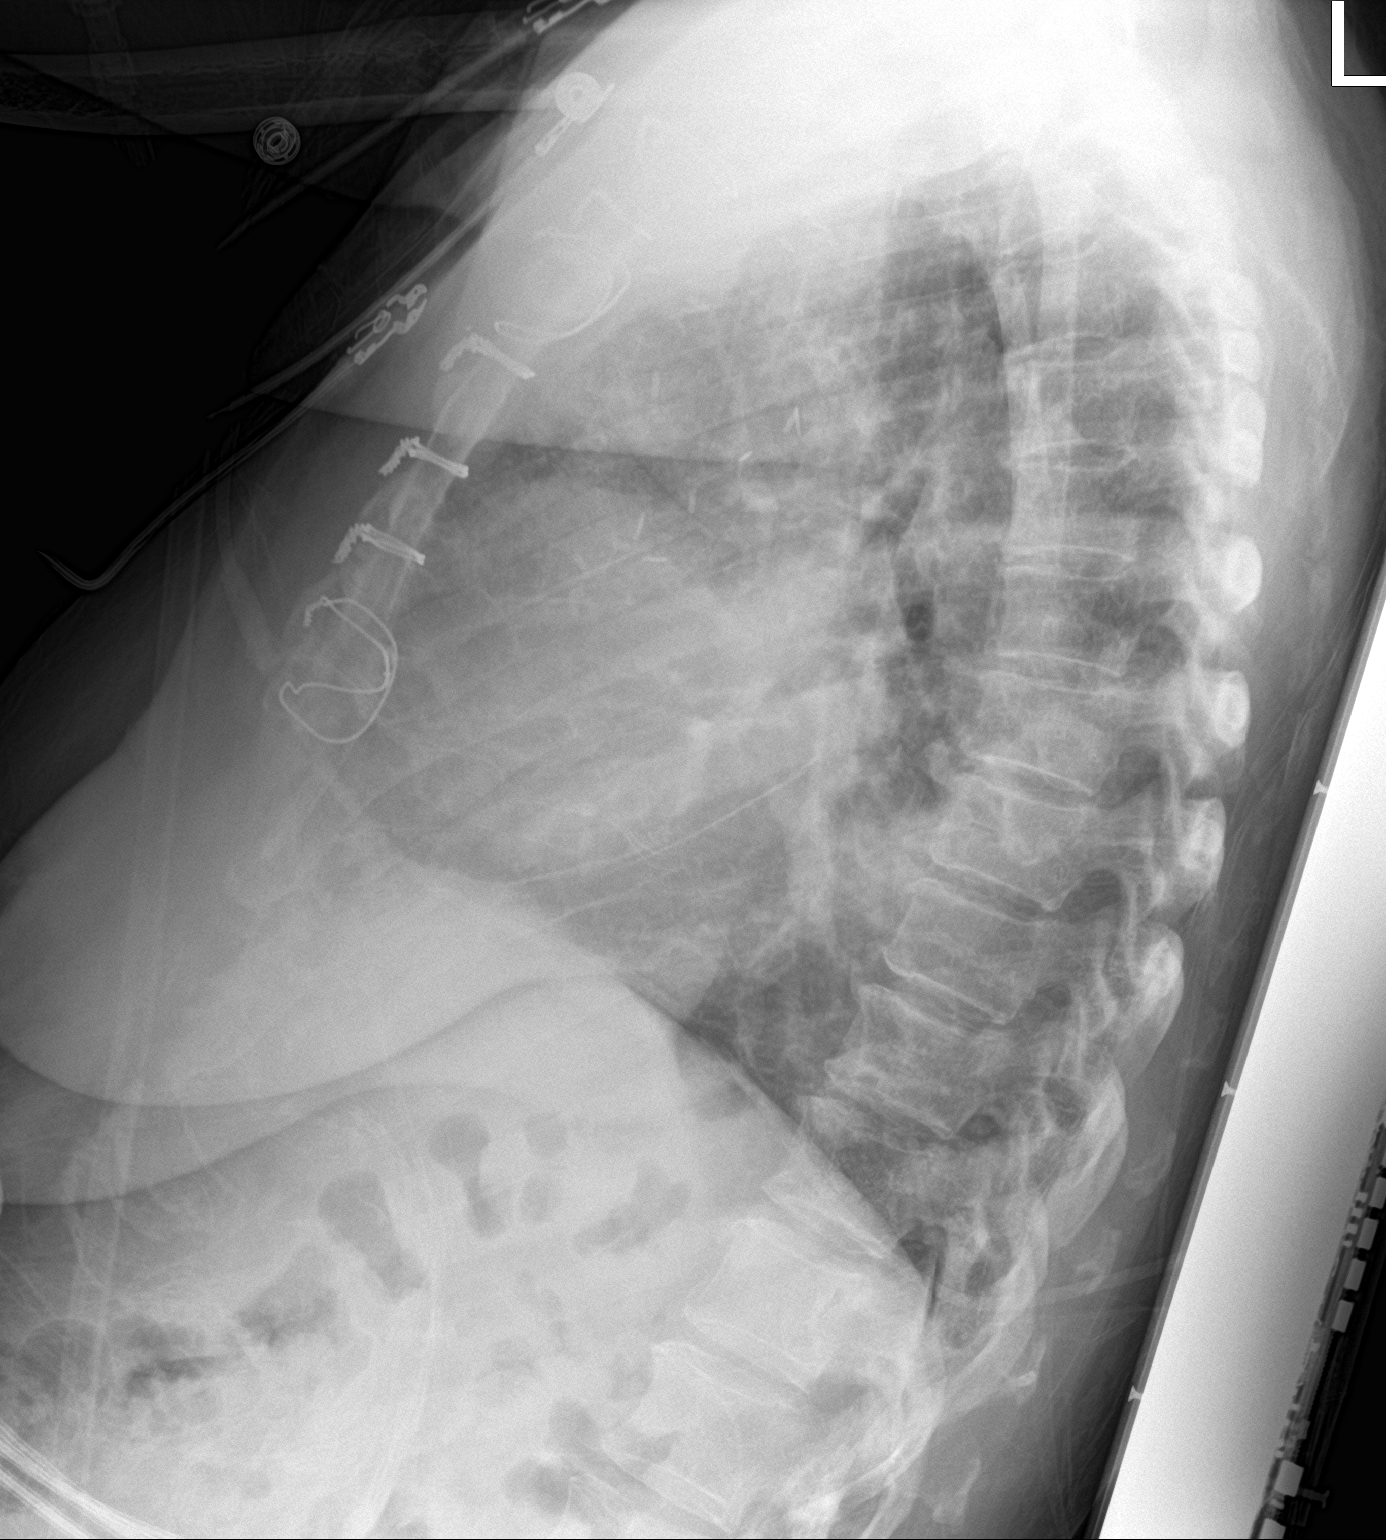

[chest ap]
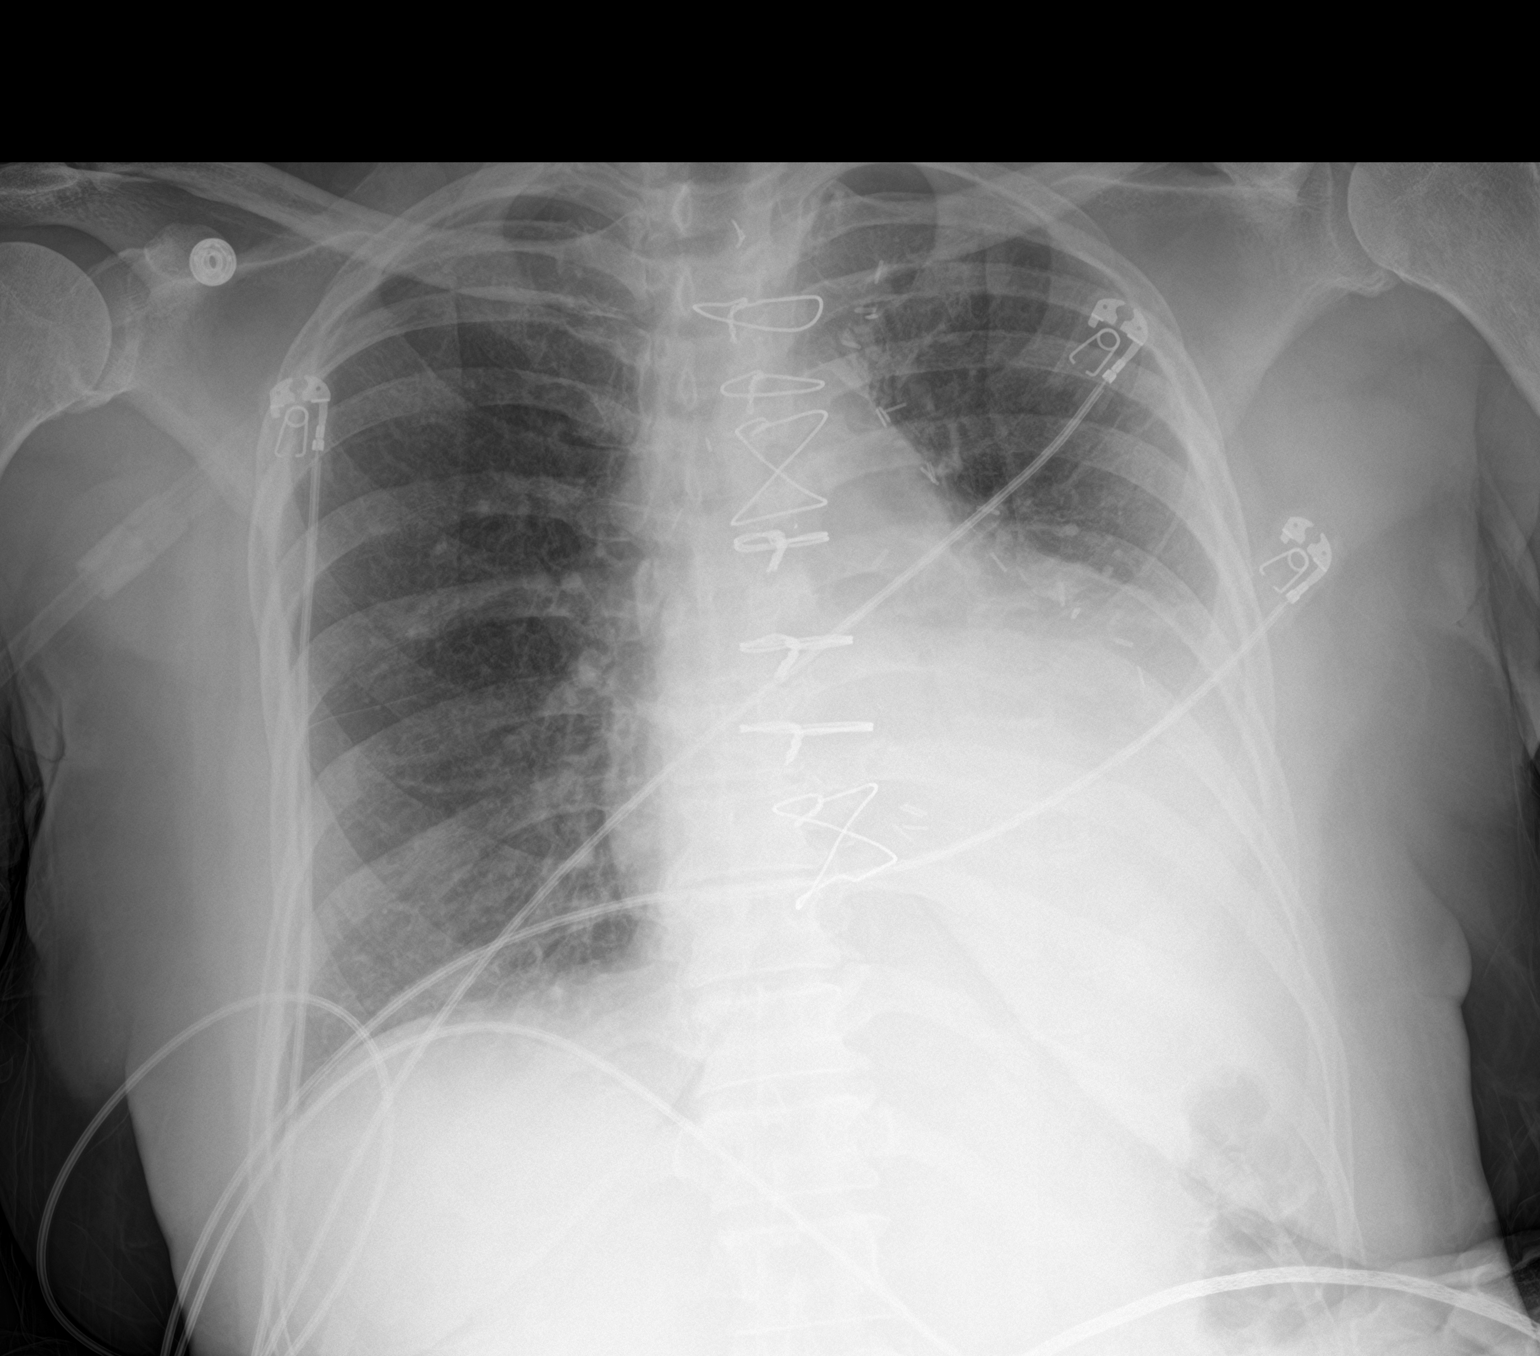

[2 of 2 positions shown; findings below may reference images not displayed]

FINDINGS: Similar suspected left pleural effusion with left basilar
consolidation. No visible pneumothorax. Similar enlargement of the
cardiac silhouette with pericardial effusions noted on prior CT
chest. CABG with median sternotomy.
IMPRESSION: 1. Similar suspected left pleural effusion with left basilar
consolidation.
2. Similar enlarged cardiac silhouette.

## 2023-02-15 ENCOUNTER — Other Ambulatory Visit: Payer: Self-pay | Admitting: Cardiology

## 2023-02-17 IMAGING — CR DG CHEST 2V
2 series · 2 of 2 positions shown · non-contrast
Comparison: 05/01/2020.

CLINICAL DATA: S post CABG.

EXAM:
CHEST - 2 VIEW

[w chest pa]
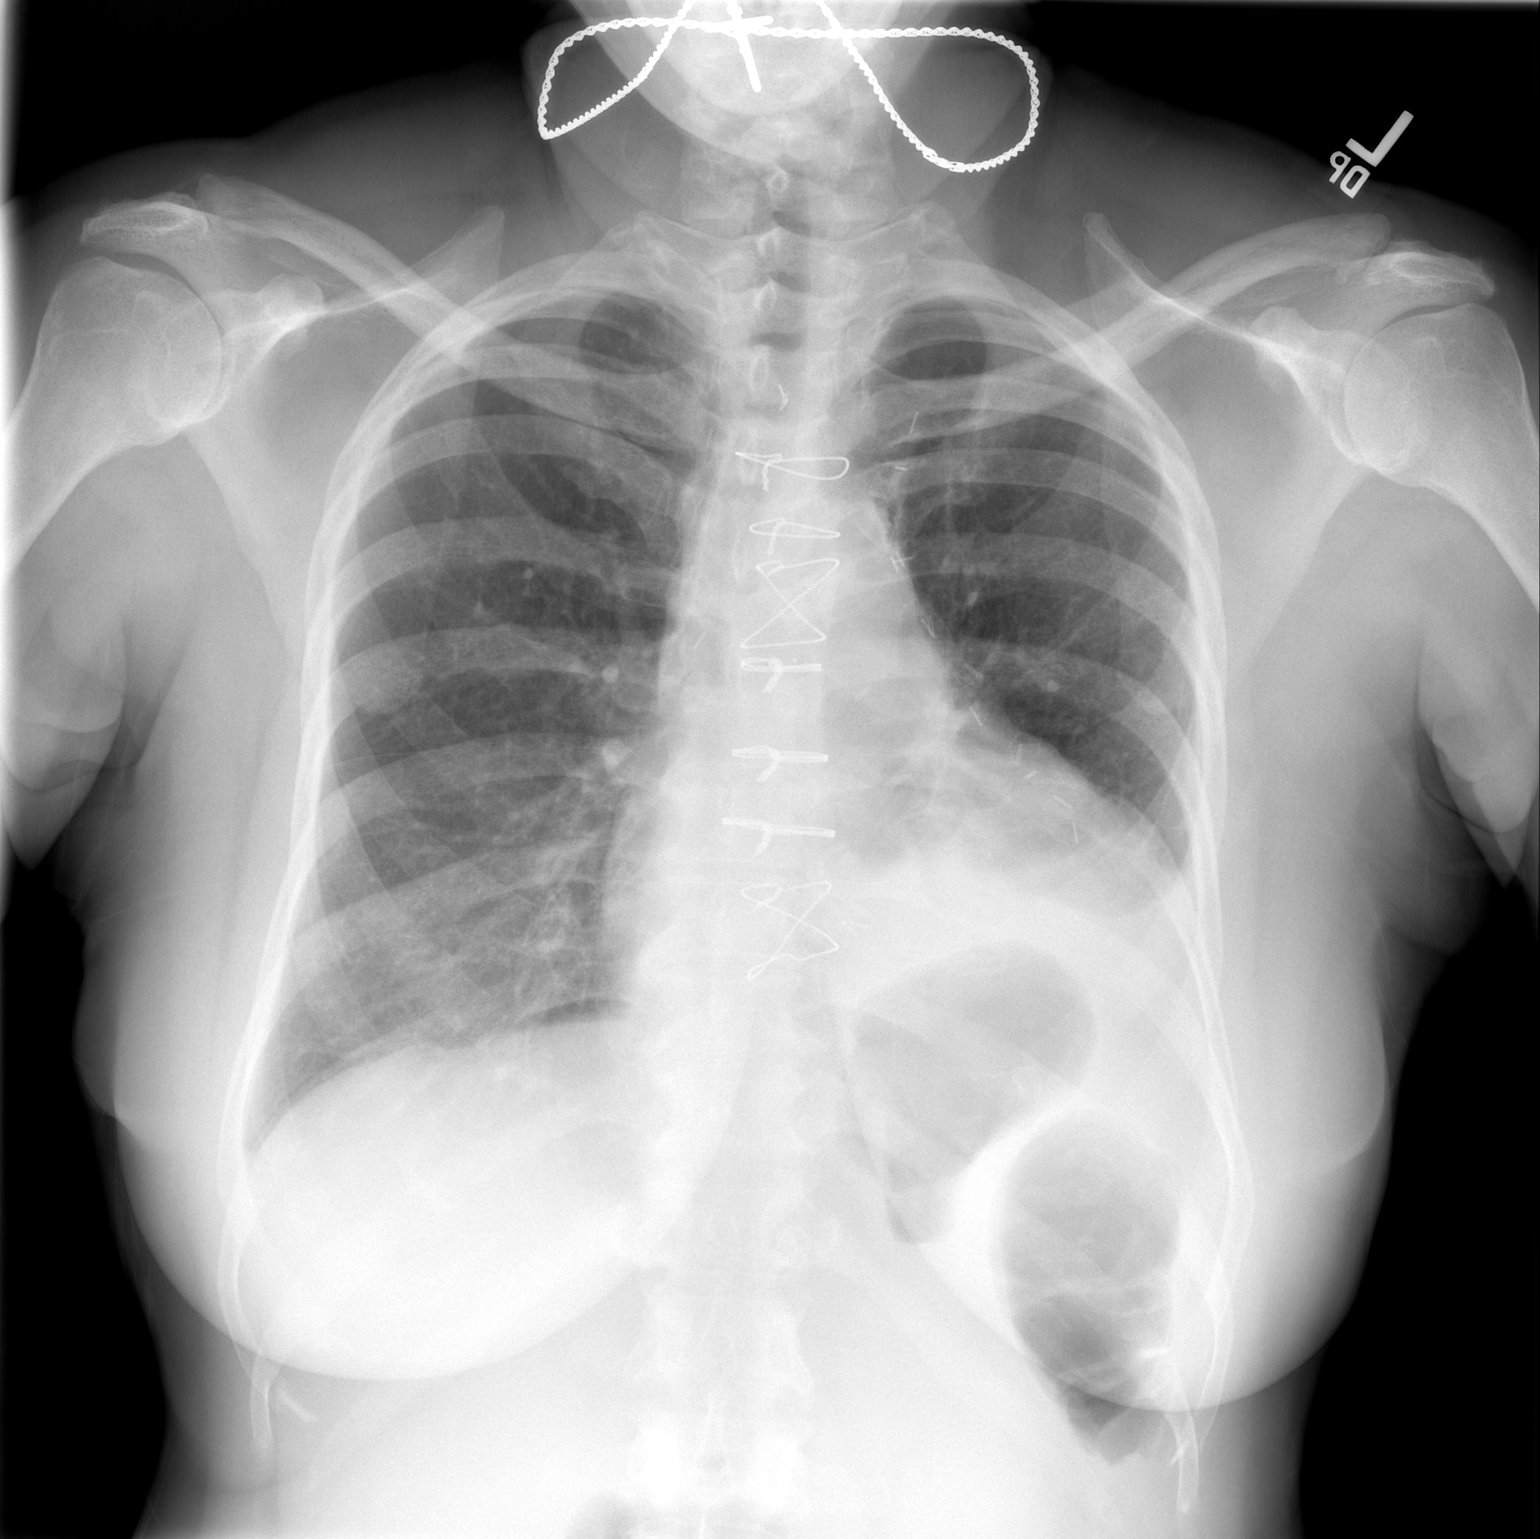

[w chest lat]
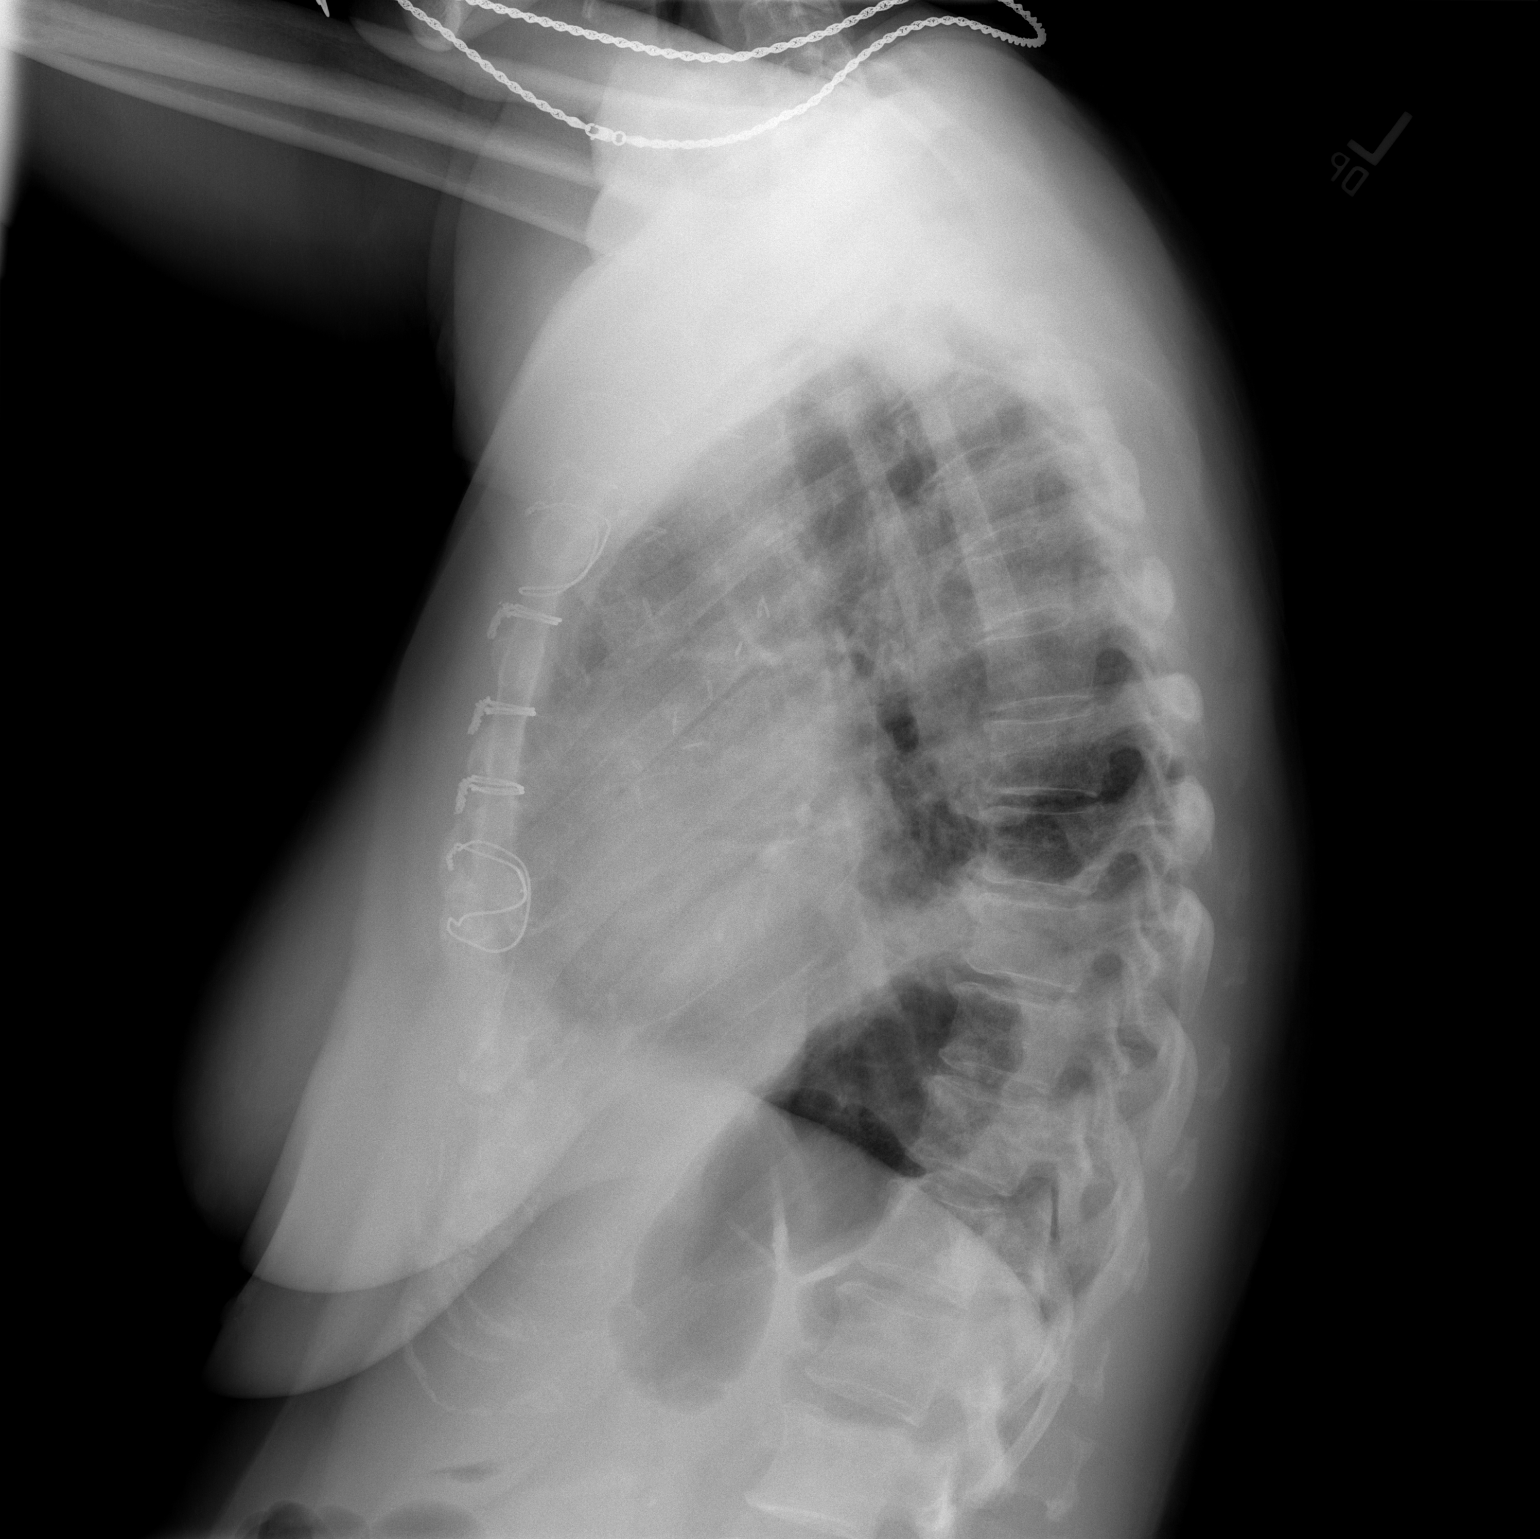

[2 of 2 positions shown; findings below may reference images not displayed]

FINDINGS: Prior CABG. Cardiomegaly. No pulmonary venous congestion. Persistent
atelectasis/infiltrate left lung base. Improved aeration from prior
exam. Persistent moderate left pleural effusion, improved from prior
exam. Mild right base infiltrate cannot be excluded. No
pneumothorax.
IMPRESSION: 1.  Prior CABG.  Cardiomegaly.  No pulmonary venous congestion.

2. Persistent but improved atelectasis/infiltrate left lung base and
moderate left pleural effusion.

2.  Mild right base infiltrate.

## 2023-04-21 ENCOUNTER — Other Ambulatory Visit: Payer: Self-pay

## 2023-04-21 ENCOUNTER — Encounter (HOSPITAL_BASED_OUTPATIENT_CLINIC_OR_DEPARTMENT_OTHER): Payer: Self-pay | Admitting: Emergency Medicine

## 2023-04-21 ENCOUNTER — Emergency Department (HOSPITAL_BASED_OUTPATIENT_CLINIC_OR_DEPARTMENT_OTHER)
Admission: EM | Admit: 2023-04-21 | Discharge: 2023-04-21 | Disposition: A | Discharge status: Home / Self Care | Attending: Emergency Medicine | Admitting: Emergency Medicine

## 2023-04-21 DIAGNOSIS — T7840XA Allergy, unspecified, initial encounter: Secondary | ICD-10-CM | POA: Insufficient documentation

## 2023-04-21 DIAGNOSIS — J449 Chronic obstructive pulmonary disease, unspecified: Secondary | ICD-10-CM | POA: Diagnosis not present

## 2023-04-21 DIAGNOSIS — Z7982 Long term (current) use of aspirin: Secondary | ICD-10-CM | POA: Diagnosis not present

## 2023-04-21 DIAGNOSIS — Z7901 Long term (current) use of anticoagulants: Secondary | ICD-10-CM | POA: Diagnosis not present

## 2023-04-21 MED ORDER — DIPHENHYDRAMINE HCL 25 MG PO CAPS
25.0000 mg | ORAL_CAPSULE | Freq: Once | ORAL | Status: AC
  Administered 2023-04-21: 25 mg via ORAL
  Filled 2023-04-21: qty 1

## 2023-04-21 MED ORDER — HYDROCORTISONE 1 % EX CREA
TOPICAL_CREAM | Freq: Two times a day (BID) | CUTANEOUS | Status: DC
  Filled 2023-04-21: qty 28

## 2023-04-21 NOTE — ED Provider Notes (Signed)
 [place] [facility] Provider Note   CSN: [id number] Arrival date & time: 04/21/23  1805     History  Chief Complaint  Patient presents with   Insect Bite    Sabrina Swanson is a 63 y.o. female.  Patient is a 63 year old female with a cardiac history status post stent, diabetes, COPD who is presenting today with a pruritic rash that started several days ago and it is gradually worsening.  It is on bilateral upper arms and on the back of her left leg.  No systemic symptoms and she denies any trouble breathing or feeling like her throat is closing.  She does report being in [place] for a funeral but does not recall being bit by anything.  The history is provided by the patient.       Home Medications Prior to Admission medications   Medication Sig Start Date End Date Taking? Authorizing Provider  albuterol (VENTOLIN HFA) 108 (90 Base) MCG/ACT inhaler Inhale 2 puffs into the lungs every 4 (four) hours as needed for shortness of breath. 08/28/22   Olalere, Adewale A, MD  aspirin 81 MG EC tablet Take 81 mg by mouth daily.    [provider]  atorvastatin (LIPITOR) 80 MG tablet Take 40 mg by mouth daily.    [provider]  carvedilol (COREG) 25 MG tablet Take 1 tablet (25 mg total) by mouth 2 (two) times daily with a meal. 05/01/20   Roddenberry, Cecille Amsterdam, PA-C  cephALEXin (KEFLEX) 500 MG capsule Take 1 capsule (500 mg total) by mouth 3 (three) times daily. 08/04/22   Rancour, Jeannett Senior, MD  clopidogrel (PLAVIX) 75 MG tablet TAKE 1 TABLET(75 MG) BY MOUTH DAILY 02/15/23   Revankar, Aundra Dubin, MD  cyclobenzaprine (FLEXERIL) 10 MG tablet Take 1 tablet (10 mg total) by mouth 2 (two) times daily as needed for muscle spasms. 06/14/22   Jeannie Fend, PA-C  dapagliflozin propanediol (FARXIGA) 10 MG TABS tablet Take 1 tablet (10 mg total) by mouth daily before breakfast. 05/01/20   Roddenberry, Cecille Amsterdam, PA-C  ergocalciferol (VITAMIN D2) 1.25  MG (50000 UT) capsule Take 50,000 Units by mouth every 7 (seven) days. 01/23/19   [provider]  fluconazole (DIFLUCAN) 150 MG tablet Take 150 mg by mouth daily. 10/29/21   [provider]  fluticasone furoate-vilanterol (BREO ELLIPTA) 100-25 MCG/ACT AEPB Inhale 1 puff into the lungs daily. 03/19/21   Cobb, Ruby Cola, NP  Fluticasone-Umeclidin-Vilant (TRELEGY ELLIPTA) 100-62.5-25 MCG/ACT AEPB Inhale 1 puff into the lungs daily. 07/16/21   Cobb, Ruby Cola, NP  Fluticasone-Umeclidin-Vilant (TRELEGY ELLIPTA) 100-62.5-25 MCG/ACT AEPB Inhale 100 mcg into the lungs daily. 07/16/21   Cobb, Ruby Cola, NP  Ipratropium-Albuterol (COMBIVENT) 20-100 MCG/ACT AERS respimat Inhale 1 puff into the lungs daily in the afternoon. 07/19/16   [provider]  levofloxacin (LEVAQUIN) 500 MG tablet Take 500 mg by mouth daily. 05/06/20   [provider]  losartan (COZAAR) 50 MG tablet Take 50 mg by mouth daily. 08/13/21   [provider]  metFORMIN (GLUCOPHAGE-XR) 500 MG 24 hr tablet Take 500 mg by mouth daily. 10/17/19   [provider]  naproxen (NAPROSYN) 500 MG tablet Take 1 tablet (500 mg total) by mouth 2 (two) times daily. 06/14/22   Jeannie Fend, PA-C  NARCAN 4 MG/0.1ML LIQD nasal spray kit Place 4 mg into the nose once. 11/22/19   [provider]  nitroGLYCERIN (NITROSTAT) 0.4 MG SL tablet Place 1 tablet (  0.4 mg total) under the tongue every 5 (five) minutes as needed for chest pain. 11/11/22   Revankar, Micael Adas, MD  Omega-3 Fatty Acids (FISH OIL) 1000 MG CAPS Take 1,000 mg by mouth daily.    [provider]  oxyCODONE-acetaminophen (PERCOCET) 10-325 MG tablet Take 1 tablet by mouth every 8 (eight) hours. 05/17/20   [provider]  phenazopyridine (PYRIDIUM) 100 MG tablet Take 1 tablet (100 mg total) by mouth 3 (three) times daily as needed for pain. 08/04/22   Rancour, Mara Seminole, MD  vitamin B-12 (CYANOCOBALAMIN) 1000 MCG tablet Take  1,000 mcg by mouth daily.    [provider]      Allergies    Acetaminophen    Review of Systems   Review of Systems  Physical Exam Updated Vital Signs BP (!) 154/79   Pulse 88   Temp 98 F (36.7 C)   Resp 15   SpO2 98%  Physical Exam Vitals and nursing note reviewed.  Cardiovascular:     Rate and Rhythm: Normal rate.  Pulmonary:     Effort: Pulmonary effort is normal.  Skin:    Findings: Rash present.     Comments: Papular erythematous rash in a small area on the right tricep area and 1 area on the left bicep.  Nothing involving the trunk.  Suspect reactive localized reaction from some type of bite.  There is no evidence of cellulitis today.  No systemic symptoms.  Neurological:     Mental Status: She is alert. Mental status is at baseline.     ED Results / Procedures / Treatments   Labs (all labs ordered are listed, but only abnormal results are displayed) Labs Reviewed - No data to display  EKG None  Radiology No results found.  Procedures Procedures    Medications Ordered in ED Medications  hydrocortisone cream 1 % ( Topical Given 04/21/23 2040)  diphenhydrAMINE (BENADRYL) capsule 25 mg (25 mg Oral Given 04/21/23 2040)    ED Course/ Medical Decision Making/ A&P                                 Medical Decision Making  Patient presenting with a localized allergic reaction.  She was given Benadryl and hydrocortisone cream.  No indication for epi or systemic steroids at this time.        Final Clinical Impression(s) / ED Diagnoses Final diagnoses:  Allergic reaction, initial encounter    Rx / DC Orders ED Discharge Orders     None         Almond Army, MD 04/21/23 2141

## 2023-04-21 NOTE — ED Triage Notes (Signed)
 Pt reports multiple insect bites to bilateral arms and thigh.  Has not seen any insects.  States she feels she was bitten while outside at a funeral yesterday.  Bites are itching, raised welts.

## 2023-04-21 NOTE — Discharge Instructions (Addendum)
 You will need to go to the store and get Benadryl and you can take 1 or 2 every 6 hours as needed for itching and rash.  Also you can use the hydrocortisone cream 2 times a day to the itchy areas.

## 2023-07-21 ENCOUNTER — Telehealth: Payer: Self-pay

## 2023-07-21 NOTE — Telephone Encounter (Signed)
 Order has been faxed back to [facility] .07/20/2023

## 2023-08-09 ENCOUNTER — Encounter: Payer: Self-pay | Admitting: Cardiology

## 2023-08-09 ENCOUNTER — Ambulatory Visit: Attending: Cardiology | Admitting: Cardiology

## 2023-08-09 VITALS — BP 144/78 | HR 76 | Ht 62.0 in | Wt 182.0 lb

## 2023-08-09 DIAGNOSIS — I10 Essential (primary) hypertension: Secondary | ICD-10-CM | POA: Diagnosis not present

## 2023-08-09 DIAGNOSIS — J431 Panlobular emphysema: Secondary | ICD-10-CM | POA: Diagnosis not present

## 2023-08-09 DIAGNOSIS — Z951 Presence of aortocoronary bypass graft: Secondary | ICD-10-CM

## 2023-08-09 DIAGNOSIS — G4733 Obstructive sleep apnea (adult) (pediatric): Secondary | ICD-10-CM

## 2023-08-09 DIAGNOSIS — I2510 Atherosclerotic heart disease of native coronary artery without angina pectoris: Secondary | ICD-10-CM | POA: Diagnosis not present

## 2023-08-09 DIAGNOSIS — E1140 Type 2 diabetes mellitus with diabetic neuropathy, unspecified: Secondary | ICD-10-CM

## 2023-08-09 DIAGNOSIS — Z720 Tobacco use: Secondary | ICD-10-CM

## 2023-08-09 NOTE — Progress Notes (Signed)
 Cardiology Office Note:    Date:  08/09/2023   ID:  Sabrina Swanson, DOB December 18, 1960, MRN [id number]  PCP:  Zena Frederick, DO  Cardiologist:  Jennifer JONELLE Crape, MD   Referring MD: Zena Frederick, DO    ASSESSMENT:    1. Coronary artery disease involving native coronary artery of native heart without angina pectoris   2. Essential hypertension   3. Panlobular emphysema (HCC)   4. Moderate obstructive sleep apnea   5. Type 2 diabetes, controlled, with neuropathy (HCC)   6. S/P CABG x 3   7. Tobacco abuse    PLAN:    In order of problems listed above:  Coronary artery disease: Post CABG surgery: Secondary prevention stressed with the patient.  Importance of compliance with diet medication stressed and patient verbalized standing.  She was advised to walk at least half an hour a day on a daily basis and she promises to do so.  She is on dual antiplatelet therapy.  I told her that she could stop aspirin . Essential hypertension: Blood pressure stable and diet was emphasized.  Lifestyle modification urged.  She checks her blood pressure at home and ranges in the range of 130/70.  She has an element of whitecoat hypertension. Mixed lipidemia: On lipid-lowering medications lipids followed by primary care.  Lipids are not at goal.  Goal LDL must be less than 60.  I cautioned her about this.  She promises to follow-up with primary care about this. Diabetes mellitus and obesity: Weight reduction stressed diet emphasized and she promises to do better.  Risks explained. Patient will be seen in follow-up appointment in 9 months or earlier if the patient has any concerns.    Medication Adjustments/Labs and Tests Ordered: Current medicines are reviewed at length with the patient today.  Concerns regarding medicines are outlined above.  No orders of the defined types were placed in this encounter.  No orders of the defined types were placed in this encounter.    No chief complaint on file.    History  of Present Illness:    Sabrina Swanson is a 63 y.o. female.  Patient has past medical history of coronary artery disease post CABG surgery, essential hypertension, mixed dyslipidemia, diabetes mellitus and obesity.  She leads a sedentary lifestyle.  She denies any chest pain orthopnea or PND.  At the time of my evaluation, the patient is alert awake oriented and in no distress.  Past Medical History:  Diagnosis Date   Abnormal stress test 04/16/2020   Acquired trigger finger 06/10/2012   Alopecia 05/16/2013   Angina pectoris (HCC) 11/13/2019   Anxiety state 06/10/2012   Arthritis of right acromioclavicular joint 11/27/2018   Back pain    Benign neoplasm of colon 06/10/2012   Bursitis disorder 05/11/2011   CAD (coronary artery disease), native coronary artery 04/22/2020   Cardiac murmur 11/13/2019   Carpal tunnel syndrome 06/10/2012   Formatting of this note might be different from the original. 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   DDD (degenerative disc disease), lumbosacral 06/10/2012   Depressive disorder 06/10/2012   Diabetes mellitus due to underlying condition with hyperosmolarity without coma, without long-term current use of insulin  (HCC) 04/16/2020   Diabetes mellitus without complication (  HCC)    Difficulty walking 06/21/2022   Dyspareunia 03/04/2014   Dysthymia 06/10/2012   Encounter for long-term (current) use of other medications 06/10/2012   Essential hypertension 08/14/2019   Former smoker 05/21/2020   Generalized anxiety disorder 06/10/2012   Hammer toe of right foot 10/23/2014   Formatting of this note might be different from the original. Second and third tarsals  Formatting of this note might be different from the original. Formatting of this note might be different from the original. Second and third tarsals   High cholesterol    Hypercholesterolemia 03/06/2013   Hypertension    Impaired mobility and ADLs 06/21/2022   Insomnia 06/10/2012   Lumbosacral spondylosis 06/10/2012   Lump of skin 10/23/2014   Formatting of this note might be different from the original. Right breast-manipulated by patient prior to clinical exam   Migraine syndrome 06/10/2012   Mixed hyperlipidemia 04/16/2020   Moderate obstructive sleep apnea 07/16/2021   Numbness of toes 10/23/2014   Osteoarthrosis, generalized, involving multiple sites 06/10/2012   Pain in soft tissues of limb 06/10/2012   Formatting of this note might be different from the original. 03/09/11 right arm.   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   Unspecified cataract 06/30/2015   Vertigo 06/14/2013   Vestibular dizziness 06/10/2012    Past Surgical History:  Procedure Laterality Date   ABDOMINAL HYSTERECTOMY     CESAREAN SECTION     CORONARY ARTERY BYPASS GRAFT N/A 04/23/2020   Procedure: CORONARY ARTERY BYPASS GRAFTING (CABG), ON PUMP, TIMES THREE, USING LEFT INTERNAL MAMMARY ARTERY AND ENDOSCOPICALLY HARVESTED RIGHT GREATER SAPHENOUS VEIN;  Surgeon: German Bartlett PEDLAR, MD;  Location: [facility] OR;  Service: Open Heart Surgery;  Laterality: N/A;   LEFT HEART CATH AND CORONARY ANGIOGRAPHY N/A 04/22/2020    Procedure: LEFT HEART CATH AND CORONARY ANGIOGRAPHY;  Surgeon: Burnard Debby LABOR, MD;  Location: [facility] INVASIVE CV LAB;  Service: Cardiovascular;  Laterality: N/A;   TEE WITHOUT CARDIOVERSION N/A 04/23/2020   Procedure: TRANSESOPHAGEAL ECHOCARDIOGRAM (TEE);  Surgeon: German Bartlett PEDLAR, MD;  Location: [facility];  Service: Open Heart Surgery;  Laterality: N/A;    Current Medications: Current Meds  Medication Sig   albuterol  (VENTOLIN  HFA) 108 (90 Base) MCG/ACT inhaler Inhale 2 puffs into the lungs every 4 (four) hours as needed for shortness of breath.   aspirin  81 MG EC tablet Take 81 mg by mouth daily.   atorvastatin  (LIPITOR ) 80 MG tablet Take 40 mg by mouth daily.   carvedilol  (COREG ) 25 MG tablet Take 1 tablet (25 mg total) by mouth 2 (two) times daily with a meal.   clopidogrel  (PLAVIX ) 75 MG tablet TAKE 1 TABLET(75 MG) BY MOUTH DAILY   cyclobenzaprine  (FLEXERIL ) 10 MG tablet Take 1 tablet (10 mg total) by mouth 2 (two) times daily as needed for muscle spasms.   dapagliflozin  propanediol (FARXIGA ) 10 MG TABS tablet Take 1 tablet (10 mg total) by mouth daily before breakfast.   ergocalciferol (VITAMIN D2) 1.25 MG (50000 UT) capsule  Take 50,000 Units by mouth every 7 (seven) days.   fluconazole (DIFLUCAN) 150 MG tablet Take 150 mg by mouth daily.   losartan  (COZAAR ) 50 MG tablet Take 50 mg by mouth daily.   metFORMIN (GLUCOPHAGE-XR) 500 MG 24 hr tablet Take 500 mg by mouth daily.   naproxen  (NAPROSYN ) 500 MG tablet Take 1 tablet (500 mg total) by mouth 2 (two) times daily.   NARCAN 4 MG/0.1ML LIQD nasal spray kit Place 4 mg into the nose once.   nitroGLYCERIN  (NITROSTAT ) 0.4 MG SL tablet Place 1 tablet (0.4 mg total) under the tongue every 5 (five) minutes as needed for chest pain.   Omega-3 Fatty Acids (FISH OIL) 1000 MG CAPS Take 1,000 mg by mouth daily.   oxyCODONE -acetaminophen  (PERCOCET) 10-325 MG tablet Take 1 tablet by mouth every 8 (eight) hours.   phenazopyridine  (PYRIDIUM ) 100 MG tablet  Take 1 tablet (100 mg total) by mouth 3 (three) times daily as needed for pain.   vitamin B-12 (CYANOCOBALAMIN) 1000 MCG tablet Take 1,000 mcg by mouth daily.   [DISCONTINUED] levofloxacin  (LEVAQUIN ) 500 MG tablet Take 500 mg by mouth daily.     Allergies:   Acetaminophen    Social History   Socioeconomic History   Marital status: Single    Spouse name: Not on file   Number of children: Not on file   Years of education: Not on file   Highest education level: Not on file  Occupational History   Not on file  Tobacco Use   Smoking status: Former    Current packs/day: 0.00    Types: Cigarettes    Quit date: 04/22/2020    Years since quitting: 3.2    Passive exposure: Past   Smokeless tobacco: Never  Vaping Use   Vaping status: Never Used  Substance and Sexual Activity   Alcohol use: Yes    Comment: occasional   Drug use: No    Comment: did eat marijuana brownie last week   Sexual activity: Not on file  Other Topics Concern   Not on file  Social History Narrative   Not on file   Social Drivers of Health   Financial Resource Strain: Not on file  Food Insecurity: Not on file  Transportation Needs: Not on file  Physical Activity: Not on file  Stress: Not on file  Social Connections: Not on file     Family History: The patient's family history includes Diabetes in her maternal grandfather; Hypertension in her maternal grandmother; Stomach cancer in her mother.  ROS:   Please see the history of present illness.    All other systems reviewed and are negative.  EKGs/Labs/Other Studies Reviewed:    The following studies were reviewed today: .SABRA   I discussed my findings with the patient at length   Recent Labs: 11/11/2022: ALT 24; BUN 12; Creatinine, Ser 0.80; Hemoglobin 14.1; Platelets 215; Potassium 4.8; Sodium 143; TSH 0.797  Recent Lipid Panel    Component Value Date/Time   CHOL 110 11/11/2022 1007   TRIG 70 11/11/2022 1007   HDL 34 (L) 11/11/2022 1007    CHOLHDL 3.2 11/11/2022 1007   CHOLHDL 5.5 05/01/2020 0025   VLDL 22 05/01/2020 0025   LDLCALC 61 11/11/2022 1007    Physical Exam:    VS:  BP (!) 144/78   Pulse 76   Ht 5' 2 (1.575 m)   Wt 182 lb 0.6 oz (82.6 kg)   SpO2 96%   BMI 33.30 kg/m  Wt Readings from Last 3 Encounters:  08/09/23 182 lb 0.6 oz (82.6 kg)  11/11/22 187 lb 1.3 oz (84.9 kg)  10/20/22 189 lb 3.2 oz (85.8 kg)     GEN: Patient is in no acute distress HEENT: Normal NECK: No JVD; No carotid bruits LYMPHATICS: No lymphadenopathy CARDIAC: Hear sounds regular, 2/6 systolic murmur at the apex. RESPIRATORY:  Clear to auscultation without rales, wheezing or rhonchi  ABDOMEN: Soft, non-tender, non-distended MUSCULOSKELETAL:  No edema; No deformity  SKIN: Warm and dry NEUROLOGIC:  Alert and oriented x 3 PSYCHIATRIC:  Normal affect   Signed, Jennifer JONELLE Crape, MD  08/09/2023 10:35 AM    [facility]

## 2023-08-09 NOTE — Patient Instructions (Signed)
Medication Instructions:  Your physician has recommended you make the following change in your medication:   Stop Aspirin  *If you need a refill on your cardiac medications before your next appointment, please call your pharmacy*   Lab Work: None ordered If you have labs (blood work) drawn today and your tests are completely normal, you will receive your results only by: MyChart Message (if you have MyChart) OR A paper copy in the mail If you have any lab test that is abnormal or we need to change your treatment, we will call you to review the results.   Testing/Procedures: None ordered   Follow-Up: At [facility], you and your health needs are our priority.  As part of our continuing mission to provide you with exceptional heart care, we have created designated Provider Care Teams.  These Care Teams include your primary Cardiologist (physician) and Advanced Practice Providers (APPs -  Physician Assistants and Nurse Practitioners) who all work together to provide you with the care you need, when you need it.  We recommend signing up for the patient portal called "MyChart".  Sign up information is provided on this After Visit Summary.  MyChart is used to connect with patients for Virtual Visits (Telemedicine).  Patients are able to view lab/test results, encounter notes, upcoming appointments, etc.  Non-urgent messages can be sent to your provider as well.   To learn more about what you can do with MyChart, go to [email or web address].    Your next appointment:   9 month(s)  The format for your next appointment:   In Person  Provider:   Belva Crome, MD    Other Instructions none  Important Information About Sugar

## 2023-09-01 ENCOUNTER — Other Ambulatory Visit: Payer: Self-pay | Admitting: Pulmonary Disease

## 2023-10-19 ENCOUNTER — Ambulatory Visit (INDEPENDENT_AMBULATORY_CARE_PROVIDER_SITE_OTHER): Admitting: Pulmonary Disease

## 2023-10-19 ENCOUNTER — Encounter: Payer: Self-pay | Admitting: Pulmonary Disease

## 2023-10-19 VITALS — BP 102/80 | HR 80 | Ht 61.0 in | Wt 183.2 lb

## 2023-10-19 DIAGNOSIS — G4733 Obstructive sleep apnea (adult) (pediatric): Secondary | ICD-10-CM

## 2023-10-19 NOTE — Patient Instructions (Addendum)
 DME referral to decrease pressure from 5-15 to 5-13  Continue using Trelegy, albuterol  as needed  Continue graded activities as tolerated  Follow-up in about 6 months  Continue oxygen supplementation as needed  Try and make sure you are using the CPAP every night - For the dryness of the mouth he can consider a chinstrap, a mouth tape with your CPAP in place may also help

## 2023-10-19 NOTE — Progress Notes (Signed)
 Sabrina Swanson    [id number]    Dec 09, 1960  Primary Care Physician:Lim, Tylene, DO  Referring Physician: Zena Tylene, DO [address] [place],  [place] [address]  Chief complaint:   In for follow-up today for shortness of breath Shortness of breath developed postcardiac surgery  HPI:  Continues to do relatively well  Has not been having any significant problems  Tries to use her CPAP regularly but does get some dryness Only able to tolerate a nasal pillow Did not tolerate a fullface mask previously  She does use Trelegy, uses albuterol  as needed  She is more active Able to tolerate more activity  Denies any chest pains or chest discomfort  PFT with obstructive lung disease with significant bronchodilator response Denies significant shortness of breath at rest  Previous chest x-ray findings showing hypoventilation, multifocal infiltrate, elevated left hemidiaphragm which has progressively improved  Reformed smoker No pertinent occupational history  chronic obstructive pulmonary disease She was not having significant shortness of breath prior to diagnosis of coronary artery disease   Outpatient Encounter Medications as of 10/19/2023  Medication Sig   albuterol  (VENTOLIN  HFA) 108 (90 Base) MCG/ACT inhaler INHALE 2 PUFFS INTO THE LUNGS EVERY 4 HOURS AS NEEDED FOR SHORTNESS OF BREATH   atorvastatin  (LIPITOR ) 80 MG tablet Take 40 mg by mouth daily.   carvedilol  (COREG ) 25 MG tablet Take 1 tablet (25 mg total) by mouth 2 (two) times daily with a meal.   clopidogrel  (PLAVIX ) 75 MG tablet TAKE 1 TABLET(75 MG) BY MOUTH DAILY   cyclobenzaprine  (FLEXERIL ) 10 MG tablet Take 1 tablet (10 mg total) by mouth 2 (two) times daily as needed for muscle spasms.   dapagliflozin  propanediol (FARXIGA ) 10 MG TABS tablet Take 1 tablet (10 mg total) by mouth daily before breakfast.   ergocalciferol (VITAMIN D2) 1.25 MG (50000 UT) capsule Take 50,000 Units by mouth every 7  (seven) days.   Fluticasone -Umeclidin-Vilant (TRELEGY ELLIPTA ) 100-62.5-25 MCG/ACT AEPB Inhale 1 puff into the lungs daily.   losartan  (COZAAR ) 50 MG tablet Take 50 mg by mouth daily.   metFORMIN (GLUCOPHAGE-XR) 500 MG 24 hr tablet Take 500 mg by mouth daily.   MOUNJARO 5 MG/0.5ML Pen Inject 5 mg into the skin once a week.   naproxen  (NAPROSYN ) 500 MG tablet Take 1 tablet (500 mg total) by mouth 2 (two) times daily.   NARCAN 4 MG/0.1ML LIQD nasal spray kit Place 4 mg into the nose once.   nitroGLYCERIN  (NITROSTAT ) 0.4 MG SL tablet Place 1 tablet (0.4 mg total) under the tongue every 5 (five) minutes as needed for chest pain.   Omega-3 Fatty Acids (FISH OIL) 1000 MG CAPS Take 1,000 mg by mouth daily.   oxyCODONE -acetaminophen  (PERCOCET) 10-325 MG tablet Take 1 tablet by mouth every 8 (eight) hours.   phenazopyridine  (PYRIDIUM ) 100 MG tablet Take 1 tablet (100 mg total) by mouth 3 (three) times daily as needed for pain.   vitamin B-12 (CYANOCOBALAMIN) 1000 MCG tablet Take 1,000 mcg by mouth daily.   [DISCONTINUED] Ipratropium-Albuterol  (COMBIVENT) 20-100 MCG/ACT AERS respimat Inhale 1 puff into the lungs daily in the afternoon.   [DISCONTINUED] fluconazole (DIFLUCAN) 150 MG tablet Take 150 mg by mouth daily.   [DISCONTINUED] fluticasone  furoate-vilanterol (BREO ELLIPTA ) 100-25 MCG/ACT AEPB Inhale 1 puff into the lungs daily.   [DISCONTINUED] Fluticasone -Umeclidin-Vilant (TRELEGY ELLIPTA ) 100-62.5-25 MCG/ACT AEPB Inhale 100 mcg into the lungs daily.   No facility-administered encounter medications on file as  of 10/19/2023.    Allergies as of 10/19/2023 - Review Complete 10/19/2023  Allergen Reaction Noted   Acetaminophen  Itching and Nausea Only 07/13/2016    Past Medical History:  Diagnosis Date   Abnormal stress test 04/16/2020   Acquired trigger finger 06/10/2012   Alopecia 05/16/2013   Angina pectoris 11/13/2019   Anxiety state 06/10/2012   Arthritis of right acromioclavicular joint  11/27/2018   Back pain    Benign neoplasm of colon 06/10/2012   Bursitis disorder 05/11/2011   CAD (coronary artery disease), native coronary artery 04/22/2020   Cardiac murmur 11/13/2019   Carpal tunnel syndrome 06/10/2012   Formatting of this note might be different from the original. 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   DDD (degenerative disc disease), lumbosacral 06/10/2012   Depressive disorder 06/10/2012   Diabetes mellitus due to underlying condition with hyperosmolarity without coma, without long-term current use of insulin  (HCC) 04/16/2020   Diabetes mellitus without complication (HCC)    Difficulty walking 06/21/2022   Dyspareunia 03/04/2014   Dysthymia 06/10/2012   Encounter for long-term (current) use of other medications 06/10/2012   Essential hypertension 08/14/2019   Former smoker 05/21/2020   Generalized anxiety disorder 06/10/2012   Hammer toe of right foot 10/23/2014   Formatting of this note might be different from the original. Second and third tarsals Formatting of this note might be different from the original. Formatting of this note might be different from the original. Second and third tarsals   High cholesterol    Hypercholesterolemia 03/06/2013   Hypertension    Impaired mobility and ADLs 06/21/2022   Insomnia 06/10/2012   Lumbosacral spondylosis 06/10/2012   Lump of skin 10/23/2014   Formatting of this note might be different from the original. Right breast-manipulated by patient prior to clinical exam   Migraine syndrome 06/10/2012   Mixed hyperlipidemia 04/16/2020   Moderate obstructive sleep apnea 07/16/2021   Numbness of toes 10/23/2014    Osteoarthrosis, generalized, involving multiple sites 06/10/2012   Pain in soft tissues of limb 06/10/2012   Formatting of this note might be different from the original. 03/09/11 right arm.   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   Unspecified cataract 06/30/2015   Vertigo 06/14/2013   Vestibular dizziness 06/10/2012    Past Surgical History:  Procedure Laterality Date   ABDOMINAL HYSTERECTOMY     CESAREAN SECTION     CORONARY ARTERY BYPASS GRAFT N/A 04/23/2020   Procedure: CORONARY ARTERY BYPASS GRAFTING (CABG), ON PUMP, TIMES THREE, USING LEFT INTERNAL MAMMARY ARTERY AND ENDOSCOPICALLY HARVESTED RIGHT GREATER SAPHENOUS VEIN;  Surgeon: German Bartlett PEDLAR, MD;  Location: [facility] OR;  Service: Open Heart Surgery;  Laterality: N/A;   LEFT HEART CATH AND CORONARY ANGIOGRAPHY N/A 04/22/2020  Procedure: LEFT HEART CATH AND CORONARY ANGIOGRAPHY;  Surgeon: Burnard Debby LABOR, MD;  Location: [facility] INVASIVE CV LAB;  Service: Cardiovascular;  Laterality: N/A;   TEE WITHOUT CARDIOVERSION N/A 04/23/2020   Procedure: TRANSESOPHAGEAL ECHOCARDIOGRAM (TEE);  Surgeon: German Bartlett PEDLAR, MD;  Location: [facility] OR;  Service: Open Heart Surgery;  Laterality: N/A;    Family History  Problem Relation Age of Onset   Stomach cancer Mother    Hypertension Maternal Grandmother    Diabetes Maternal Grandfather     Social History   Socioeconomic History   Marital status: Single    Spouse name: Not on file   Number of children: Not  on file   Years of education: Not on file   Highest education level: Not on file  Occupational History   Not on file  Tobacco Use   Smoking status: Former    Current packs/day: 0.00    Types: Cigarettes    Quit date: 04/22/2020    Years since quitting: 3.4    Passive exposure: Past   Smokeless tobacco: Never  Vaping Use   Vaping status: Never Used  Substance and Sexual Activity   Alcohol use: Yes    Comment: occasional   Drug use: No    Comment: did eat marijuana brownie last week   Sexual activity: Not on file  Other Topics Concern   Not on file  Social History Narrative   Not on file   Social Drivers of Health   Financial Resource Strain: Not on file  Food Insecurity: Not on file  Transportation Needs: Not on file  Physical Activity: Not on file  Stress: Not on file  Social Connections: Not on file  Intimate Partner Violence: Not on file    Review of Systems  Constitutional:  Negative for fatigue.  Respiratory:  Positive for shortness of breath. Negative for chest tightness.     Vitals:   10/19/23 1005  BP: 102/80  Pulse: 80  SpO2: 100%     Physical Exam Constitutional:      Appearance: She is obese.  HENT:     Head: Normocephalic.     Nose: No congestion.     Mouth/Throat:     Mouth: Mucous membranes are moist.  Eyes:     General: No scleral icterus. Cardiovascular:     Rate and Rhythm: Normal rate and regular rhythm.     Heart sounds: No murmur heard.    No friction rub.  Pulmonary:     Effort: No respiratory distress.     Breath sounds: No stridor. No wheezing or rhonchi.  Musculoskeletal:     Cervical back: No rigidity or tenderness.  Neurological:     General: No focal deficit present.     Mental Status: She is alert and oriented to person, place, and time.  Psychiatric:        Mood and Affect: Mood normal.    CPAP compliance of 11%, AutoSet 5-15 Residual AHI of 0.7   Data Reviewed:  CT scan of the chest reviewed-on  04/28/2020  Pulmonary function test reviewed and does reveal obstructive disease Severe obstructive disease with significant bronchodilator response  Assessment:  Shortness of breath on exertion - Symptoms feel a little bit better  Severe obstructive lung disease Chronic obstructive pulmonary disease - Continue Trelegy - Continue albuterol  as needed  History of coronary artery disease diastolic heart failure Diaphragmatic dysfunction -Appears to be doing much better - Will follow  Moderate obstructive sleep apnea - Has not  been very compliant with CPAP use but states she is trying - Encouraged to continue using CPAP on nightly basis   Plan: Encouraged to continue using CPAP on a nightly basis  We will make adjustment to CPAP pressures from 5-15 to 5-13  Encouraged to try .  Continue Trelegy  .  Oxygen supplementation as needed  .  Follow-up in 6 months   Jennet Epley MD [facility] 10/19/2023, 10:09 AM  CC: Zena Frederick, DO
